# Patient Record
Sex: Male | Born: 1940 | Race: White | Hispanic: No | Marital: Single | Smoking: Never smoker
Health system: Southern US, Community
[De-identification: ages and names within clinical notes are randomized; demographics above are authoritative.]

## PROBLEM LIST (undated history)

## (undated) DIAGNOSIS — I10 Essential (primary) hypertension: Secondary | ICD-10-CM

## (undated) DIAGNOSIS — M069 Rheumatoid arthritis, unspecified: Secondary | ICD-10-CM

## (undated) DIAGNOSIS — E079 Disorder of thyroid, unspecified: Secondary | ICD-10-CM

## (undated) DIAGNOSIS — M1990 Unspecified osteoarthritis, unspecified site: Secondary | ICD-10-CM

## (undated) DIAGNOSIS — K219 Gastro-esophageal reflux disease without esophagitis: Secondary | ICD-10-CM

## (undated) DIAGNOSIS — E785 Hyperlipidemia, unspecified: Secondary | ICD-10-CM

## (undated) HISTORY — DX: Rheumatoid arthritis, unspecified: M06.9

## (undated) HISTORY — DX: Gastro-esophageal reflux disease without esophagitis: K21.9

## (undated) HISTORY — PX: CARDIAC SURGERY: SHX584

## (undated) HISTORY — DX: Unspecified osteoarthritis, unspecified site: M19.90

---

## 1985-10-26 HISTORY — PX: TYMPANOPLASTY: SHX33

## 1987-10-27 HISTORY — PX: OTHER SURGICAL HISTORY: SHX169

## 2008-03-28 ENCOUNTER — Ambulatory Visit: Payer: Self-pay | Admitting: Emergency Medicine

## 2008-03-30 ENCOUNTER — Ambulatory Visit: Payer: Self-pay | Admitting: Family Medicine

## 2012-02-01 ENCOUNTER — Ambulatory Visit: Payer: Self-pay

## 2014-10-26 HISTORY — PX: SHOULDER SURGERY: SHX246

## 2015-06-05 ENCOUNTER — Encounter: Payer: Self-pay | Admitting: Emergency Medicine

## 2015-06-05 ENCOUNTER — Ambulatory Visit
Admission: EM | Admit: 2015-06-05 | Discharge: 2015-06-05 | Disposition: A | Discharge status: Home / Self Care | Payer: Medicare PPO | Attending: Internal Medicine | Admitting: Internal Medicine

## 2015-06-05 DIAGNOSIS — S61210A Laceration without foreign body of right index finger without damage to nail, initial encounter: Secondary | ICD-10-CM | POA: Diagnosis not present

## 2015-06-05 HISTORY — DX: Disorder of thyroid, unspecified: E07.9

## 2015-06-05 HISTORY — DX: Hyperlipidemia, unspecified: E78.5

## 2015-06-05 HISTORY — DX: Essential (primary) hypertension: I10

## 2015-06-05 MED ORDER — TETANUS-DIPHTH-ACELL PERTUSSIS 5-2.5-18.5 LF-MCG/0.5 IM SUSP
0.5000 mL | Freq: Once | INTRAMUSCULAR | Status: AC
  Administered 2015-06-05: 0.5 mL via INTRAMUSCULAR

## 2015-06-05 MED ORDER — LIDOCAINE-EPINEPHRINE-TETRACAINE (LET) SOLUTION: 3.0000 mL | Freq: Once | NASAL | Status: DC

## 2015-06-05 NOTE — ED Notes (Signed)
Right index finger cut today.

## 2015-06-05 NOTE — ED Provider Notes (Signed)
CSN: [id number]     Arrival date & time 06/05/15  1821 History   First MD Initiated Contact with Patient 06/05/15 1911     Chief Complaint  Patient presents with  . Finger Injury   HPI  73yo patient presents with distal R 2nd finger avulsion injury, was cutting cucumbers with a mandolin to make pickles.  Also inadvertently removed some of distal R 3rd fingernail. Has been holding pressure; bleeding has slowed.  Past Medical History  Diagnosis Date  . Hypertension   . Hyperlipemia   . Thyroid disease    Past Surgical History  Procedure Laterality Date  . Cardiac surgery    . Tympanoplasty Right   . Vocal cords      Social History  Substance Use Topics  . Smoking status: Never Smoker   . Smokeless tobacco: Never Used  . Alcohol Use: 0.6 oz/week    1 Glasses of wine per week    Review of Systems  All other systems reviewed and are negative.   Allergies  Prednisone  Home Medications    BP 142/72 mmHg  Pulse 82  Temp(Src) 97.8 F (36.6 C) (Tympanic)  Resp 18  Ht 6\' 3"  (1.905 m)  Wt 205 lb (92.987 kg)  BMI 25.62 kg/m2  SpO2 96% Physical Exam  Constitutional: He is oriented to person, place, and time. No distress.  Alert, nicely groomed  HENT:  Head: Atraumatic.  Eyes:  Conjugate gaze, no eye redness/drainage  Neck: Neck supple.  Cardiovascular: Normal rate.   Pulmonary/Chest: No respiratory distress.  Abdominal: Soft. He exhibits no distension.  Musculoskeletal: Normal range of motion.  Neurological: He is alert and oriented to person, place, and time.  Skin: Skin is warm and dry.  No cyanosis Distal right finger tip with moderate avulsion, no exposed bone, surface is bleeding but not briskly, size of avulsion is about 4 x 8 mm, avulsion is triangular in shape and abuts the nail, but does not involve it  Nursing note and vitals reviewed.   ED Course  Procedures  Medications  lidocaine-EPINEPHrine-tetracaine (LET) solution   Tdap (BOOSTRIX) injection  0.5 mL (0.5 mLs Intramuscular Given 06/05/15 1858)  Surgicel   Finger injury was cleaned and inspected.  LET applied to facilitate hemostasis; small piece of surgicel applied to wound after hemostasis was achieved.  Dry dressing applied by clinical staff.  MDM   1. Laceration of right index finger w/o foreign body w/o damage to nail, initial encounter    Leave dressing on until tomorrow. Wash gently with soap/water daily, cover with bandaid.   Recheck for increasing redness/swelling/pain/drainage, new fever >100.5.    Sherlene Shams, MD 06/05/15 [phone]

## 2015-06-05 NOTE — Discharge Instructions (Signed)
A tetanus booster was given at the urgent care today.  Finger Avulsion  When the tip of the finger is lost, a new nail may grow back if part of the fingernail is left. The new nail may be deformed. If just the tip of the finger is lost, no repair may be needed unless there is bone showing. If bone is showing, your caregiver may need to remove the protruding bone and put on a bandage. Your caregiver will do what is best for you. Most of the time when a fingertip is lost, the end will gradually grow back on and look fairly normal, but it may remain sensitive to pressure and temperature extremes for a long time. HOME CARE INSTRUCTIONS   Keep your hand elevated above your heart to relieve pain and swelling.  Keep your dressing dry and clean.  Change your bandage in 24 hours or as directed.  Only take over-the-counter or prescription medicines for pain, discomfort, or fever as directed by your caregiver.  See your caregiver as needed for problems. SEEK MEDICAL CARE IF:   You have increased pain, swelling, drainage, or bleeding.  You have a fever.  You have swelling that spreads from your finger and into your hand. Make sure to check to see if you need a tetanus booster. Document Released: 12/21/2001 Document Revised: 04/12/2012 Document Reviewed: 11/15/2008 [facility] Patient Information 2015 [place], [place]. This information is not intended to replace advice given to you by your health care provider. Make sure you discuss any questions you have with your health care provider.

## 2015-08-19 ENCOUNTER — Ambulatory Visit
Admission: EM | Admit: 2015-08-19 | Discharge: 2015-08-19 | Disposition: A | Discharge status: Home / Self Care | Payer: Worker's Compensation | Attending: Family Medicine | Admitting: Family Medicine

## 2015-08-19 ENCOUNTER — Ambulatory Visit (INDEPENDENT_AMBULATORY_CARE_PROVIDER_SITE_OTHER): Payer: Worker's Compensation

## 2015-08-19 ENCOUNTER — Encounter: Payer: Self-pay | Admitting: Emergency Medicine

## 2015-08-19 DIAGNOSIS — S43004A Unspecified dislocation of right shoulder joint, initial encounter: Secondary | ICD-10-CM

## 2015-08-19 DIAGNOSIS — S61212A Laceration without foreign body of right middle finger without damage to nail, initial encounter: Secondary | ICD-10-CM

## 2015-08-19 DIAGNOSIS — S60519A Abrasion of unspecified hand, initial encounter: Secondary | ICD-10-CM

## 2015-08-19 IMAGING — CR DG SHOULDER 2+V*R*
3 series · 3 of 3 positions shown · non-contrast
Comparison: None

CLINICAL DATA: Pt fell walking down wooded trail this am and
slipped on acorns injuring rt shoulder/ humerus. Most pain whole arm
but more mid post humerus

EXAM:
RIGHT SHOULDER - 2+ VIEW

[shoulder axial (1 of 3)]
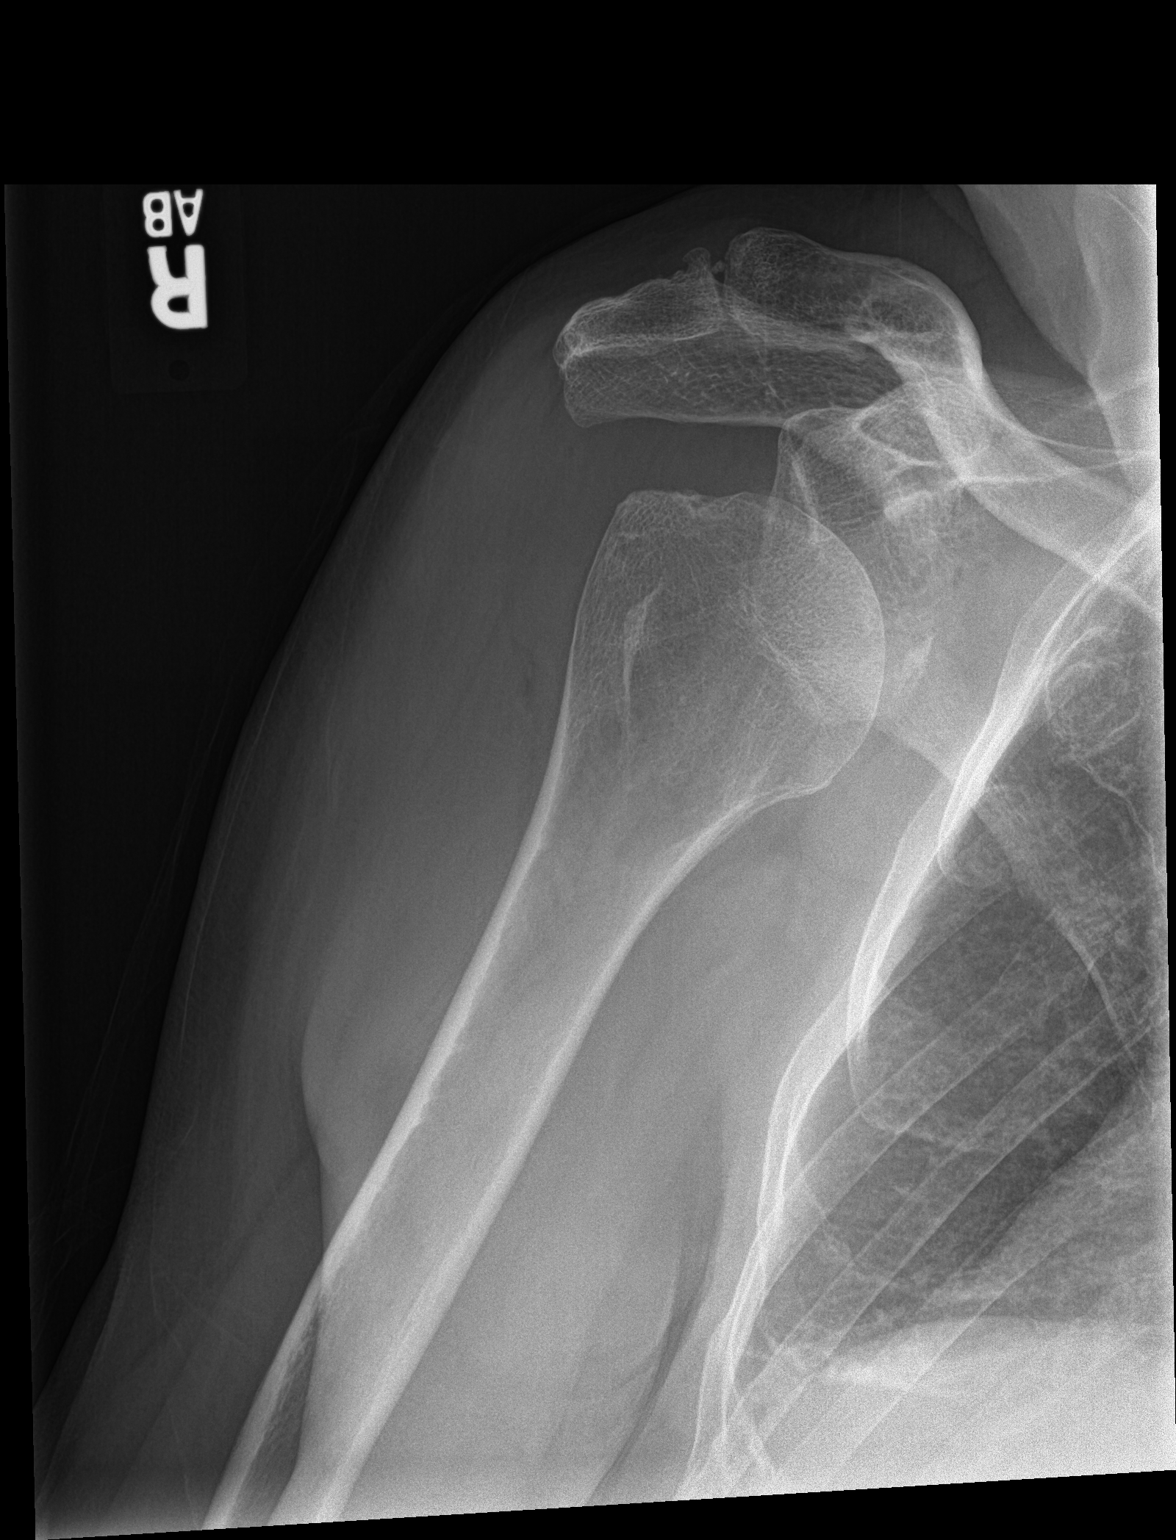

[shoulder axial (2 of 3)]
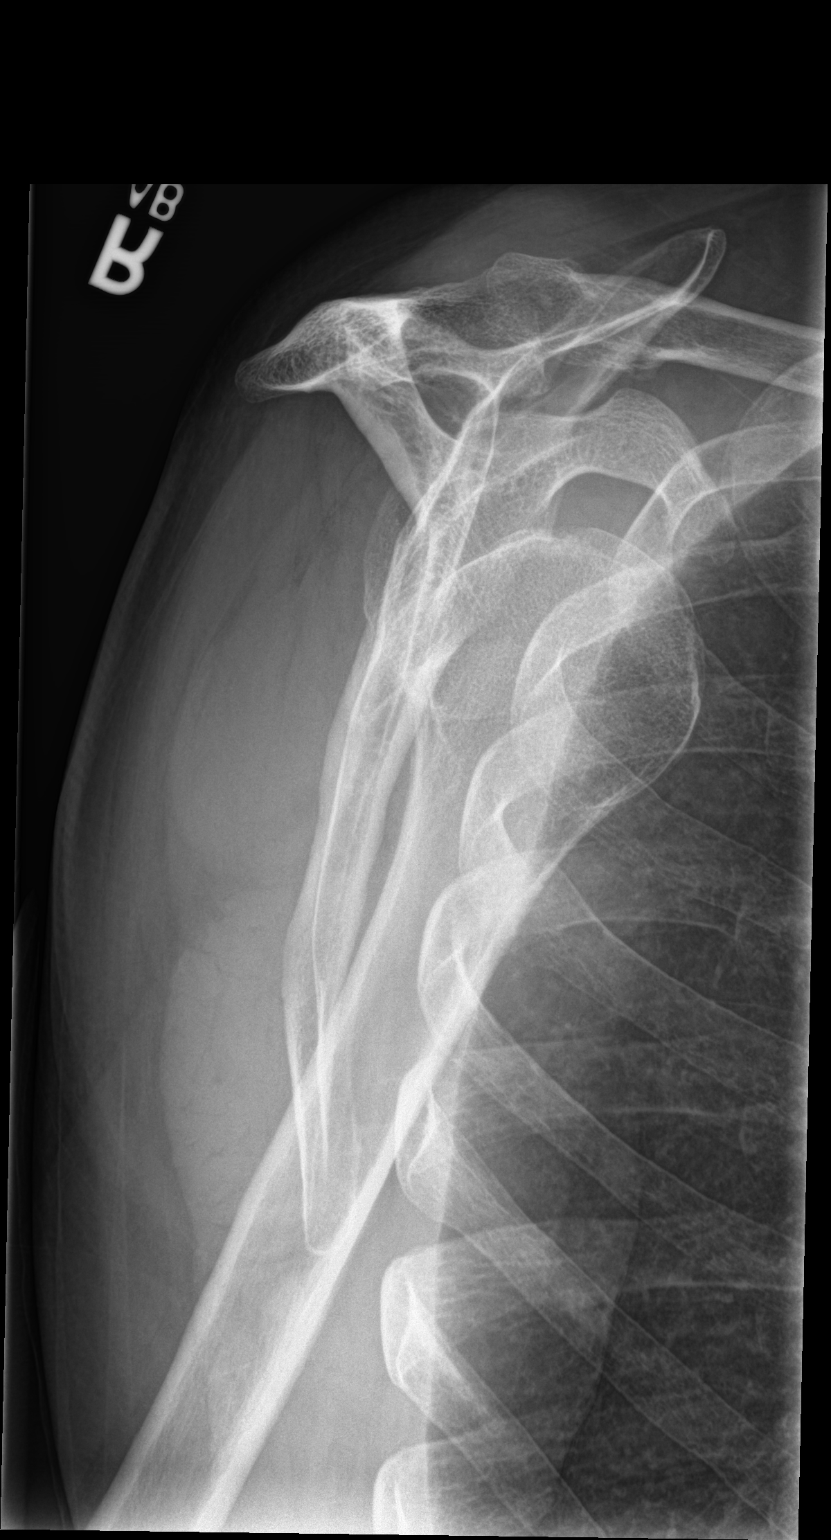

[shoulder axial (3 of 3)]
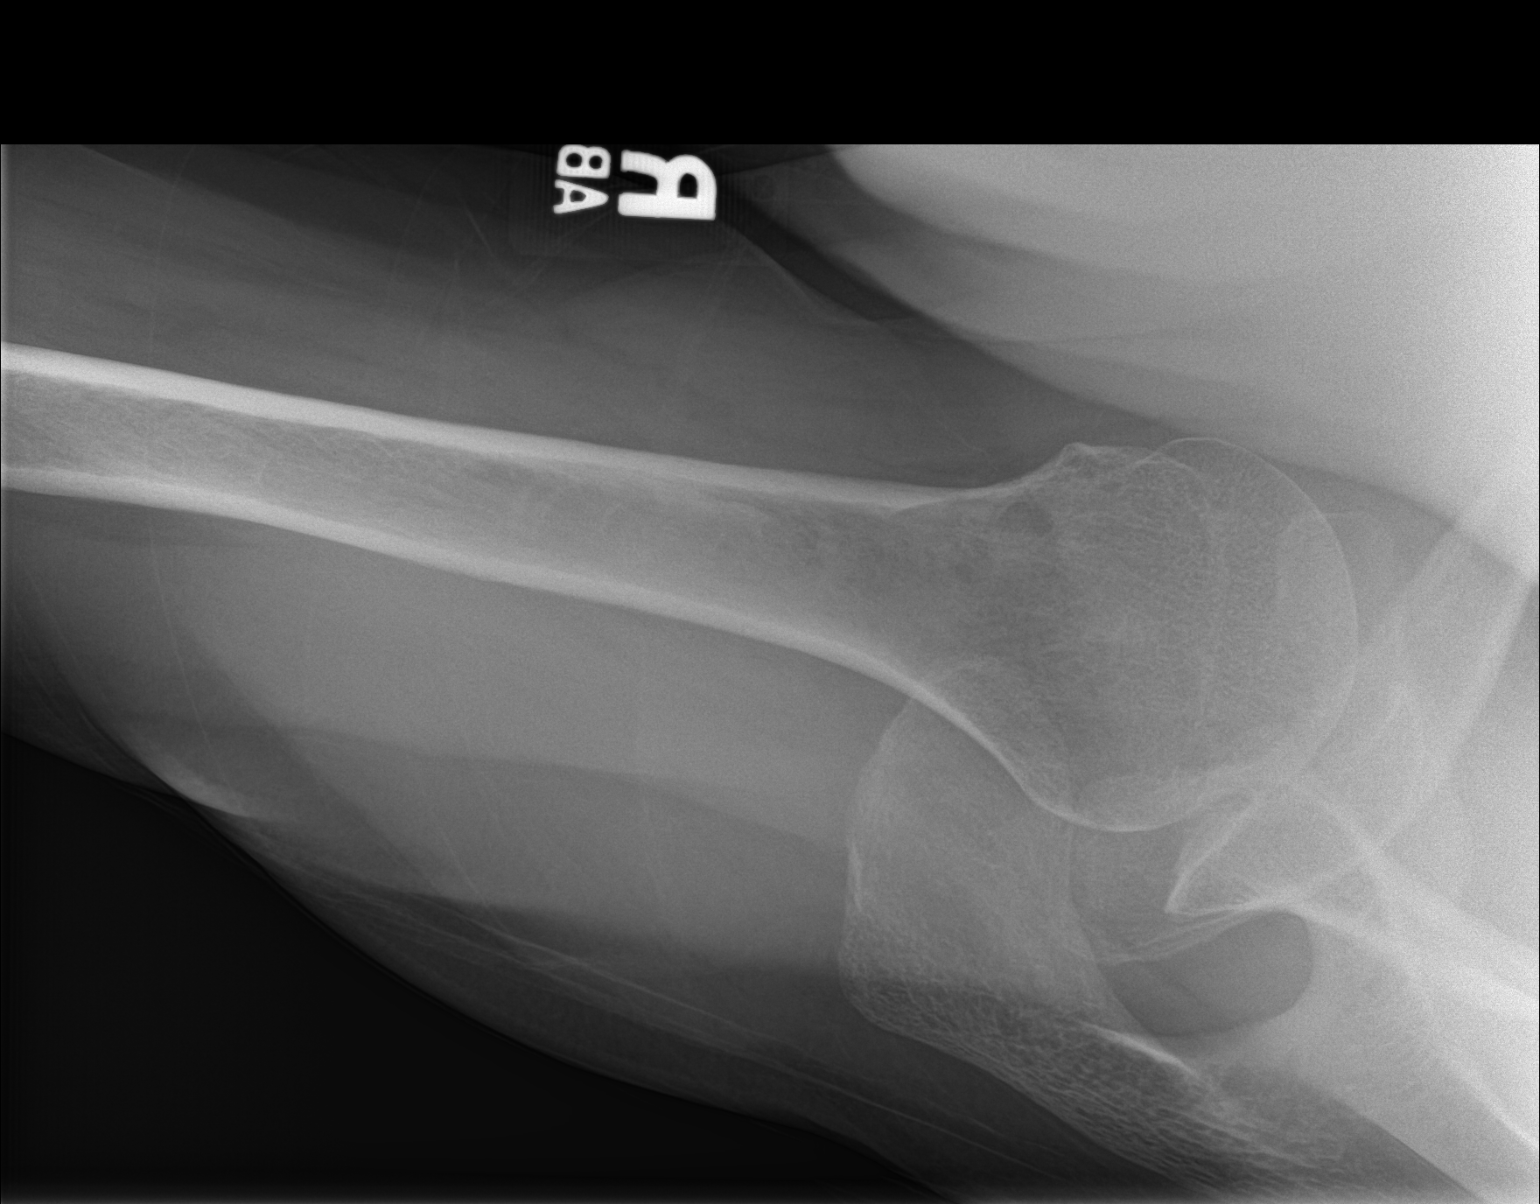

[3 of 3 positions shown; findings below may reference images not displayed]

FINDINGS: Right shoulder is dislocated. Humeral head is dislocated anterior
and slightly inferior in relation to the glenoid. No fracture is
seen.

AC joint is normally aligned. There are mild AC joint osteoarthritic
changes.

Soft tissues are unremarkable.
IMPRESSION: 1. Anterior inferior dislocation right humeral head.
2. No fracture.

## 2015-08-19 IMAGING — CR DG HUMERUS 2V *R*
4 series · 4 of 4 positions shown · non-contrast
Comparison: None.

CLINICAL DATA: Right mid upper arm pain following a fall on a trail
in the woods this morning.

EXAM:
RIGHT HUMERUS - 2+ VIEW

[humerus ap (1 of 2)]
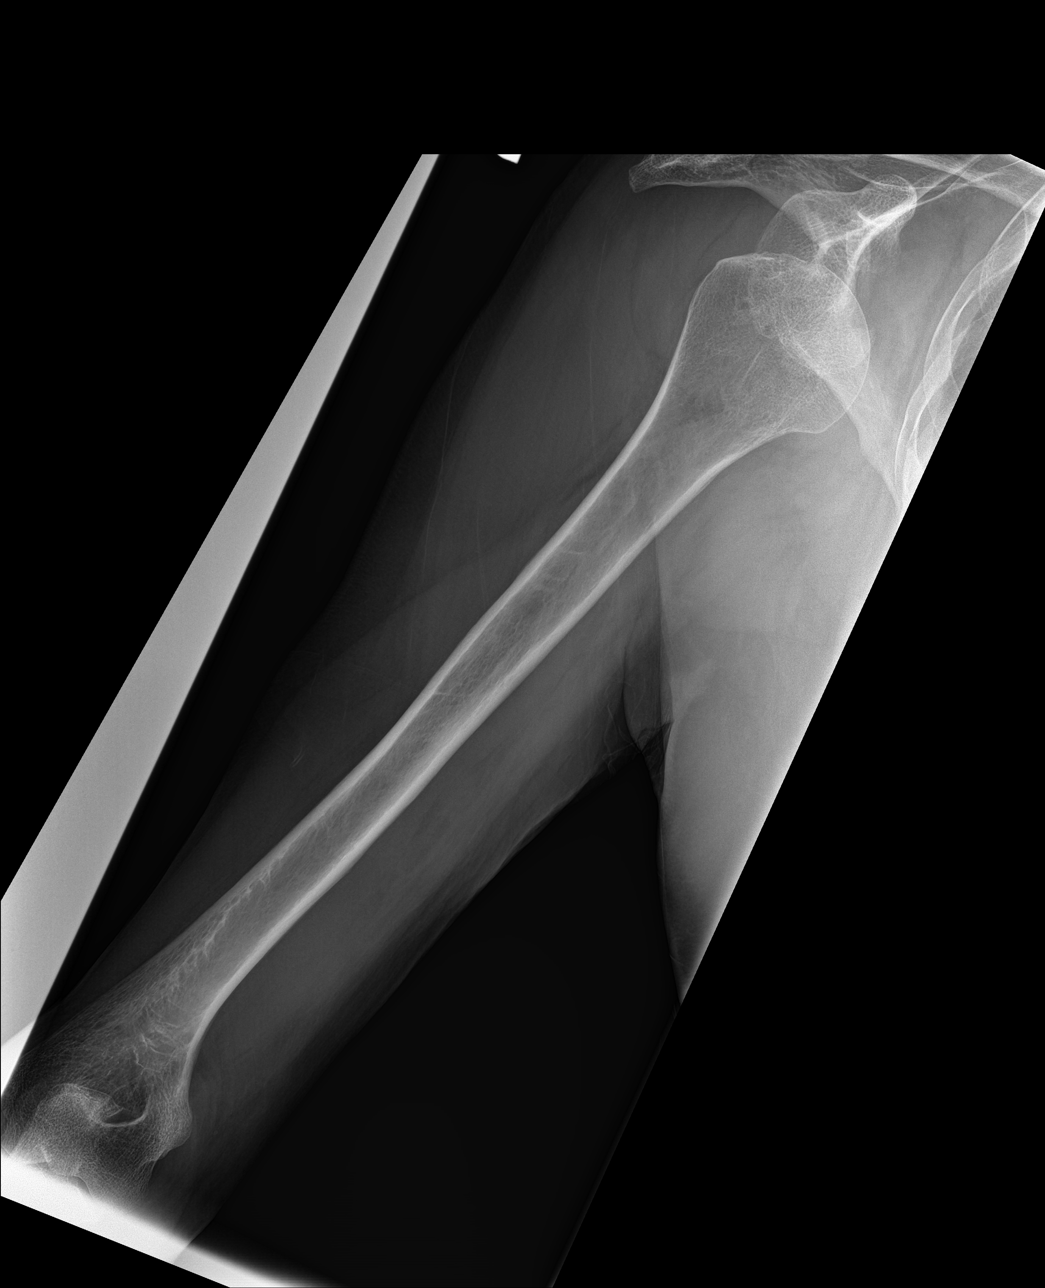

[humerus lat (1 of 2)]
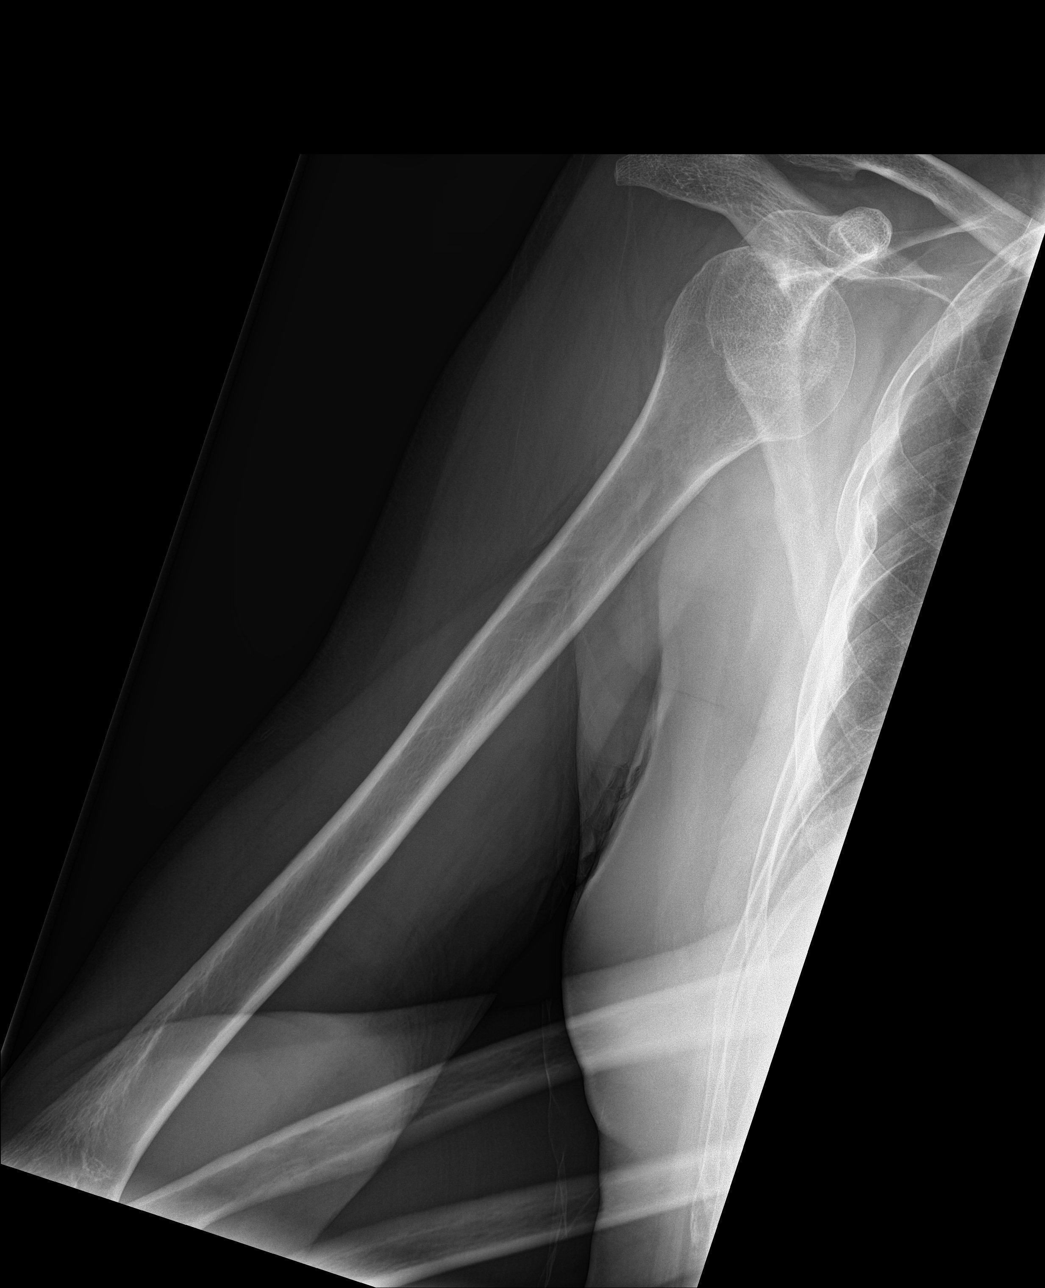

[humerus ap (2 of 2)]
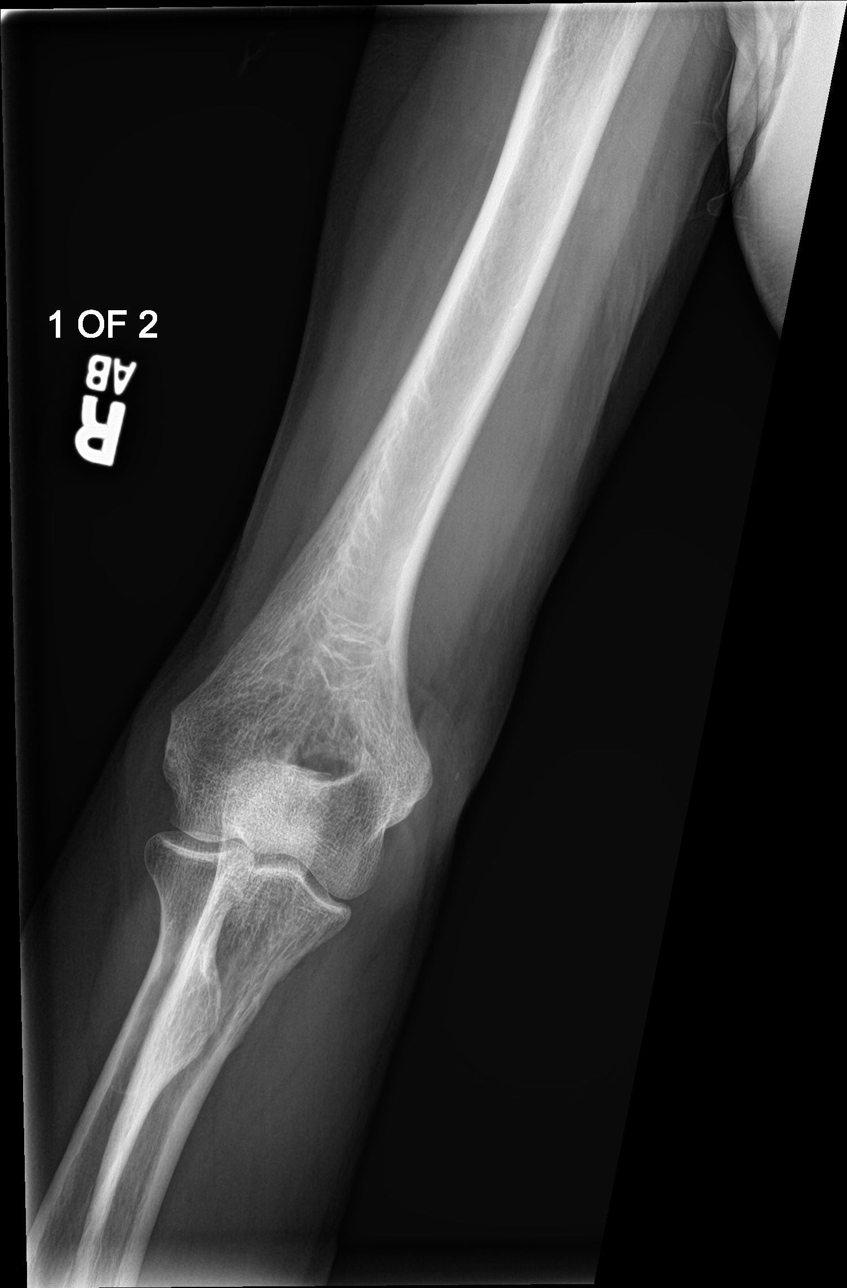

[humerus lat (2 of 2)]
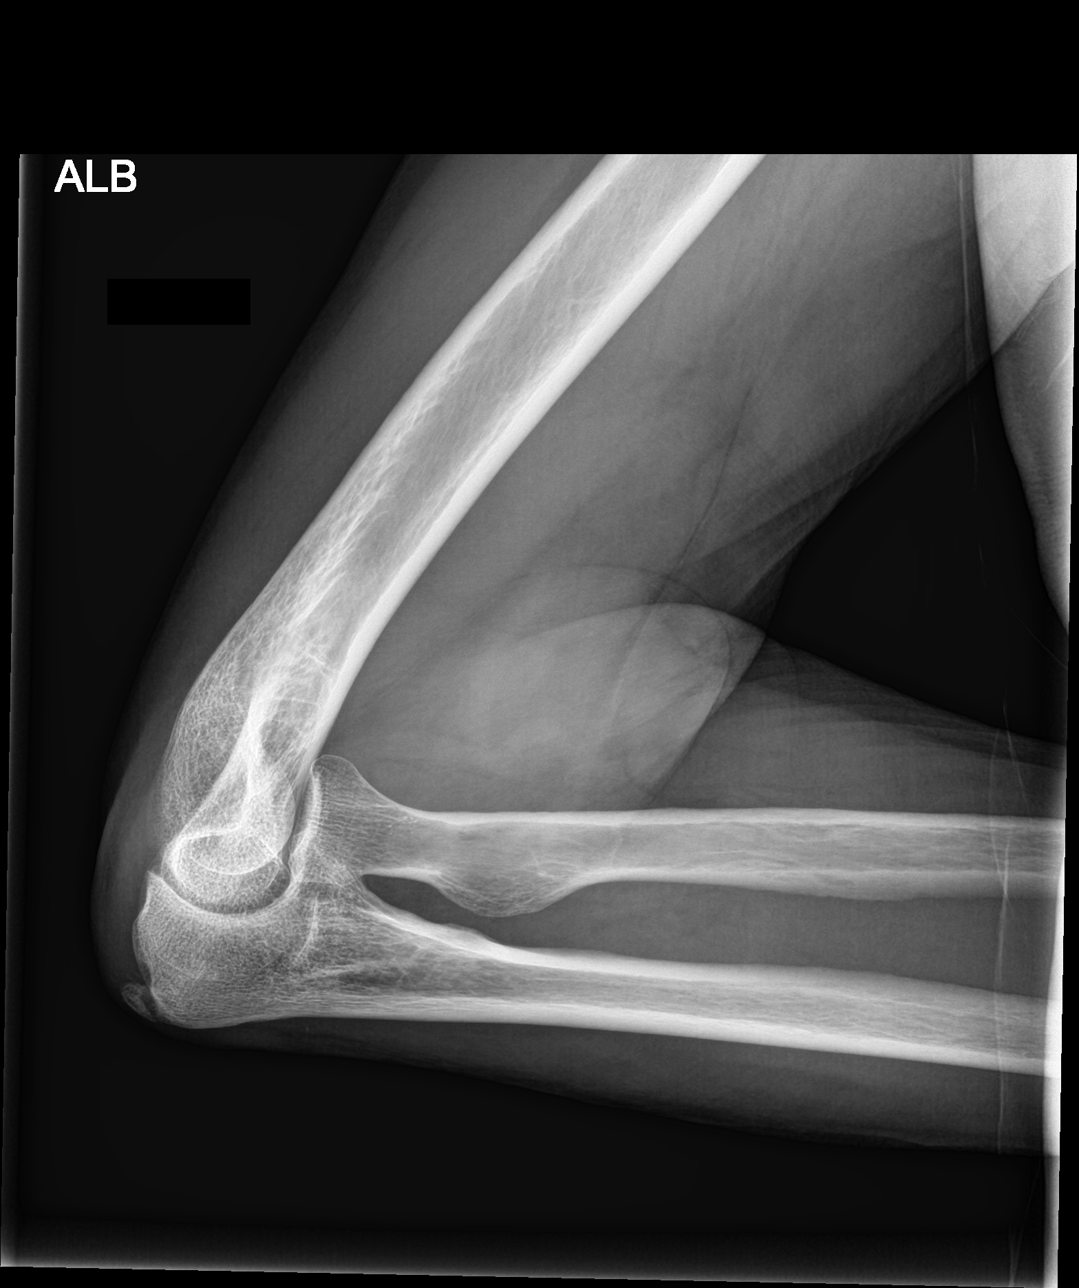

[4 of 4 positions shown; findings below may reference images not displayed]

FINDINGS: The humeral head is inferiorly and medially positioned relative to
the glenoid. No fracture seen.
IMPRESSION: Findings compatible with anterior shoulder dislocation. Dedicated
right shoulder radiographs are recommended.

## 2015-08-19 MED ORDER — KETOROLAC TROMETHAMINE 60 MG/2ML IM SOLN
60.0000 mg | Freq: Once | INTRAMUSCULAR | Status: AC
  Administered 2015-08-19: 60 mg via INTRAMUSCULAR

## 2015-08-19 NOTE — ED Notes (Signed)
Pt with a fall abrasions to hands and pain right u8pper arm

## 2015-08-19 NOTE — ED Provider Notes (Signed)
CSN: [id number]     Arrival date & time 08/19/15  4235 History   First MD Initiated Contact with Patient 08/19/15 1038   Nurses notes were reviewed. Chief Complaint  Patient presents with  . Abrasion   patient reports that he fell this morning going into work he placed in after he fell. States he found a: On the walkway landed on both hands and has pain in his right shoulder though initially he just complained about the abrasions on his hands.  (Consider location/radiation/quality/duration/timing/severity/associated sxs/prior Treatment) Patient is a 74 y.o. male presenting with shoulder pain and hand injury. The history is provided by the patient. No language interpreter was used.  Shoulder Pain Location:  Shoulder and hand Time since incident:  2 hours Injury: yes   Mechanism of injury: fall   Fall:    Fall occurred:  Walking and tripped   [employer] of impact:  Outstretched arms   Entrapped after fall: no   Shoulder location:  R shoulder Hand location:  R hand and L hand Pain details:    Quality:  Shooting, throbbing and sharp   Radiates to:  Does not radiate   Severity:  Moderate   Timing:  Constant   Progression:  Worsening Chronicity:  New Handedness:  Right-handed Dislocation: no   Foreign body present:  No foreign bodies Tetanus status:  Up to date Prior injury to area:  Yes Relieved by:  Nothing Worsened by:  Movement Ineffective treatments:  None tried Associated symptoms: decreased range of motion, stiffness and swelling   Risk factors: no concern for non-accidental trauma   Hand Injury Location:  Hand Hand location:  R palm and L palm Pain details:    Quality:  Aching and throbbing   Radiates to:  Does not radiate   Onset quality:  Sudden   Progression:  Unchanged Chronicity:  New Handedness:  Right-handed Tetanus status:  Up to date Relieved by:  Nothing Ineffective treatments:  None tried Associated symptoms: decreased range of motion, stiffness and swelling      Past Medical History  Diagnosis Date  . Hypertension   . Hyperlipemia   . Thyroid disease    Past Surgical History  Procedure Laterality Date  . Cardiac surgery    . Tympanoplasty Right   . Vocal cords     History reviewed. No pertinent family history. Social History  Substance Use Topics  . Smoking status: Never Smoker   . Smokeless tobacco: Never Used  . Alcohol Use: 0.6 oz/week    1 Glasses of wine per week    Review of Systems  Musculoskeletal: Positive for stiffness. Negative for myalgias.  Skin: Positive for wound.  Neurological: Negative for dizziness and facial asymmetry.  All other systems reviewed and are negative.   Allergies  Prednisone  Home Medications   Prior to Admission medications   Medication Sig Start Date End Date Taking? Authorizing Provider  allopurinol (ZYLOPRIM) 300 MG tablet Take 300 mg by mouth daily.    Historical Provider, MD  amLODipine-benazepril (LOTREL) 10-20 MG per capsule Take 1 capsule by mouth daily.    Historical Provider, MD  citalopram (CELEXA) 10 MG tablet Take 10 mg by mouth daily.    Historical Provider, MD  levothyroxine (SYNTHROID, LEVOTHROID) 50 MCG tablet Take 50 mcg by mouth daily before breakfast.    Historical Provider, MD  pravastatin (PRAVACHOL) 20 MG tablet Take 20 mg by mouth daily.    Historical Provider, MD   Meds Ordered and Administered this  Visit   Medications  ketorolac (TORADOL) injection 60 mg (not administered)    BP 136/67 mmHg  Pulse 69  Temp(Src) 98.6 F (37 C) (Tympanic)  Resp 20  Ht 6\' 3"  (1.905 m)  Wt 198 lb (89.812 kg)  BMI 24.75 kg/m2  SpO2 99% No data found.   Physical Exam  Constitutional: He is oriented to person, place, and time. He appears well-developed.  HENT:  Head: Normocephalic and atraumatic.  Eyes: Pupils are equal, round, and reactive to light.  Musculoskeletal: He exhibits tenderness.       Right shoulder: He exhibits decreased range of motion, tenderness,  deformity, pain and spasm.       Arms:      Right hand: He exhibits laceration.       Left hand: He exhibits tenderness.       Hands: Over the palmar surface of left hand and some abrasions present but good range of motion.  Neurological: He is alert and oriented to person, place, and time.  Skin: Skin is warm and dry.  Psychiatric: He has a normal mood and affect. His behavior is normal.  Vitals reviewed.   ED Course  Procedures (including critical care time)  Labs Review Labs Reviewed - No data to display  Imaging Review Dg Shoulder Right  08/19/2015  CLINICAL DATA:  Pt fell walking down wooded trail this am and slipped on acorns injuring rt shoulder/ humerus. Most pain whole arm but more mid post humerus EXAM: RIGHT SHOULDER - 2+ VIEW COMPARISON:  None FINDINGS: Right shoulder is dislocated. Humeral head is dislocated anterior and slightly inferior in relation to the glenoid. No fracture is seen. AC joint is normally aligned. 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 Soft tissues are unremarkable. IMPRESSION: 1. Anterior inferior dislocation right humeral head. 2. No fracture. Electronically Signed   By: Lajean Manes M.D.   On: 08/19/2015 11:10   Dg Humerus Right  08/19/2015  CLINICAL DATA:  Right mid upper arm pain following a fall on a trail in the woods this morning. EXAM: RIGHT HUMERUS - 2+ VIEW COMPARISON:  None. FINDINGS: The humeral head is inferiorly and medially positioned relative to the glenoid. No fracture seen. IMPRESSION: Findings compatible with anterior shoulder dislocation. Dedicated right shoulder radiographs are recommended. Electronically Signed   By: Claudie Revering M.D.   On: 08/19/2015 10:58     Visual Acuity Review  Right Eye Distance:   Left Eye Distance:   Bilateral Distance:    Right Eye Near:   Left Eye Near:    Bilateral Near:         MDM   1. Shoulder dislocation, right, initial encounter   2. Abrasion of hand, unspecified laterality,  initial encounter   3. Laceration of middle finger of right hand without complication, initial encounter    Reviewing patient's x-rays shows on the humerus possible dislocation. Orders were placed for complete shoulder. Radiologist also agreed that the right shoulder is dislocated. Patient will be sent to you ED his choices [facility] where he's had multiple treatments before. One of his coworkers/supervisors is agreed to dry him once he checks the workplace and gets clearance. Will Mr. 44 Toradol for pain. Place him in a sling. Copies of his disc were also given to him as well. He states his last tetanus was about a year ago which she thinks was here. He also has abrasions on his hand and a laceration on his middle finger which will be  deferred to the ED as well.    Frederich Cha, MD 08/19/15 [phone]

## 2015-08-19 NOTE — Discharge Instructions (Signed)
Shoulder Dislocation Your shoulder joint is made up of 3 bones:  The upper arm bone (humerus).  The shoulder blade (scapula).  The collarbone (clavicle). A shoulder dislocation happens when your upper arm bone moves out of its normal place in your shoulder joint. HOME CARE If You Have a Splint or Sling:  Wear it as told by your doctor.  Take it off only as told by your doctor.  Loosen it if:  Your fingers become numb and tingly.  Your fingers turn cold and blue.  Keep it clean and dry. Bathing  Do not take baths, swim, or use a hot tub until your doctor says you can. Ask your doctor if you can take showers. You may only be allowed to take sponge baths.  If your doctor says taking baths or showers is okay, cover your splint or sling with a plastic bag. Do not let the splint or sling get wet. Managing Pain, Stiffness, and Swelling  If told, put ice on the injured area.  Put ice in a plastic bag.  Place a towel between your skin and the bag.  Leave the ice on for 20 minutes, 2-3 times per day.  Move your fingers often to avoid stiffness and to lessen swelling.  Raise (elevate) the injured area above the level of your heart while you are sitting or lying down. Driving  Do not drive while you are wearing a splint or sling on a hand that you use for driving.  Do not drive or operate heavy machinery while taking pain medicine. Activity  Return to your normal activities as told by your doctor. Ask your doctor what activities are safe for you.  Do range-of-motion exercises only as told by your doctor.  Exercise your hand by squeezing a soft ball. This keeps your hand and wrist from getting stiff and swollen. General Instructions  Take over-the-counter and prescription medicines only as told by your doctor.  Do not use any tobacco products, including cigarettes, chewing tobacco, or e-cigarettes. Tobacco can slow down healing. If you need help quitting, ask your  doctor.  Keep all follow-up visits as told by your doctor. This is important. GET HELP IF:  Your splint or sling gets damaged. GET HELP RIGHT AWAY IF:  Your pain gets worse instead of better.  You lose feeling in your arm or hand.  Your arm or hand turns white and cold.   This information is not intended to replace advice given to you by your health care provider. Make sure you discuss any questions you have with your health care provider.   Document Released: 01/04/2012 Document Revised: 07/03/2015 Document Reviewed: 02/04/2015 Elsevier Interactive Patient Education [employer].

## 2016-02-07 ENCOUNTER — Emergency Department: Payer: Medicare PPO

## 2016-02-07 ENCOUNTER — Encounter: Payer: Self-pay | Admitting: Emergency Medicine

## 2016-02-07 ENCOUNTER — Emergency Department
Admission: EM | Admit: 2016-02-07 | Discharge: 2016-02-07 | Disposition: A | Discharge status: Home / Self Care | Payer: Medicare PPO | Attending: Emergency Medicine | Admitting: Emergency Medicine

## 2016-02-07 ENCOUNTER — Ambulatory Visit (INDEPENDENT_AMBULATORY_CARE_PROVIDER_SITE_OTHER)
Admission: EM | Admit: 2016-02-07 | Discharge: 2016-02-07 | Disposition: A | Discharge status: Other Acute Inpatient Hospital | Payer: Medicare PPO | Source: Home / Self Care | Attending: Family Medicine | Admitting: Family Medicine

## 2016-02-07 DIAGNOSIS — I10 Essential (primary) hypertension: Secondary | ICD-10-CM | POA: Insufficient documentation

## 2016-02-07 DIAGNOSIS — E785 Hyperlipidemia, unspecified: Secondary | ICD-10-CM | POA: Diagnosis not present

## 2016-02-07 DIAGNOSIS — E7800 Pure hypercholesterolemia, unspecified: Secondary | ICD-10-CM

## 2016-02-07 DIAGNOSIS — R079 Chest pain, unspecified: Secondary | ICD-10-CM

## 2016-02-07 DIAGNOSIS — Z8249 Family history of ischemic heart disease and other diseases of the circulatory system: Secondary | ICD-10-CM

## 2016-02-07 DIAGNOSIS — I2510 Atherosclerotic heart disease of native coronary artery without angina pectoris: Secondary | ICD-10-CM

## 2016-02-07 DIAGNOSIS — R0789 Other chest pain: Secondary | ICD-10-CM

## 2016-02-07 DIAGNOSIS — Z79899 Other long term (current) drug therapy: Secondary | ICD-10-CM | POA: Insufficient documentation

## 2016-02-07 DIAGNOSIS — E079 Disorder of thyroid, unspecified: Secondary | ICD-10-CM | POA: Insufficient documentation

## 2016-02-07 LAB — COMPREHENSIVE METABOLIC PANEL
ALT: 24 U/L (ref 17–63)
AST: 27 U/L (ref 15–41)
Albumin: 3.9 g/dL (ref 3.5–5.0)
Alkaline Phosphatase: 73 U/L (ref 38–126)
Anion gap: 7 (ref 5–15)
BUN: 13 mg/dL (ref 6–20)
CHLORIDE: 102 mmol/L (ref 101–111)
CO2: 25 mmol/L (ref 22–32)
CREATININE: 0.88 mg/dL (ref 0.61–1.24)
Calcium: 8.9 mg/dL (ref 8.9–10.3)
GFR calc non Af Amer: >60 mL/min (ref >60)
Glucose, Bld: 97 mg/dL (ref 65–99)
POTASSIUM: 4.3 mmol/L (ref 3.5–5.1)
Sodium: 134 mmol/L — ABNORMAL LOW (ref 135–145)
TOTAL PROTEIN: 7.4 g/dL (ref 6.5–8.1)
Total Bilirubin: 0.8 mg/dL (ref 0.3–1.2)

## 2016-02-07 LAB — FIBRIN DERIVATIVES D-DIMER (ARMC ONLY): FIBRIN DERIVATIVES D-DIMER (ARMC): 545 — AB (ref 0–499)

## 2016-02-07 LAB — CBC WITH DIFFERENTIAL/PLATELET
BASOS ABS: 0 10*3/uL (ref 0–0.1)
BASOS PCT: 0 %
EOS PCT: 1 %
Eosinophils Absolute: 0.1 10*3/uL (ref 0–0.7)
HCT: 39.7 % — ABNORMAL LOW (ref 40.0–52.0)
Hemoglobin: 13.1 g/dL (ref 13.0–18.0)
Lymphocytes Relative: 8 %
Lymphs Abs: 1.2 10*3/uL (ref 1.0–3.6)
MCH: 29.9 pg (ref 26.0–34.0)
MCHC: 33 g/dL (ref 32.0–36.0)
MCV: 90.6 fL (ref 80.0–100.0)
MONO ABS: 1.9 10*3/uL — AB (ref 0.2–1.0)
Monocytes Relative: 13 %
Neutro Abs: 11.6 10*3/uL — ABNORMAL HIGH (ref 1.4–6.5)
Neutrophils Relative %: 78 %
PLATELETS: 221 10*3/uL (ref 150–440)
RBC: 4.38 MIL/uL — ABNORMAL LOW (ref 4.40–5.90)
RDW: 15.7 % — AB (ref 11.5–14.5)
WBC: 14.8 10*3/uL — ABNORMAL HIGH (ref 3.8–10.6)

## 2016-02-07 LAB — TROPONIN I

## 2016-02-07 IMAGING — CT CT ANGIO CHEST
1 of 2 series · 18 of 30 positions shown · IV contrast (APPLIED)
Comparison: Chest radiograph earlier same day

CLINICAL DATA: Patient with chest pain and shortness of breath.

EXAM:
CT ANGIOGRAPHY CHEST WITH CONTRAST
TECHNIQUE: Multidetector CT imaging of the chest was performed using the
standard protocol during bolus administration of intravenous
contrast. Multiplanar CT image reconstructions and MIPs were
obtained to evaluate the vascular anatomy.
CONTRAST:  75 cc Isovue 370

[Series 5: pe 1.0 thins · axial · 0.82mm/px · z∈[-136,+130]mm · 18 of 301 slices shown]
[im 17/301  lung]
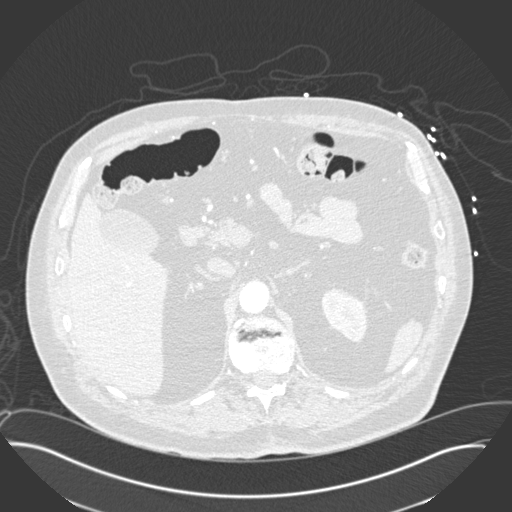
[im 34/301  mediastinal]
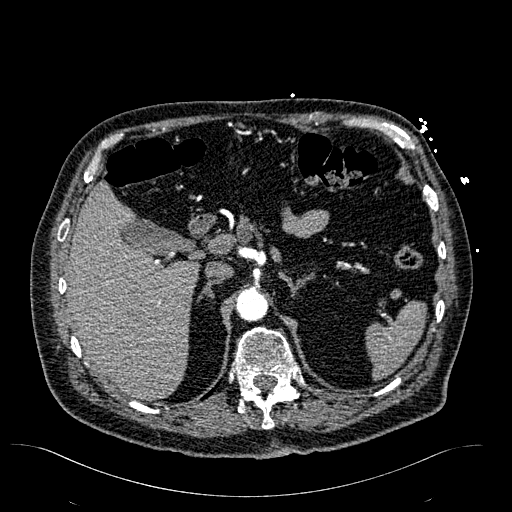
[im 51/301  lung]
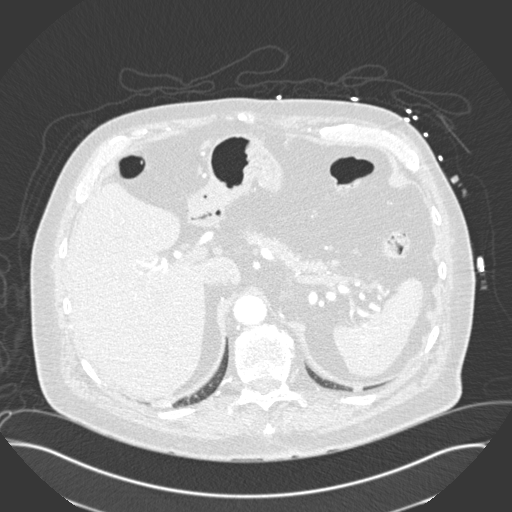
[im 67/301  mediastinal]
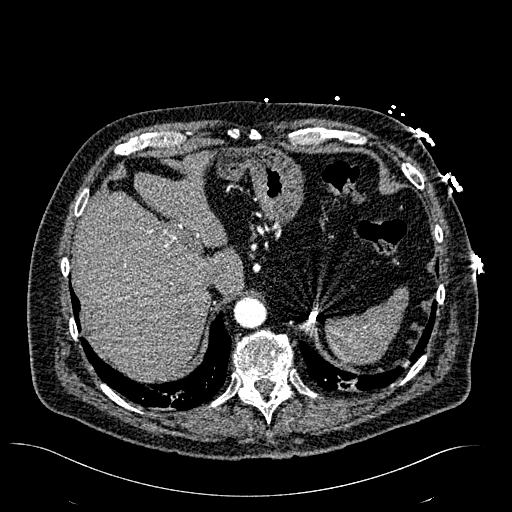
[im 84/301  lung]
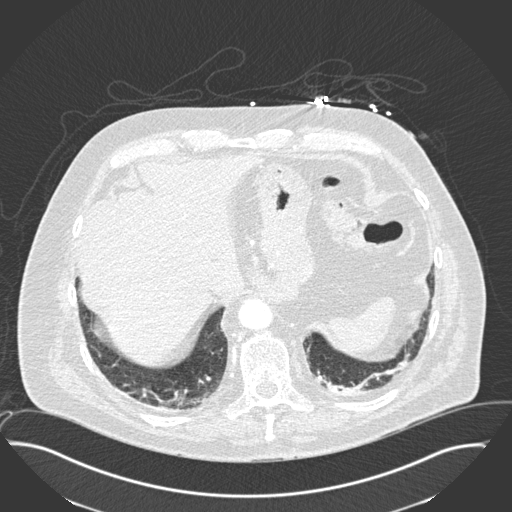
[im 101/301  mediastinal]
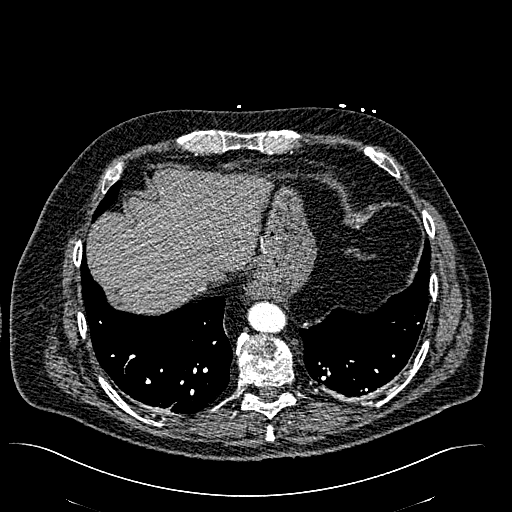
[im 117/301  lung]
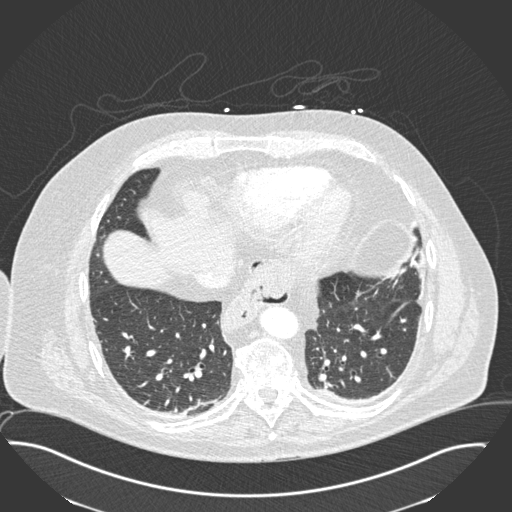
[im 134/301  mediastinal]
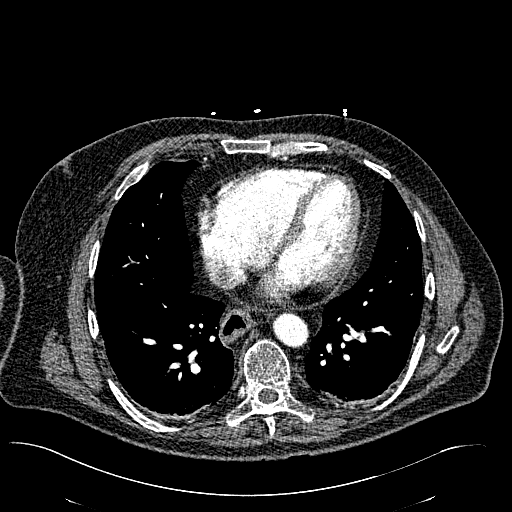
[im 141/301  lung]
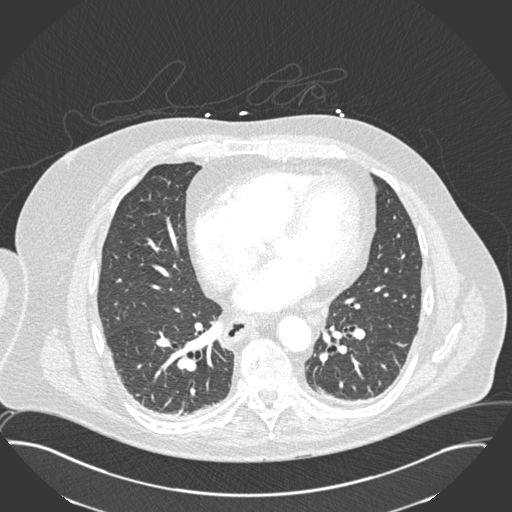
[im 151/301  mediastinal]
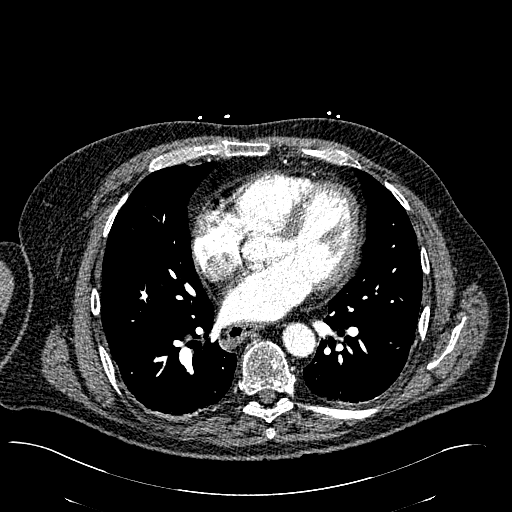
[im 167/301  lung]
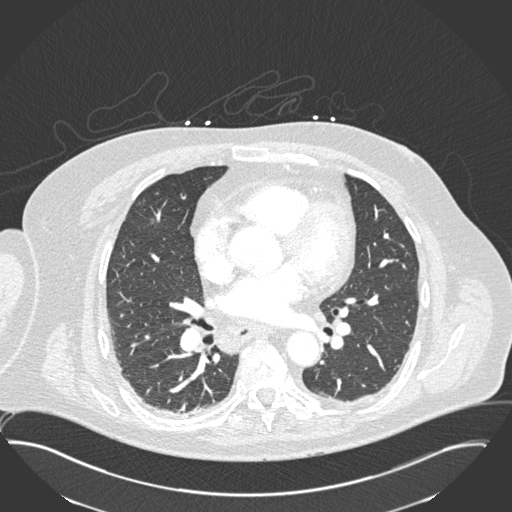
[im 184/301  mediastinal]
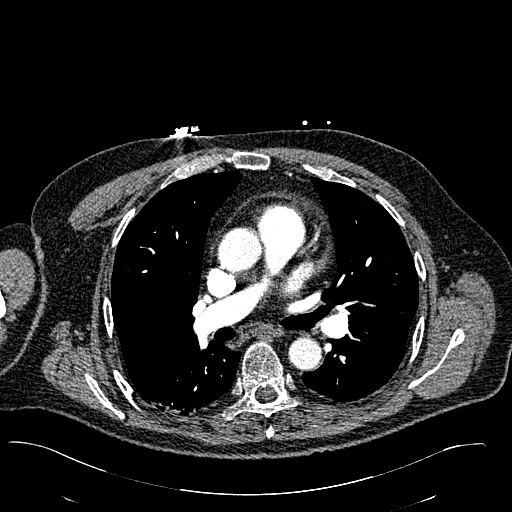
[im 201/301  lung]
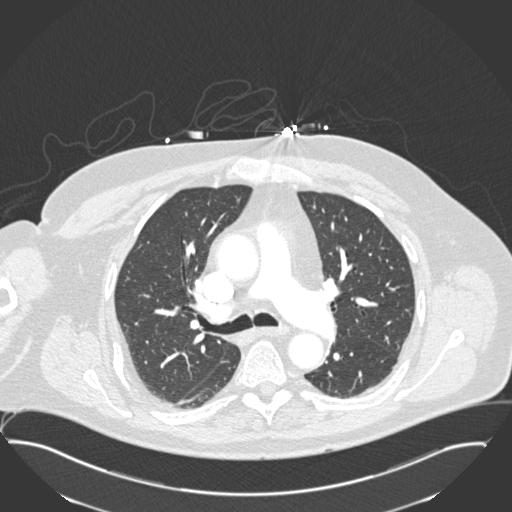
[im 217/301  mediastinal]
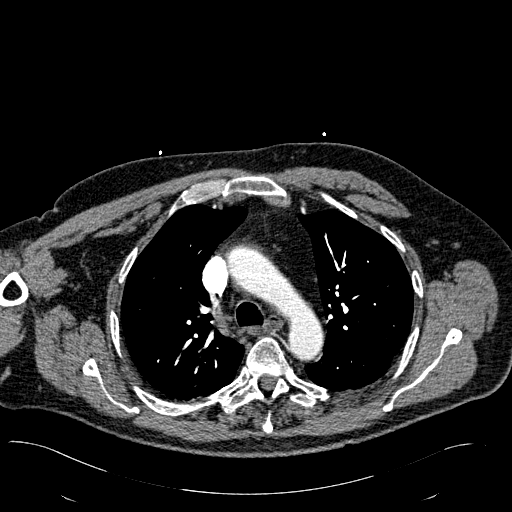
[im 234/301  lung]
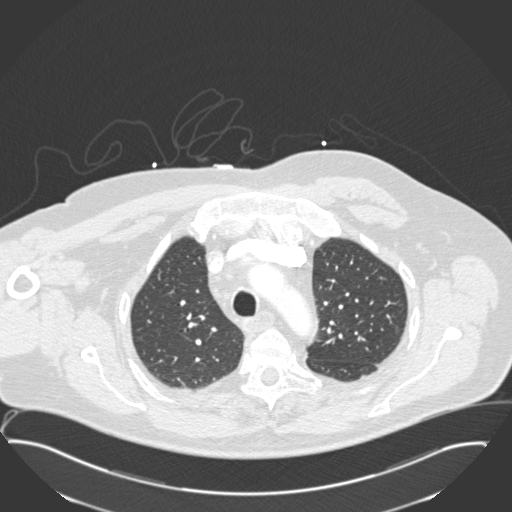
[im 251/301  mediastinal]
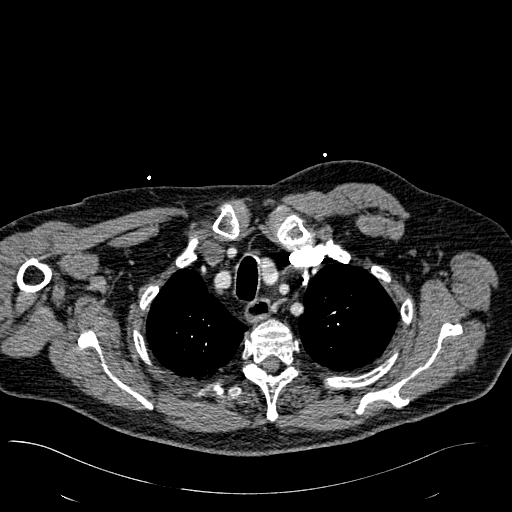
[im 267/301  lung]
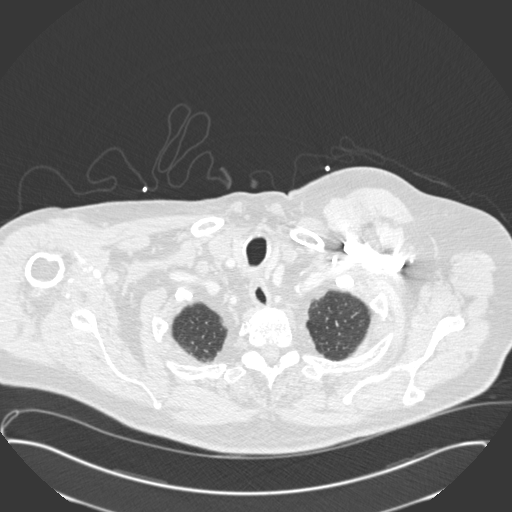
[im 284/301  mediastinal]
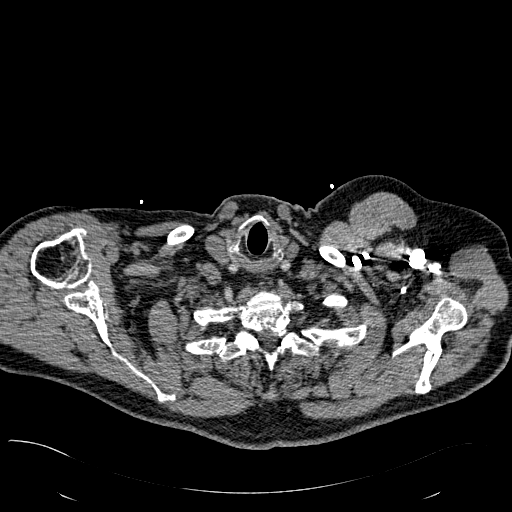

[18 of 30 positions shown; findings below may reference images not displayed]

FINDINGS: Mediastinum/Nodes: Visualized thyroid is unremarkable. No enlarged
axillary, mediastinal or hilar lymphadenopathy. Normal heart size.
No pericardial effusion. Aorta and main pulmonary artery normal in
caliber. Small hiatal hernia.

Adequate opacification of the pulmonary arterial system. No evidence
for pulmonary embolism.

Lungs/Pleura: The central airways are patent. Bandlike opacities
within the bilateral lower lobes. No large area of pulmonary
consolidation. No pleural effusion or pneumothorax.

Upper abdomen: The adrenal glands are normal. Along the undersurface
of the pancreatic head/uncinate process there is a 1.3 cm cystic
lesion (image 269; series 5).

Musculoskeletal: No aggressive or acute appearing osseous lesions
are identified.

Review of the MIP images confirms the above findings.
IMPRESSION: No evidence for pulmonary embolism.

Atelectasis and/or scarring within the bilateral lobes.

Indeterminate cystic lesion along the undersurface of the pancreatic
head/ uncinate process. Recommend follow-up pancreatic protocol MRI
in 12 months to demonstrate stability and for further
characterization.

## 2016-02-07 IMAGING — DX DG CHEST 1V PORT
1 series · 1 of 1 positions shown · non-contrast
Comparison: None.

CLINICAL DATA: Right-sided chest pain

EXAM:
PORTABLE CHEST 1 VIEW

[chest ap]
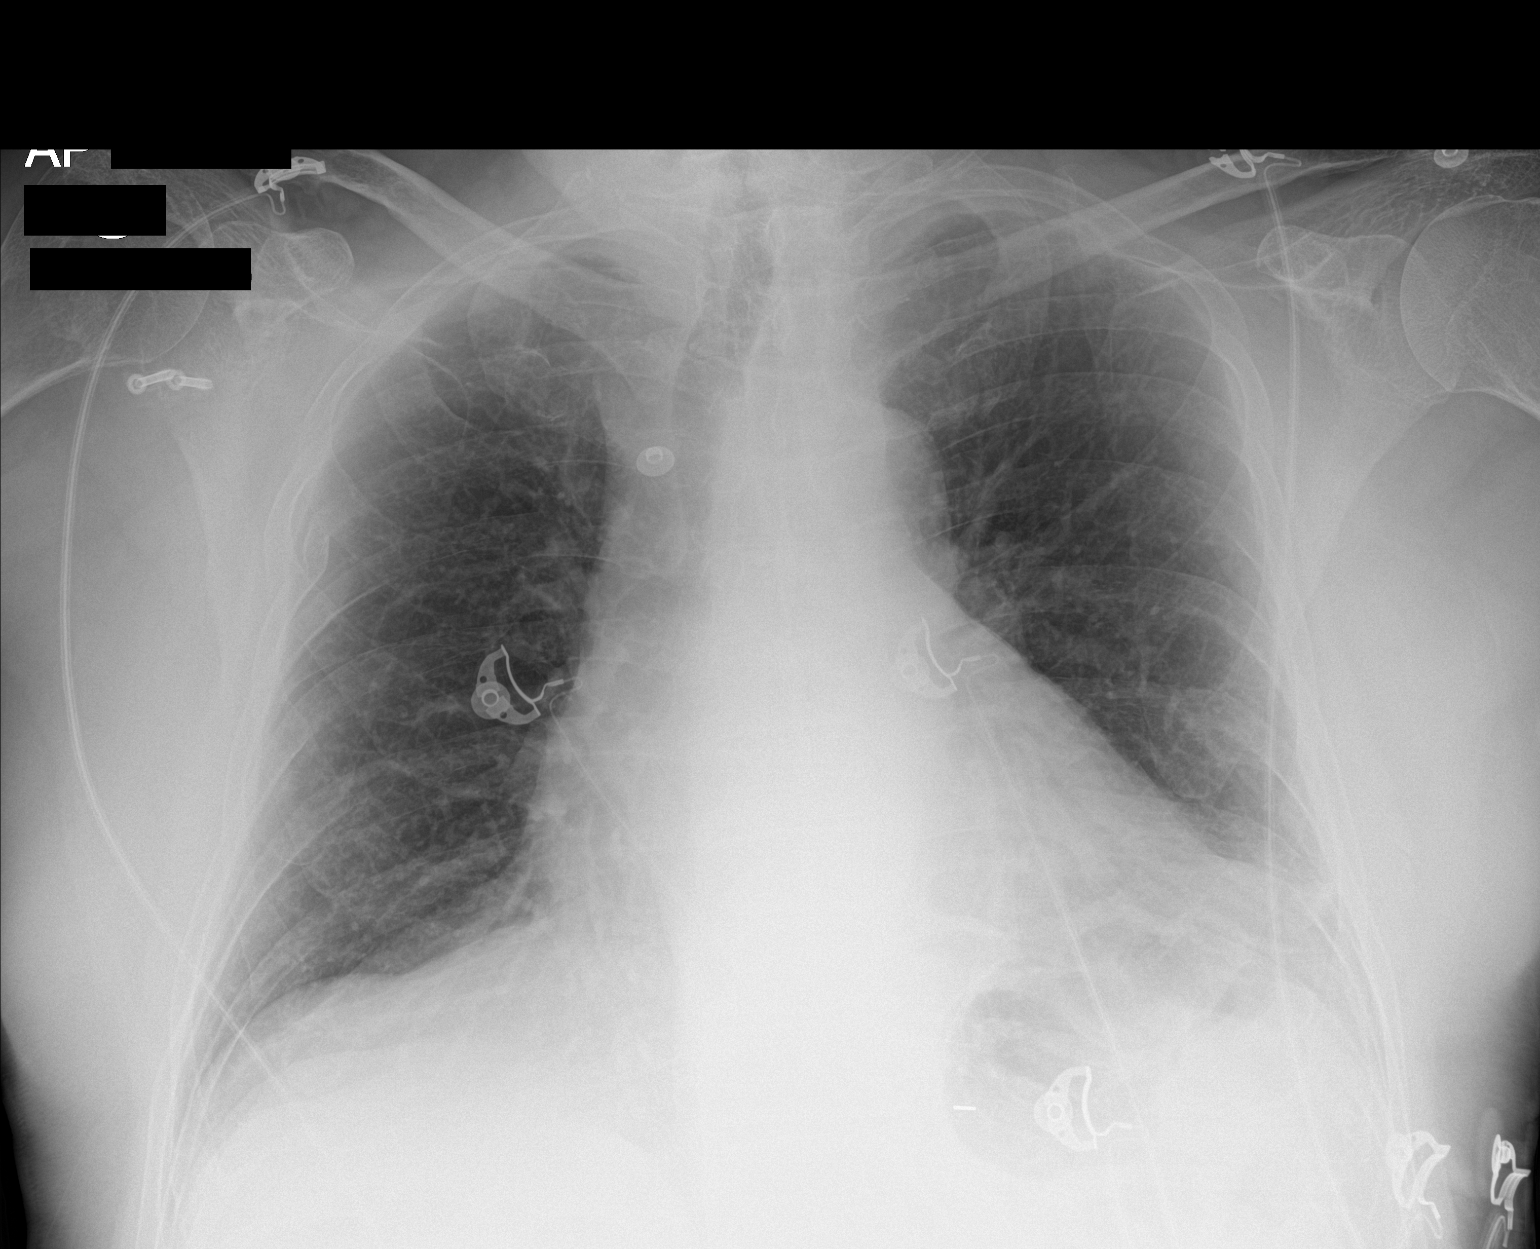

[1 of 1 positions shown; findings below may reference images not displayed]

FINDINGS: There is mild scarring in the left base. Lungs elsewhere clear.
Heart is mildly enlarged with pulmonary vascularity within normal
limits. No adenopathy. No bone lesions. There old healed rib
fractures on the right.
IMPRESSION: Scarring left base. No edema or consolidation. Old rib trauma on the
right. Heart slightly enlarged.

## 2016-02-07 MED ORDER — ASPIRIN 81 MG PO CHEW
324.0000 mg | CHEWABLE_TABLET | Freq: Once | ORAL | Status: AC
  Administered 2016-02-07: 324 mg via ORAL

## 2016-02-07 MED ORDER — IOPAMIDOL (ISOVUE-370) INJECTION 76%
75.0000 mL | Freq: Once | INTRAVENOUS | Status: AC | PRN
  Administered 2016-02-07: 75 mL via INTRAVENOUS

## 2016-02-07 MED ORDER — NITROGLYCERIN 2 % TD OINT
0.5000 [in_us] | TOPICAL_OINTMENT | Freq: Once | TRANSDERMAL | Status: AC
  Administered 2016-02-07: 0.5 [in_us] via TOPICAL

## 2016-02-07 NOTE — Discharge Instructions (Signed)
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

## 2016-02-07 NOTE — ED Notes (Signed)
Patient transported to CT 

## 2016-02-07 NOTE — ED Notes (Addendum)
Patient c/o chest pain and SOB that started last night and has not resolved.  Patient denies N/V.  Patient states he took 1 tablet of Oxycodone this morning and has not helped his pain.

## 2016-02-07 NOTE — ED Provider Notes (Signed)
[facility] Emergency Department Provider Note  ____________________________________________  Time seen: Approximately 12:45 PM  I have reviewed the triage vital signs and the nursing notes.   HISTORY  Chief Complaint Chest Pain   HPI Arthur Romero is a 75 y.o. male patient reports pain in the right side of the chest neck and shoulder starting last night seemed to come out of nowhere. Seems to get worse with deep breathing he says he had an oxycodone yesterday and it really didn't help came to the urgent care gets some aspirin and nitroglycerin that seemed to help a little bit the pain is tight in nature but again breathing seems to make it worse. Patient says he really hasn't had this pain before. He is a little bit short of breath with it but not nauseated or sweaty.  Past Medical History  Diagnosis Date  . Hypertension   . Hyperlipemia   . Thyroid disease     There are no active problems to display for this patient.   Past Surgical History  Procedure Laterality Date  . Cardiac surgery    . Tympanoplasty Right   . Vocal cords    . Shoulder surgery Right     Current Outpatient Rx  Name  Route  Sig  Dispense  Refill  . allopurinol (ZYLOPRIM) 300 MG tablet   Oral   Take 300 mg by mouth daily.         Marland Kitchen amLODipine-benazepril (LOTREL) 10-20 MG per capsule   Oral   Take 1 capsule by mouth daily.         . citalopram (CELEXA) 10 MG tablet   Oral   Take 10 mg by mouth daily.         Marland Kitchen levothyroxine (SYNTHROID, LEVOTHROID) 50 MCG tablet   Oral   Take 50 mcg by mouth daily before breakfast.         . pravastatin (PRAVACHOL) 20 MG tablet   Oral   Take 20 mg by mouth daily.           Allergies Prednisone  History reviewed. No pertinent family history.  Social History Social History  Substance Use Topics  . Smoking status: Never Smoker   . Smokeless tobacco: Never Used  . Alcohol Use: 0.6 oz/week    1 Glasses of wine per  week    Review of Systems Constitutional: No fever/chills Eyes: No visual changes. ENT: No sore throat. Cardiovascular: See history of present illness Respiratory: See history of present illness Gastrointestinal: No abdominal pain.  No nausea, no vomiting.  No diarrhea.  No constipation. Genitourinary: Negative for dysuria. Musculoskeletal: Negative for back pain. Skin: Negative for rash. Neurological: Negative for headaches, focal weakness or numbness.  10-point ROS otherwise negative.  ____________________________________________   PHYSICAL EXAM:  VITAL SIGNS: ED Triage Vitals  Enc Vitals Group     BP 02/07/16 1228 101/60 mmHg     Pulse Rate 02/07/16 1236 94     Resp 02/07/16 1236 20     Temp 02/07/16 1236 98.3 F (36.8 C)     Temp Source 02/07/16 1236 Oral     SpO2 02/07/16 1236 92 %     Weight 02/07/16 1228 200 lb (90.719 kg)     Height 02/07/16 1236 6\' 3"  (1.905 m)     Head Cir --      Peak Flow --      Pain Score 02/07/16 1228 6     Pain Loc --  Pain Edu? --      Excl. in [place]? --     Constitutional: Alert and oriented. Well appearing and in no acute distress. Eyes: Conjunctivae are normal. PERRL. EOMI. Head: Atraumatic. Nose: No congestion/rhinnorhea. Mouth/Throat: Mucous membranes are moist.  Oropharynx non-erythematous. Neck: No stridor.  { Cardiovascular: Normal rate, regular rhythm. Grossly normal heart sounds.  Good peripheral circulation. Respiratory: Normal respiratory effort.  No retractions. Lungs CTAB.Palpation of the right upper chest reproduces the pain. Gastrointestinal: Soft and nontender. No distention. No abdominal bruits. No CVA tenderness. Musculoskeletal: No lower extremity tenderness nor edema.  No joint effusions. Neurologic:  Normal speech and language. No gross focal neurologic deficits are appreciated. No gait instability. Skin:  Skin is warm, dry and intact. No rash noted. Psychiatric: Mood and affect are normal. Speech and  behavior are normal.  ____________________________________________   LABS (all labs ordered are listed, but only abnormal results are displayed)  Labs Reviewed  COMPREHENSIVE METABOLIC PANEL - Abnormal; Notable for the following:    Sodium 134 (*)    All other components within normal limits  CBC WITH DIFFERENTIAL/PLATELET - Abnormal; Notable for the following:    WBC 14.8 (*)    RBC 4.38 (*)    HCT 39.7 (*)    RDW 15.7 (*)    Neutro Abs 11.6 (*)    Monocytes Absolute 1.9 (*)    All other components within normal limits  FIBRIN DERIVATIVES D-DIMER ([facility] ONLY) - Abnormal; Notable for the following:    Fibrin derivatives D-dimer ([facility]) 545 (*)    All other components within normal limits  TROPONIN I  TROPONIN I   ____________________________________________  EKG  EKG here read and interpreted by me shows normal sinus rhythm at a rate of 93 normal axis there appears to be PR segment depression inferiorly and possibly in lead 2 is well this was present on the EKG done at the urgent care. There may also now be S PR segment  depression in V5 and 6 as well. ____________________________________________  RADIOLOGY   ____________________________________________   PROCEDURES    ____________________________________________   INITIAL IMPRESSION / ASSESSMENT AND PLAN / ED COURSE  Pertinent labs & imaging results that were available during my care of the patient were reviewed by me and considered in my medical decision making (see chart for details). Waiting for CT of the chest and the second troponin will sign the patient out to Dr. Dineen Kid  ____________________________________________   FINAL CLINICAL IMPRESSION(S) / ED DIAGNOSES  Final diagnoses:  Chest pain, unspecified chest pain type      Nena Polio, MD 02/07/16 [phone]

## 2016-02-07 NOTE — ED Notes (Signed)
Patient arrived via EMS from [facility]. Pt states last night he was having pain on the right side of his chest and neck.  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  No relief this morning.  Was seen at [facility].  Was given 324 of ASA, 1/2 inch nitro paste.  Pt also reports some dyspnea and nausea but denies vomiting.

## 2016-02-07 NOTE — ED Provider Notes (Signed)
CSN: [id number]     Arrival date & time 02/07/16  1046 History   First MD Initiated Contact with Patient 02/07/16 1051     Chief Complaint  Patient presents with  . Chest Pain  . Shortness of Breath   (Consider location/radiation/quality/duration/timing/severity/associated sxs/prior Treatment) HPI Comments: 75 yo male with a c/o mid chest pressure since last night, radiating to his neck and left arm, constant and 7-8/10 pressure. Patient has a h/o hypertension, hypercholesterolemia and family history of premature CAD (father died in his late 33s from [place]).  Patient is a 75 y.o. male presenting with chest pain and shortness of breath. The history is provided by the patient.  Chest Pain Associated symptoms: shortness of breath   Shortness of Breath Associated symptoms: chest pain     Past Medical History  Diagnosis Date  . Hypertension   . Hyperlipemia   . Thyroid disease    Past Surgical History  Procedure Laterality Date  . Cardiac surgery    . Tympanoplasty Right   . Vocal cords    . Shoulder surgery Right    History reviewed. No pertinent family history. Social History  Substance Use Topics  . Smoking status: Never Smoker   . Smokeless tobacco: Never Used  . Alcohol Use: 0.6 oz/week    1 Glasses of wine per week    Review of Systems  Respiratory: Positive for shortness of breath.   Cardiovascular: Positive for chest pain.    Allergies  Prednisone  Home Medications   Prior to Admission medications   Medication Sig Start Date End Date Taking? Authorizing Provider  allopurinol (ZYLOPRIM) 300 MG tablet Take 300 mg by mouth daily.    Historical Provider, MD  amLODipine-benazepril (LOTREL) 10-20 MG per capsule Take 1 capsule by mouth daily.    Historical Provider, MD  citalopram (CELEXA) 10 MG tablet Take 10 mg by mouth daily.    Historical Provider, MD  levothyroxine (SYNTHROID, LEVOTHROID) 50 MCG tablet Take 50 mcg by mouth daily before breakfast.    Historical  Provider, MD  pravastatin (PRAVACHOL) 20 MG tablet Take 20 mg by mouth daily.    Historical Provider, MD   Meds Ordered and Administered this Visit   Medications  aspirin chewable tablet 324 mg (324 mg Oral Given 02/07/16 1115)  nitroGLYCERIN (NITROGLYN) 2 % ointment 0.5 inch (0.5 inches Topical Given 02/07/16 1116)    BP 129/68 mmHg  Pulse 82  Temp(Src) 98.3 F (36.8 C) (Tympanic)  Resp 16  Ht 6\' 3"  (1.905 m)  Wt 200 lb (90.719 kg)  BMI 25.00 kg/m2  SpO2 97% No data found.   Physical Exam  Constitutional: He appears well-developed and well-nourished. No distress.  Neck: Neck supple. No JVD present. No tracheal deviation present. No thyromegaly present.  Cardiovascular: Normal rate, regular rhythm, normal heart sounds and intact distal pulses.   No murmur heard. Pulmonary/Chest: Effort normal. No stridor. No respiratory distress. He has no wheezes. He has no rales.  Musculoskeletal: He exhibits no edema.  Neurological: He is alert.  Skin: He is not diaphoretic.  Nursing note and vitals reviewed.   ED Course  Procedures (including critical care time)  Labs Review Labs Reviewed - No data to display  Imaging Review No results found.   Visual Acuity Review  Right Eye Distance:   Left Eye Distance:   Bilateral Distance:    Right Eye Near:   Left Eye Near:    Bilateral Near:       EKG:  sinus rhythm, ST elevation V2; no previous EKGs for comparison; reviewed by me  MDM   1. Chest pressure   (mid sternal, radiating to neck and left arm in patient with cardiac risk factors)   Patient given 4 baby ASA, 1/2 inch nitropaste, 2L O2 per nasal cannula; recommend transport to ED by EMS for further evaluation and management; report called to charge RN at [facility] ED     Norval Gable, MD 02/07/16 [phone]

## 2016-02-07 NOTE — ED Notes (Signed)
EMS called to transport patient to [facility] ED 

## 2016-09-04 ENCOUNTER — Encounter: Payer: Self-pay | Admitting: Emergency Medicine

## 2016-09-04 ENCOUNTER — Ambulatory Visit
Admission: EM | Admit: 2016-09-04 | Discharge: 2016-09-04 | Disposition: A | Discharge status: Home / Self Care | Payer: Medicare PPO | Attending: Family Medicine | Admitting: Family Medicine

## 2016-09-04 DIAGNOSIS — H7291 Unspecified perforation of tympanic membrane, right ear: Secondary | ICD-10-CM

## 2016-09-04 DIAGNOSIS — T700XXA Otitic barotrauma, initial encounter: Secondary | ICD-10-CM

## 2016-09-04 NOTE — ED Provider Notes (Signed)
CSN: [id number]     Arrival date & time 09/04/16  1433 History   First MD Initiated Contact with Patient 09/04/16 1635     Chief Complaint  Patient presents with  . Otalgia    right   (Consider location/radiation/quality/duration/timing/severity/associated sxs/prior Treatment) HPI  This is a 75 year old male who presents with pain and drainage in his right ear for 2 weeks. Sates that the drainage has been a grayish color. He has been using over-the-counter preparations including swimmer's ear medicine, and sweet oil. He has used a Q-tip to clean out the debris doesn't think he put the Q-tip in very far. 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 He's had no problems since then until recently. He's had no airplane trips recently has not been in the [place]. He said today he has difficulty hearing out of that ear. He performs a Valsalva maneuver he feels a whistle in his ear. No fever or chills. He's had no bloody drainage from his ear.         Past Medical History:  Diagnosis Date  . Hyperlipemia   . Hypertension   . Thyroid disease    Past Surgical History:  Procedure Laterality Date  . CARDIAC SURGERY    . SHOULDER SURGERY Right   . TYMPANOPLASTY Right   . vocal cords     History reviewed. No pertinent family history. Social History  Substance Use Topics  . Smoking status: Never Smoker  . Smokeless tobacco: Never Used  . Alcohol use 0.6 oz/week    1 Glasses of wine per week    Review of Systems  Constitutional: Positive for activity change. Negative for chills, fatigue and fever.  HENT: Positive for ear discharge and ear pain.   All other systems reviewed and are negative.   Allergies  Prednisone  Home Medications   Prior to Admission medications   Medication Sig Start Date End Date Taking? Authorizing Provider  allopurinol (ZYLOPRIM) 300 MG tablet Take 300 mg by mouth daily.    Historical  Provider, MD  amLODipine-benazepril (LOTREL) 10-20 MG per capsule Take 1 capsule by mouth daily.    Historical Provider, MD  citalopram (CELEXA) 10 MG tablet Take 10 mg by mouth daily.    Historical Provider, MD  levothyroxine (SYNTHROID, LEVOTHROID) 50 MCG tablet Take 50 mcg by mouth daily before breakfast.    Historical Provider, MD  pravastatin (PRAVACHOL) 20 MG tablet Take 20 mg by mouth daily.    Historical Provider, MD   Meds Ordered and Administered this Visit  Medications - No data to display  BP 128/79 (BP Location: Left Arm)   Pulse 78   Temp 97.5 F (36.4 C) (Tympanic)   Resp 16   Ht 6\' 3"  (1.905 m)   Wt 202 lb (91.6 kg)   SpO2 97%   BMI 25.25 kg/m  No data found.   Physical Exam  Constitutional: He is oriented to person, place, and time. He appears well-developed and well-nourished. No distress.  HENT:  Head: Normocephalic and atraumatic.  Left Ear: External ear normal.  Nose: Nose normal.  Mouth/Throat: Oropharynx is clear and moist. No oropharyngeal exudate.  Examination of the left ear is normal. Examination of the right ear shows a perforation in the eardrum with several air-fluid bubbles seen. No active drainage is seen from the canal.  Eyes: EOM are normal. Pupils are equal, round, and reactive to light.  Neck: Normal  range of motion. Neck supple.  Musculoskeletal: Normal range of motion.  Neurological: He is alert and oriented to person, place, and time.  Skin: Skin is warm and dry. He is not diaphoretic.  Psychiatric: He has a normal mood and affect. His behavior is normal. Judgment and thought content normal.  Nursing note and vitals reviewed.   Urgent Care Course   Clinical Course     Procedures (including critical care time)  Labs Review Labs Reviewed - No data to display  Imaging Review No results found.   Visual Acuity Review  Right Eye Distance:   Left Eye Distance:   Bilateral Distance:    Right Eye Near:   Left Eye Near:     Bilateral Near:         MDM   1. Perforated eardrum, right   2. Barotrauma, otic, initial encounter    I told patient that he should be seen by a specialist and have given him the name of an ENT to see next week. Previous surgery to this ear. This time I have nothing to offer him other than referral and for him to use a cotton ball in the ear in order to collect any drainage. I advised against any probing of the ear with Q-tip her finger and do not recommend any further instillation of over-the-counter medications into the ear. He will contact the ENT first of next week    Lorin Picket, PA-C 09/04/16 1719

## 2016-09-04 NOTE — ED Triage Notes (Signed)
Patient c/o pain and drainage in his right ear for 2 weeks.

## 2018-03-22 ENCOUNTER — Encounter: Payer: Self-pay | Admitting: Emergency Medicine

## 2018-03-22 ENCOUNTER — Ambulatory Visit
Admission: EM | Admit: 2018-03-22 | Discharge: 2018-03-22 | Disposition: A | Discharge status: Home / Self Care | Payer: Medicare PPO

## 2018-03-22 ENCOUNTER — Other Ambulatory Visit: Payer: Self-pay

## 2018-03-22 DIAGNOSIS — M5416 Radiculopathy, lumbar region: Secondary | ICD-10-CM

## 2018-03-22 MED ORDER — TIZANIDINE HCL 2 MG PO CAPS: 4.0000 mg | ORAL_CAPSULE | Freq: Three times a day (TID) | ORAL | 0 refills | Status: DC

## 2018-03-22 MED ORDER — MELOXICAM 7.5 MG PO TABS: 7.5000 mg | ORAL_TABLET | Freq: Every day | ORAL | 0 refills | Status: DC

## 2018-03-22 MED ORDER — KETOROLAC TROMETHAMINE 60 MG/2ML IM SOLN
30.0000 mg | Freq: Once | INTRAMUSCULAR | Status: AC
Start: 2018-03-22 — End: 2018-03-22
  Administered 2018-03-22: 30 mg via INTRAMUSCULAR

## 2018-03-22 NOTE — ED Provider Notes (Signed)
[facility]    CSN: [id number] Arrival date & time: 03/22/18  1321     History   Chief Complaint Chief Complaint  Patient presents with  . Hip Pain    HPI Arthur Romero is a 77 y.o. male.   HPI  77 year old male presents with left hip and left sided low back pain started on Saturday 3 days prior to this visit.  He has tried heat and topical pain lotion but has not had any relief.  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  He states that about a week ago he was bent over sitting while painting the porch of his house.  He also on Wednesday of last week took an 9-hour car trip very few breaks during the trip.  States that the pain will radiate to the level of his knee.  It does cause him to limp.       Past Medical History:  Diagnosis Date  . Hyperlipemia   . Hypertension   . Thyroid disease     There are no active problems to display for this patient.   Past Surgical History:  Procedure Laterality Date  . CARDIAC SURGERY    . SHOULDER SURGERY Right   . TYMPANOPLASTY Right   . vocal cords         Home Medications    Prior to Admission medications   Medication Sig Start Date End Date Taking? Authorizing Provider  allopurinol (ZYLOPRIM) 300 MG tablet Take 300 mg by mouth daily.   Yes [provider]  amLODipine-benazepril (LOTREL) 10-20 MG per capsule Take 1 capsule by mouth daily.   Yes [provider]  citalopram (CELEXA) 10 MG tablet Take 10 mg by mouth daily.   Yes [provider]  gabapentin (NEURONTIN) 400 MG capsule Take 400 mg by mouth 3 (three) times daily.   Yes [provider]  levothyroxine (SYNTHROID, LEVOTHROID) 50 MCG tablet Take 50 mcg by mouth daily before breakfast.   Yes [provider]  pravastatin (PRAVACHOL) 20 MG tablet Take 20 mg by mouth daily.   Yes [provider]  meloxicam (MOBIC) 7.5 MG tablet Take 1 tablet (7.5 mg total) by mouth daily. 03/22/18   Lorin Picket, PA-C  tizanidine (ZANAFLEX) 2 MG capsule Take 2 capsules (4 mg total) by mouth 3 (three) times daily. 03/22/18   Lorin Picket, PA-C    Family History Family History  Problem Relation Age of Onset  . Hypertension Mother   . Osteoarthritis Mother   . Heart attack Father   . Tuberculosis Father     Social History Social History   Tobacco Use  . Smoking status: Never Smoker  . Smokeless tobacco: Never Used  Substance Use Topics  . Alcohol use: Yes    Alcohol/week: 0.6 oz    Types: 1 Glasses of wine per week  . Drug use: Not on file     Allergies   Prednisone   Review of Systems Review of Systems  Constitutional: Positive for activity change. Negative for appetite change, chills, fatigue and fever.  Musculoskeletal: Positive for back pain, gait problem and myalgias.  All other systems reviewed and are negative.    Physical Exam Triage Vital Signs ED Triage Vitals  Enc Vitals Group     BP 03/22/18 1401 125/77     Pulse Rate 03/22/18 1401 63     Resp 03/22/18 1401 18     Temp 03/22/18 1401 97.9  F (36.6 C)     Temp Source 03/22/18 1401 Oral     SpO2 03/22/18 1401 95 %     Weight 03/22/18 1357 198 lb (89.8 kg)     Height 03/22/18 1357 6\' 3"  (1.905 m)     Head Circumference --      Peak Flow --      Pain Score 03/22/18 1357 8     Pain Loc --      Pain Edu? --      Excl. in [place]? --    No data found.  Updated Vital Signs BP 125/77 (BP Location: Left Arm)   Pulse 63   Temp 97.9 F (36.6 C) (Oral)   Resp 18   Ht 6\' 3"  (1.905 m)   Wt 198 lb (89.8 kg)   SpO2 95%   BMI 24.75 kg/m   Visual Acuity Right Eye Distance:   Left Eye Distance:   Bilateral Distance:    Right Eye Near:   Left Eye Near:    Bilateral Near:     Physical Exam  Constitutional: He is oriented to person, place, and time. He appears well-developed and well-nourished. No distress.  HENT:  Head: Normocephalic.  Eyes: Pupils are equal, round, and reactive to light.    Neck: Normal range of motion.  Musculoskeletal: He exhibits tenderness.  Examination of the lumbar spine shows a level pelvis in stance.  Patient does have flattening of the lordotic curve.  He is able to forward flex to approximately 25 to 30 degrees.  He has difficulty assuming an upright posture.  Toe and heel walk normally.  DTRs are 1+/4 but symmetrical bilaterally. E HL peroneal and anterior tibialis muscles are strong to clinical testing.  Sensation is intact throughout.  Maximum tenderness is in the right paraspinous muscles at approximately L3-L4 levels.  This reproduces symptoms.  Examination of the hips shows no pain and good range of motion to internal and external rotation in the seated position.  Neurological: He is alert and oriented to person, place, and time.  Skin: Skin is warm and dry. He is not diaphoretic.  Psychiatric: He has a normal mood and affect. His behavior is normal. Judgment and thought content normal.  Nursing note and vitals reviewed.    UC Treatments / Results  Labs (all labs ordered are listed, but only abnormal results are displayed) Labs Reviewed - No data to display  EKG None  Radiology No results found.  Procedures Procedures (including critical care time)  Medications Ordered in UC Medications  ketorolac (TORADOL) injection 30 mg (30 mg Intramuscular Given 03/22/18 1447)    Initial Impression / Assessment and Plan / UC Course  I have reviewed the triage vital signs and the nursing notes.  Pertinent labs & imaging results that were available during my care of the patient were reviewed by me and considered in my medical decision making (see chart for details).     Plan: 1. Test/x-ray results and diagnosis reviewed with patient 2. rx as per orders; risks, benefits, potential side effects reviewed with patient 3. Recommend supportive treatment with symptom avoidance and rest.  Use ice alternating with heat for comfort.  Precautions were  given regarding the use of muscle relaxers.  Recommend that he follow-up with his primary care physician next week.  If he worsens he should go to the emergency room or return to our clinic. 4. F/u prn if symptoms worsen or don't improve  Final Clinical Impressions(s) / UC Diagnoses  Final diagnoses:  Lumbar radiculitis     Discharge Instructions     Avoid symptoms and rest as much as possible. Apply ice 20 minutes out of every 2 hours 4-5 times daily for comfort. Use  Caution while taking muscle relaxers.  Do not perform activities requiring concentration or judgment and do not drive.     ED Prescriptions    Medication Sig Dispense Auth. Provider   meloxicam (MOBIC) 7.5 MG tablet Take 1 tablet (7.5 mg total) by mouth daily. 7 tablet Crecencio Mc P, PA-C   tizanidine (ZANAFLEX) 2 MG capsule Take 2 capsules (4 mg total) by mouth 3 (three) times daily. 7 capsule Lorin Picket, PA-C     Controlled Substance Prescriptions [place] Controlled Substance Registry consulted? Not Applicable   Lorin Picket, PA-C 03/22/18 1458

## 2018-03-22 NOTE — ED Triage Notes (Signed)
Patient states he noticed left hip pain on Saturday. Has tried heat and pain lotion with no relief

## 2018-03-22 NOTE — Discharge Instructions (Addendum)
Avoid symptoms and rest as much as possible. Apply ice 20 minutes out of every 2 hours 4-5 times daily for comfort. Use  Caution while taking muscle relaxers.  Do not perform activities requiring concentration or judgment and do not drive.

## 2018-07-03 ENCOUNTER — Ambulatory Visit
Admission: EM | Admit: 2018-07-03 | Discharge: 2018-07-03 | Disposition: A | Discharge status: Home / Self Care | Payer: Medicare PPO | Attending: Emergency Medicine | Admitting: Emergency Medicine

## 2018-07-03 ENCOUNTER — Encounter: Payer: Self-pay | Admitting: Gynecology

## 2018-07-03 ENCOUNTER — Ambulatory Visit (INDEPENDENT_AMBULATORY_CARE_PROVIDER_SITE_OTHER): Payer: Medicare PPO

## 2018-07-03 DIAGNOSIS — E785 Hyperlipidemia, unspecified: Secondary | ICD-10-CM | POA: Insufficient documentation

## 2018-07-03 DIAGNOSIS — R0789 Other chest pain: Secondary | ICD-10-CM | POA: Diagnosis not present

## 2018-07-03 DIAGNOSIS — R42 Dizziness and giddiness: Secondary | ICD-10-CM

## 2018-07-03 DIAGNOSIS — E079 Disorder of thyroid, unspecified: Secondary | ICD-10-CM | POA: Diagnosis not present

## 2018-07-03 DIAGNOSIS — Z79899 Other long term (current) drug therapy: Secondary | ICD-10-CM | POA: Insufficient documentation

## 2018-07-03 DIAGNOSIS — R531 Weakness: Secondary | ICD-10-CM | POA: Diagnosis present

## 2018-07-03 DIAGNOSIS — R300 Dysuria: Secondary | ICD-10-CM

## 2018-07-03 DIAGNOSIS — Z888 Allergy status to other drugs, medicaments and biological substances status: Secondary | ICD-10-CM | POA: Diagnosis not present

## 2018-07-03 DIAGNOSIS — E876 Hypokalemia: Secondary | ICD-10-CM | POA: Diagnosis not present

## 2018-07-03 DIAGNOSIS — N390 Urinary tract infection, site not specified: Secondary | ICD-10-CM | POA: Insufficient documentation

## 2018-07-03 DIAGNOSIS — I10 Essential (primary) hypertension: Secondary | ICD-10-CM | POA: Insufficient documentation

## 2018-07-03 DIAGNOSIS — R51 Headache: Secondary | ICD-10-CM | POA: Diagnosis not present

## 2018-07-03 LAB — URINALYSIS, COMPLETE (UACMP) WITH MICROSCOPIC
Bacteria, UA: NONE SEEN
Bilirubin Urine: NEGATIVE
Glucose, UA: NEGATIVE mg/dL
Hgb urine dipstick: NEGATIVE
Ketones, ur: NEGATIVE mg/dL
Nitrite: NEGATIVE
Protein, ur: NEGATIVE mg/dL
Specific Gravity, Urine: 1.015 (ref 1.005–1.030)
Squamous Epithelial / LPF: NONE SEEN (ref 0–5)
pH: 7 (ref 5.0–8.0)

## 2018-07-03 LAB — COMPREHENSIVE METABOLIC PANEL
ALT: 22 U/L (ref 0–44)
AST: 25 U/L (ref 15–41)
Albumin: 4.5 g/dL (ref 3.5–5.0)
Alkaline Phosphatase: 66 U/L (ref 38–126)
Anion gap: 12 (ref 5–15)
BUN: 14 mg/dL (ref 8–23)
CO2: 28 mmol/L (ref 22–32)
Calcium: 9.4 mg/dL (ref 8.9–10.3)
Chloride: 98 mmol/L (ref 98–111)
Creatinine, Ser: 1.03 mg/dL (ref 0.61–1.24)
GFR calc Af Amer: >60 mL/min (ref >60)
GFR calc non Af Amer: >60 mL/min (ref >60)
Glucose, Bld: 114 mg/dL — ABNORMAL HIGH (ref 70–99)
Potassium: 3.2 mmol/L — ABNORMAL LOW (ref 3.5–5.1)
Sodium: 138 mmol/L (ref 135–145)
Total Bilirubin: 0.6 mg/dL (ref 0.3–1.2)
Total Protein: 7.9 g/dL (ref 6.5–8.1)

## 2018-07-03 LAB — CBC WITH DIFFERENTIAL/PLATELET
Basophils Absolute: 0.1 10*3/uL (ref 0–0.1)
Basophils Relative: 1 %
Eosinophils Absolute: 0.1 10*3/uL (ref 0–0.7)
Eosinophils Relative: 1 %
HCT: 46.1 % (ref 40.0–52.0)
Hemoglobin: 15.3 g/dL (ref 13.0–18.0)
Lymphocytes Relative: 17 %
Lymphs Abs: 1.8 10*3/uL (ref 1.0–3.6)
MCH: 31.2 pg (ref 26.0–34.0)
MCHC: 33.2 g/dL (ref 32.0–36.0)
MCV: 94.1 fL (ref 80.0–100.0)
Monocytes Absolute: 0.8 10*3/uL (ref 0.2–1.0)
Monocytes Relative: 8 %
Neutro Abs: 8.1 10*3/uL — ABNORMAL HIGH (ref 1.4–6.5)
Neutrophils Relative %: 73 %
Platelets: 266 10*3/uL (ref 150–440)
RBC: 4.9 MIL/uL (ref 4.40–5.90)
RDW: 15.3 % — ABNORMAL HIGH (ref 11.5–14.5)
WBC: 10.9 10*3/uL — ABNORMAL HIGH (ref 3.8–10.6)

## 2018-07-03 IMAGING — CT CT HEAD W/O CM
1 series · 16 of 29 positions shown, 20 images · non-contrast
Comparison: None.

CLINICAL DATA: 76-year-old male with a history of dizziness

EXAM:
CT HEAD WITHOUT CONTRAST
TECHNIQUE: Contiguous axial images were obtained from the base of the skull
through the vertex without intravenous contrast.

[Series 2: head wo · axial · 0.46mm/px · z∈[+213,+343]mm · 16 of 29 slices shown, 20 images]
[im 2/29  brain]
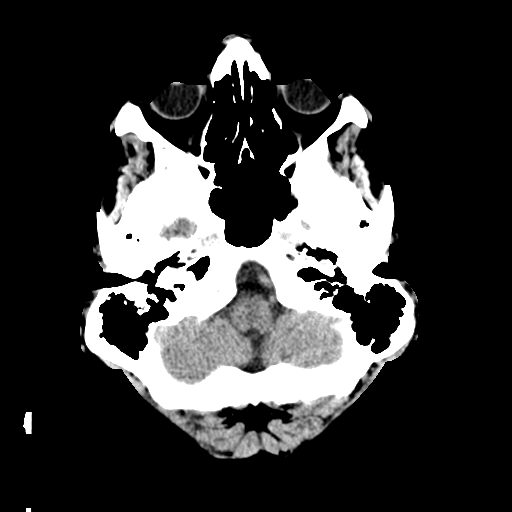
[im 2/29  bone]
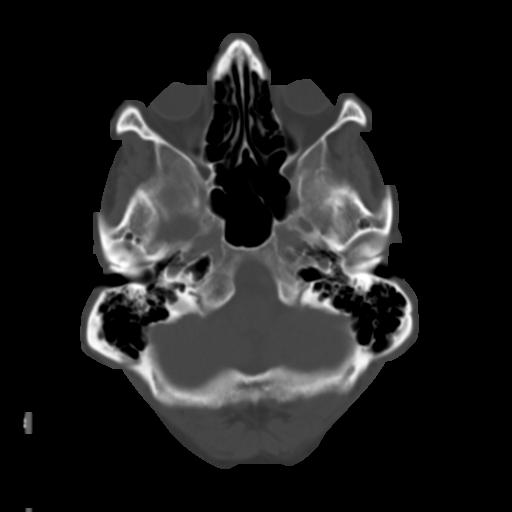
[im 4/29  brain]
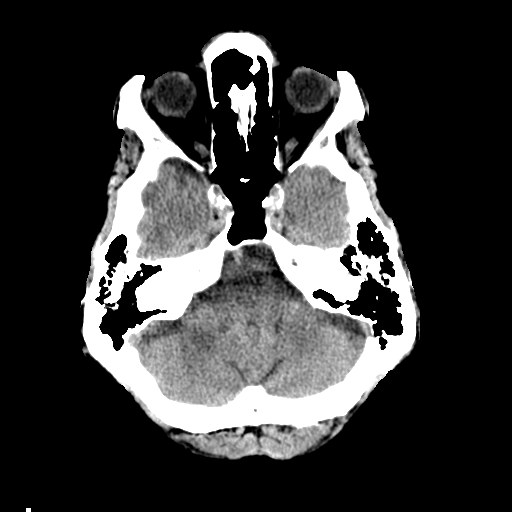
[im 6/29  brain]
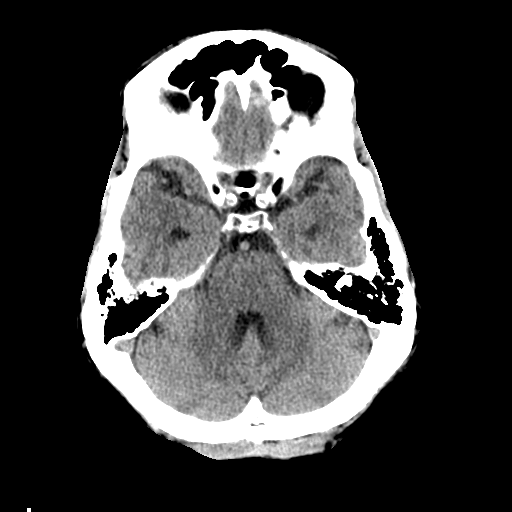
[im 7/29  brain]
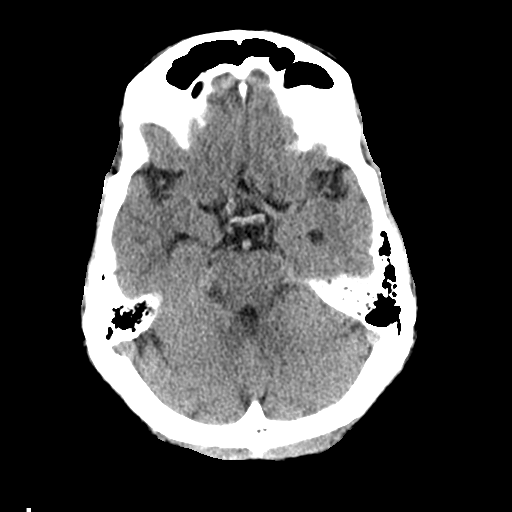
[im 9/29  brain]
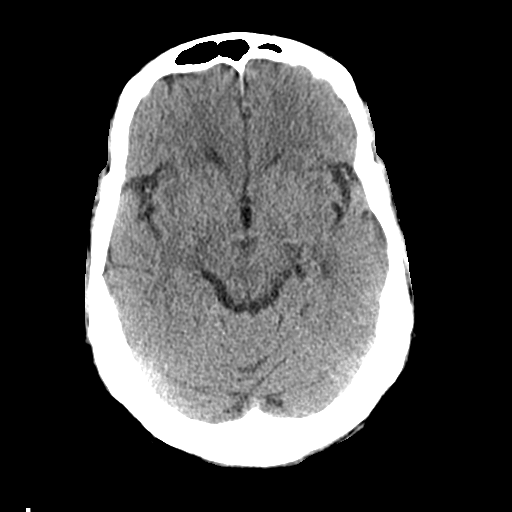
[im 9/29  bone]
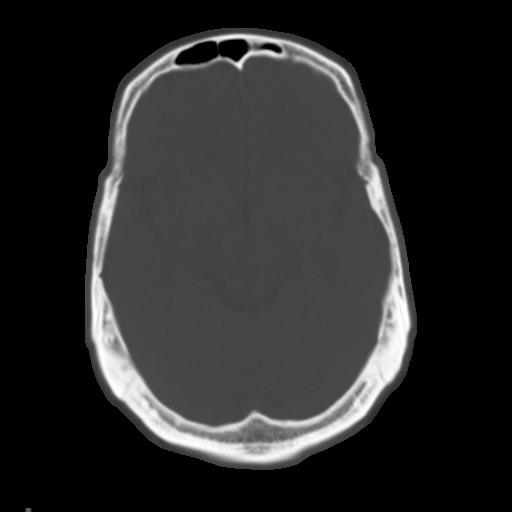
[im 11/29  brain]
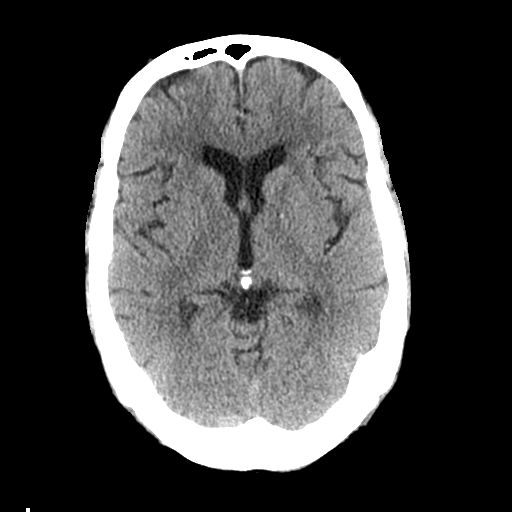
[im 12/29  brain]
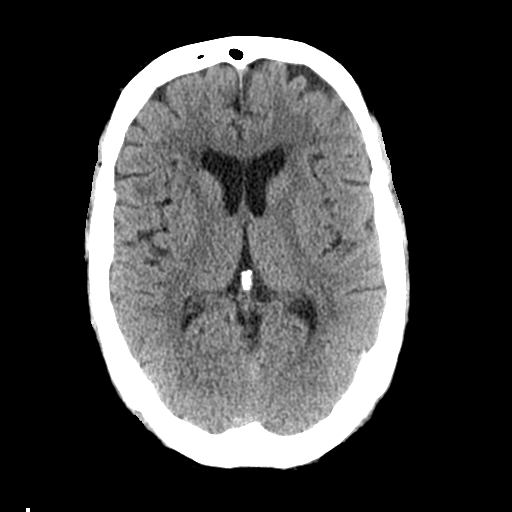
[im 14/29  brain]
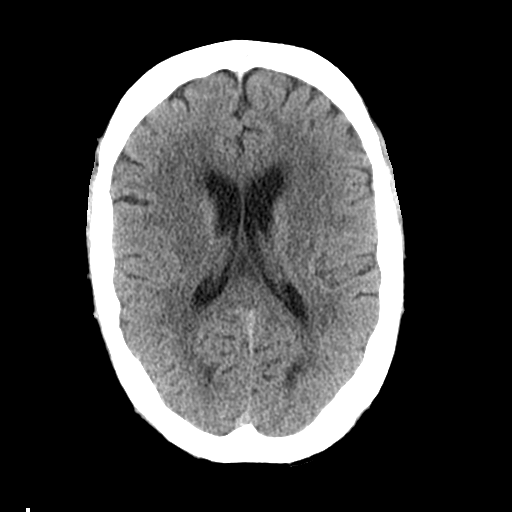
[im 16/29  brain]
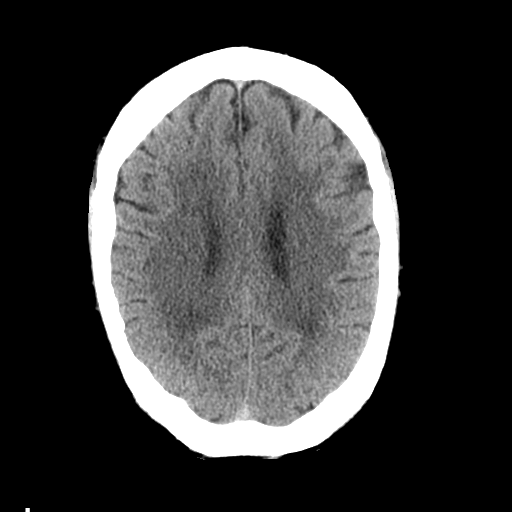
[im 16/29  bone]
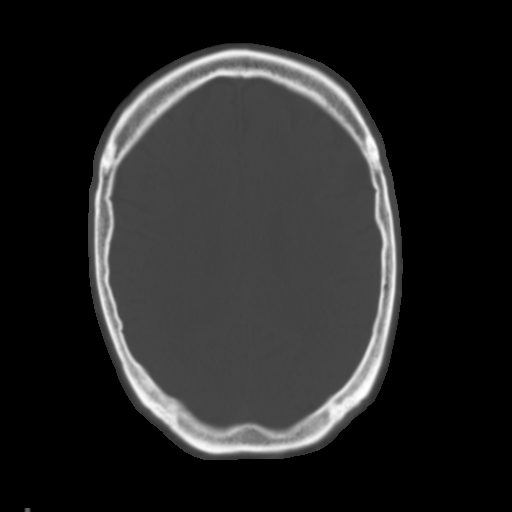
[im 18/29  brain]
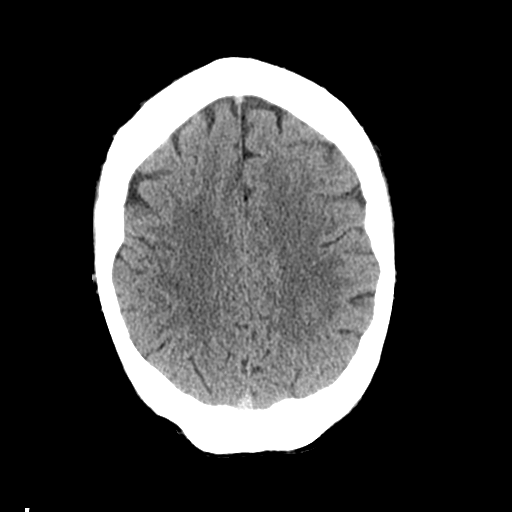
[im 19/29  brain]
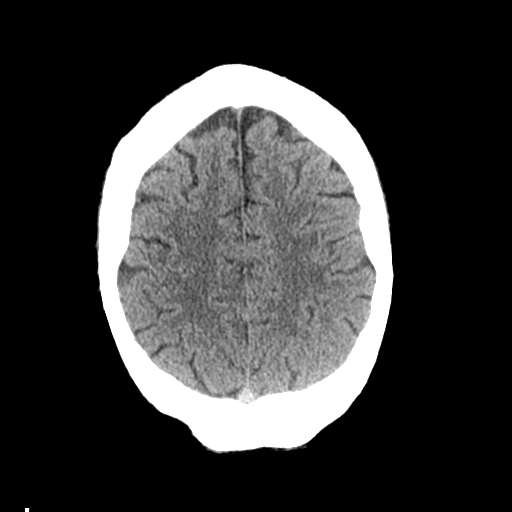
[im 21/29  brain]
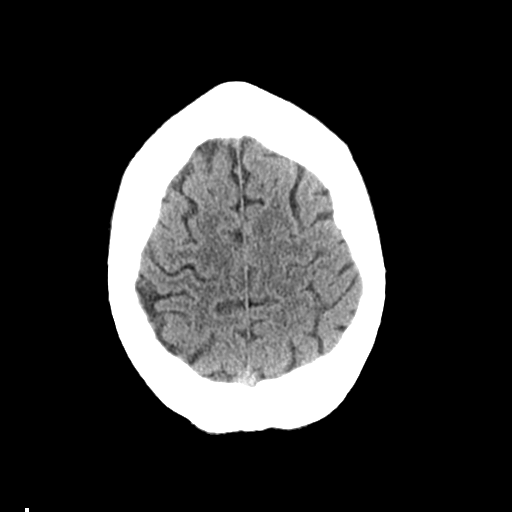
[im 23/29  brain]
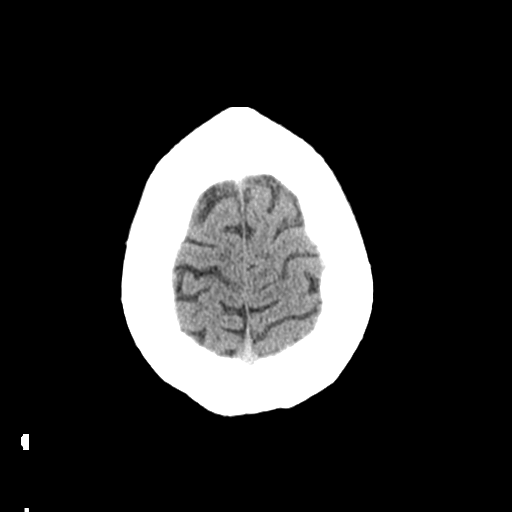
[im 23/29  bone]
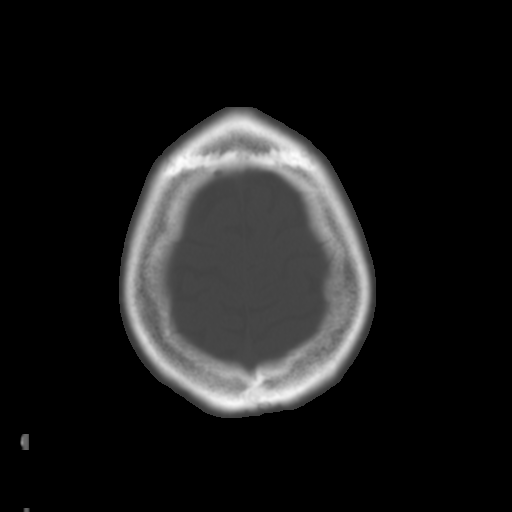
[im 24/29  brain]
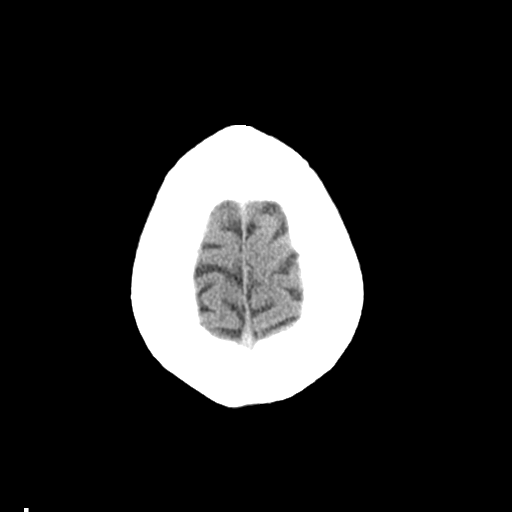
[im 26/29  brain]
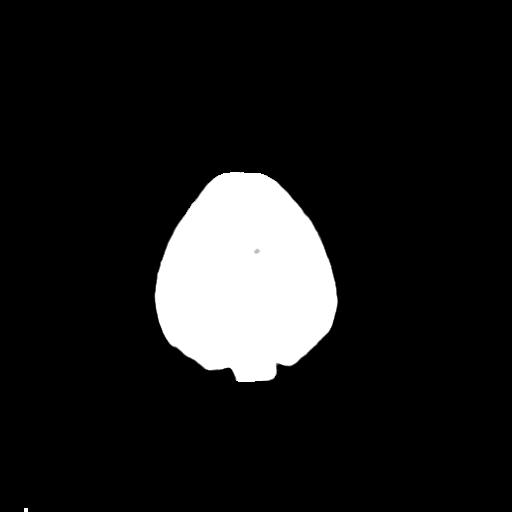
[im 28/29  brain]
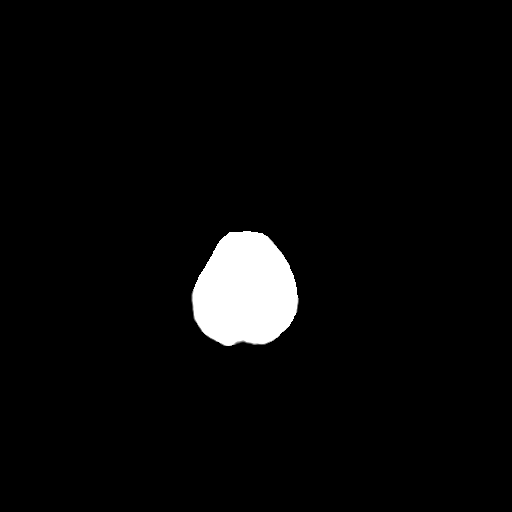

[16 of 29 positions shown; findings below may reference images not displayed]

FINDINGS: Brain: No acute intracranial hemorrhage. No midline shift or mass
effect. Gray-white differentiation maintained. Unremarkable
appearance of the ventricular system. Minimal patchy opacity in the
bilateral periventricular white matter.

Vascular: Calcifications of the anterior circulation.

Skull: No acute fracture.  No aggressive bone lesion identified.

Sinuses/Orbits: Unremarkable appearance of the orbits. Mastoid air
cells clear. No middle ear effusion. No significant sinus disease.

Other: None
IMPRESSION: Negative head CT for acute intracranial abnormality.

Evidence of chronic microvascular ischemic disease and associated
intracranial atherosclerosis.

## 2018-07-03 MED ORDER — POTASSIUM CHLORIDE CRYS ER 20 MEQ PO TBCR
30.0000 meq | EXTENDED_RELEASE_TABLET | Freq: Once | ORAL | Status: AC
  Administered 2018-07-03: 30 meq via ORAL

## 2018-07-03 MED ORDER — CEPHALEXIN 500 MG PO CAPS: 500.0000 mg | ORAL_CAPSULE | Freq: Two times a day (BID) | ORAL | 0 refills | Status: DC

## 2018-07-03 NOTE — Discharge Instructions (Signed)
Follow up with your primary care physician this week.  If you worsen go immediately to the emergency room.

## 2018-07-03 NOTE — ED Provider Notes (Signed)
[facility]    CSN: [id number] Arrival date & time: 07/03/18  1156     History   Chief Complaint Chief Complaint  Patient presents with  . Dizziness  . Extremity Weakness    HPI Arthur Romero is a 77 y.o. male.   HPI  77 year old male presents with vague complaints of dizziness that he has had for 2 weeks.  Is a rather poor historian.  He explains his symptoms as "weird".  In church he states that he he was singing in choir when all of a sudden he experienced this sensation was described as almost a near syncopal episode where he had leg weakness he also has had some anterior chest discomfort.  He has been having these episodes every day for the last 2 weeks.  They usually last between 5 and 6hours.  If he lies down and rests he awakens with improvement of his symptoms.  No focal neurological symptoms.  Except he has complained of blurry vision when he has these episodes.  He does state that he has had a mild headache over the right ear.  It still persists from this morning.  Has not lost any muscle strength.  He said no numbness or tingling.  He has had no nausea or vomiting.  Has no specific complaints at the room or he is spinning.  He also mentions slight burning when he urinates at times.  Had no change in his stream.  He denies any frequency urgency.          Past Medical History:  Diagnosis Date  . Hyperlipemia   . Hypertension   . Thyroid disease     There are no active problems to display for this patient.   Past Surgical History:  Procedure Laterality Date  . CARDIAC SURGERY    . SHOULDER SURGERY Right   . TYMPANOPLASTY Right   . vocal cords         Home Medications    Prior to Admission medications   Medication Sig Start Date End Date Taking? Authorizing Provider  allopurinol (ZYLOPRIM) 300 MG tablet Take 300 mg by mouth daily.   Yes [provider]  amLODipine-benazepril (LOTREL) 10-20 MG per capsule Take 1 capsule by mouth  daily.   Yes [provider]  citalopram (CELEXA) 10 MG tablet Take 10 mg by mouth daily.   Yes [provider]  gabapentin (NEURONTIN) 400 MG capsule Take 400 mg by mouth 3 (three) times daily.   Yes [provider]  levothyroxine (SYNTHROID, LEVOTHROID) 50 MCG tablet Take 50 mcg by mouth daily before breakfast.   Yes [provider]  meloxicam (MOBIC) 7.5 MG tablet Take 1 tablet (7.5 mg total) by mouth daily. 03/22/18  Yes Lorin Picket, PA-C  pravastatin (PRAVACHOL) 20 MG tablet Take 20 mg by mouth daily.   Yes [provider]  tizanidine (ZANAFLEX) 2 MG capsule Take 2 capsules (4 mg total) by mouth 3 (three) times daily. 03/22/18  Yes Lorin Picket, PA-C  cephALEXin (KEFLEX) 500 MG capsule Take 1 capsule (500 mg total) by mouth 2 (two) times daily. 07/03/18   Lorin Picket, PA-C    Family History Family History  Problem Relation Age of Onset  . Hypertension Mother   . Osteoarthritis Mother   . Heart attack Father   . Tuberculosis Father     Social History Social History   Tobacco Use  . Smoking status: Never Smoker  . Smokeless tobacco: Never  Used  Substance Use Topics  . Alcohol use: Yes    Alcohol/week: 1.0 standard drinks    Types: 1 Glasses of wine per week  . Drug use: Never     Allergies   Prednisone   Review of Systems Review of Systems  Constitutional: Positive for activity change. Negative for appetite change, chills, fatigue and fever.  HENT: Positive for ear pain.   Cardiovascular: Positive for chest pain.  Neurological: Positive for dizziness and headaches.  All other systems reviewed and are negative.    Physical Exam Triage Vital Signs ED Triage Vitals  Enc Vitals Group     BP 07/03/18 1222 (!) 134/93     Pulse Rate 07/03/18 1222 79     Resp --      Temp 07/03/18 1222 98.7 F (37.1 C)     Temp Source 07/03/18 1222 Oral     SpO2 07/03/18 1222 94 %     Weight 07/03/18 1224 200 lb (90.7 kg)       Height 07/03/18 1224 6\' 3"  (1.905 m)     Head Circumference --      Peak Flow --      Pain Score --      Pain Loc --      Pain Edu? --      Excl. in [place]? --    Orthostatic VS for the past 24 hrs:  BP- Lying Pulse- Lying BP- Sitting Pulse- Sitting BP- Standing at 0 minutes Pulse- Standing at 0 minutes  07/03/18 1242 (!) 145/92 74 (!) 151/93 75 (!) 150/103 85    Updated Vital Signs BP (!) 134/93 (BP Location: Left Arm)   Pulse 79   Temp 98.7 F (37.1 C) (Oral)   Ht 6\' 3"  (1.905 m)   Wt 200 lb (90.7 kg)   SpO2 94%   BMI 25.00 kg/m   Visual Acuity Right Eye Distance:   Left Eye Distance:   Bilateral Distance:    Right Eye Near:   Left Eye Near:    Bilateral Near:     Physical Exam  Constitutional: He is oriented to person, place, and time. He appears well-developed and well-nourished. No distress.  HENT:  Head: Normocephalic and atraumatic.  Right Ear: External ear normal.  Left Ear: External ear normal.  Nose: Nose normal.  Mouth/Throat: Oropharynx is clear and moist. No oropharyngeal exudate.  Eyes: Pupils are equal, round, and reactive to light. EOM are normal. Right eye exhibits no discharge. Left eye exhibits no discharge.  Neck: Normal range of motion.  Cardiovascular: Normal rate, regular rhythm and normal heart sounds. Exam reveals no gallop and no friction rub.  No murmur heard. Pulmonary/Chest: Effort normal and breath sounds normal.  Musculoskeletal: Normal range of motion.  Lymphadenopathy:    He has no cervical adenopathy.  Neurological: He is alert and oriented to person, place, and time. He displays normal reflexes. No cranial nerve deficit or sensory deficit. He exhibits normal muscle tone. Coordination normal.  Skin: Skin is warm and dry. He is not diaphoretic.  Psychiatric: He has a normal mood and affect. His behavior is normal. Judgment and thought content normal.  Nursing note and vitals reviewed.    UC Treatments / Results  Labs (all  labs ordered are listed, but only abnormal results are displayed) Labs Reviewed  CBC WITH DIFFERENTIAL/PLATELET - Abnormal; Notable for the following components:      Result Value   WBC 10.9 (*)    RDW 15.3 (*)  Neutro Abs 8.1 (*)    All other components within normal limits  COMPREHENSIVE METABOLIC PANEL - Abnormal; Notable for the following components:   Potassium 3.2 (*)    Glucose, Bld 114 (*)    All other components within normal limits  URINALYSIS, COMPLETE (UACMP) WITH MICROSCOPIC - Abnormal; Notable for the following components:   Leukocytes, UA SMALL (*)    All other components within normal limits  URINE CULTURE    EKG EKG: , normal sinus rhythm, unchanged from previous tracings.1st  Degree AV block.  No acute changes seen.  Rate 71  Radiology Ct Head Wo Contrast  Result Date: 07/03/2018 CLINICAL DATA:  77 year old male with a history of dizziness EXAM: CT HEAD WITHOUT CONTRAST TECHNIQUE: Contiguous axial images were obtained from the base of the skull through the vertex without intravenous contrast. COMPARISON:  None. FINDINGS: Brain: No acute intracranial hemorrhage. No midline shift or mass effect. Gray-white differentiation maintained. Unremarkable appearance of the ventricular system. Minimal patchy opacity in the bilateral periventricular white matter. Vascular: Calcifications of the anterior circulation. Skull: No acute fracture.  No aggressive bone lesion identified. Sinuses/Orbits: Unremarkable appearance of the orbits. Mastoid air cells clear. No middle ear effusion. No significant sinus disease. Other: None IMPRESSION: Negative head CT for acute intracranial abnormality. 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 Electronically Signed   By: Corrie Mckusick D.O.   On: 07/03/2018 13:46    Procedures Procedures (including critical care time)  Medications Ordered in UC Medications  potassium chloride (K-DUR,KLOR-CON)  CR tablet 30 mEq (30 mEq Oral Given 07/03/18 1403)    Initial Impression / Assessment and Plan / UC Course  I have reviewed the triage vital signs and the nursing notes.  Pertinent labs & imaging results that were available during my care of the patient were reviewed by me and considered in my medical decision making (see chart for details).     Given 30 mEq of potassium chloride here and have given him directions and information on potassium rich foods.  Because of the possible UTI we will start him on Keflex 500 mg twice daily for 5 days.  Culture his urine.  I recommended that he follow-up with his primary care physician this week.  He will make the appointment tomorrow.  If he worsens or has any different symptoms he will go to the emergency room immediately. Final Clinical Impressions(s) / UC Diagnoses   Final diagnoses:  Hypokalemia  Lightheadedness  Lower urinary tract infectious disease     Discharge Instructions     Follow up with your primary care physician this week.  If you worsen go immediately to the emergency room.    ED Prescriptions    Medication Sig Dispense Auth. Provider   cephALEXin (KEFLEX) 500 MG capsule Take 1 capsule (500 mg total) by mouth 2 (two) times daily. 10 capsule Lorin Picket, PA-C     Controlled Substance Prescriptions [place] Controlled Substance Registry consulted? Not Applicable   Lorin Picket, PA-C 07/03/18 1717

## 2018-07-03 NOTE — ED Triage Notes (Signed)
Patient c/o dizziness x 2 weeks. Per patient with leg weakness/ chest discomfort.

## 2018-07-04 NOTE — ED Notes (Signed)
Authorization for CT of Head (CPT [id number]) obtained from [facility]. Authorization #[id number] valid from 07/03/2018- 08/02/2018.

## 2018-07-05 LAB — URINE CULTURE

## 2020-02-14 ENCOUNTER — Other Ambulatory Visit (HOSPITAL_COMMUNITY): Payer: Self-pay | Admitting: Physician Assistant

## 2020-02-14 ENCOUNTER — Other Ambulatory Visit: Payer: Self-pay | Admitting: Physician Assistant

## 2020-02-14 DIAGNOSIS — H918X1 Other specified hearing loss, right ear: Secondary | ICD-10-CM

## 2020-02-14 DIAGNOSIS — IMO0001 Reserved for inherently not codable concepts without codable children: Secondary | ICD-10-CM

## 2020-02-16 ENCOUNTER — Ambulatory Visit
Admission: RE | Admit: 2020-02-16 | Discharge: 2020-02-16 | Disposition: A | Discharge status: Home / Self Care | Payer: Medicare PPO | Source: Ambulatory Visit | Attending: Physician Assistant | Admitting: Physician Assistant

## 2020-02-16 ENCOUNTER — Other Ambulatory Visit: Payer: Self-pay

## 2020-02-16 DIAGNOSIS — IMO0001 Reserved for inherently not codable concepts without codable children: Secondary | ICD-10-CM

## 2020-02-16 DIAGNOSIS — H918X1 Other specified hearing loss, right ear: Secondary | ICD-10-CM | POA: Diagnosis not present

## 2020-02-16 IMAGING — MR MR HEAD WO/W CM
10 of 14 series · 26 of 48 positions shown · IV contrast (gadavist)
Comparison: Head CT [DATE].

CLINICAL DATA: 78-year-old male with right side hearing loss for 1
year. Dizziness and congestion.

EXAM:
MRI HEAD WITHOUT AND WITH CONTRAST
TECHNIQUE: Multiplanar, multiecho pulse sequences of the brain and surrounding
structures were obtained without and with intravenous contrast.
CONTRAST:  9mL GADAVIST GADOBUTROL 1 MMOL/ML IV SOLN

[Series 5: T1 · sagittal · 5.0mm · 0.62mm/px · 3 of 25 slices shown (1 of 3)]
[im 1/25]
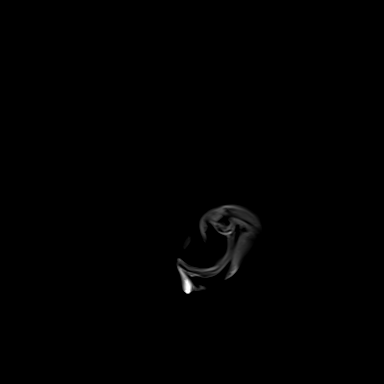
[im 13/25]
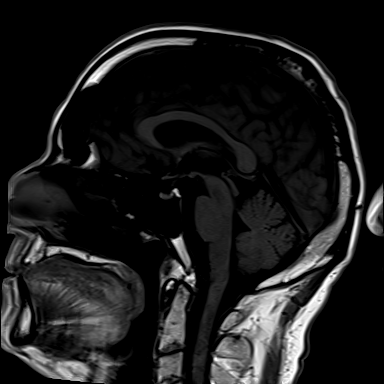
[im 25/25]
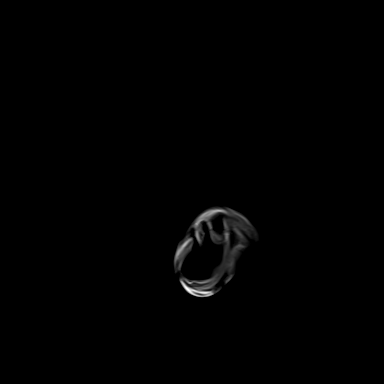

[Series 6: T2 · axial · 5.0mm · 0.53mm/px · z∈[-75,+77]mm · 2 of 27 slices shown]
[im 1/27]
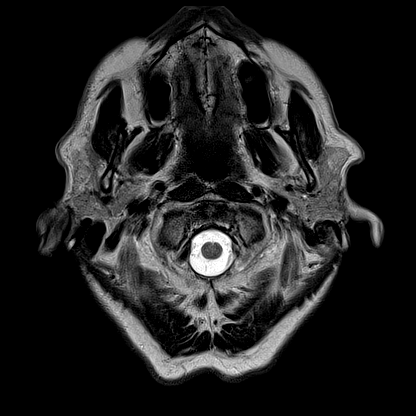
[im 27/27]
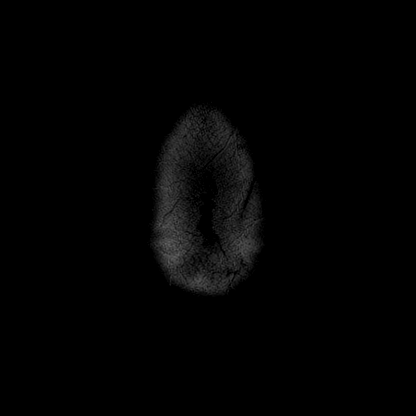

[Series 7: ax dwi_tracew · axial · 3.0mm · 0.60mm/px · z∈[-77,+75]mm · 3 of 48 slices shown]
[im 1/48]
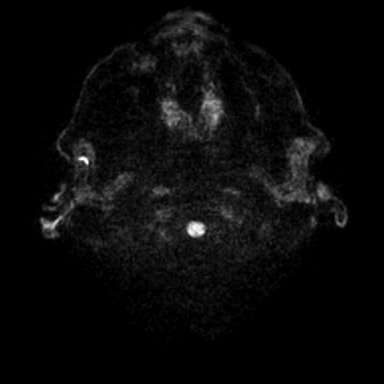
[im 24/48]
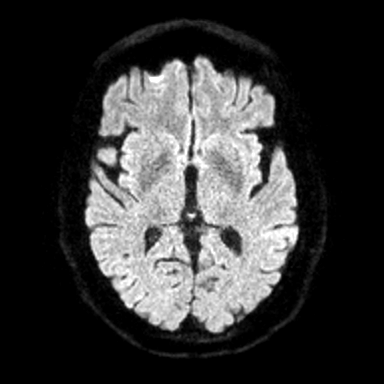
[im 48/48]
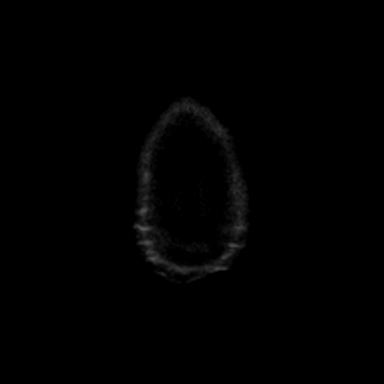

[Series 8: ax dwi_adc · axial · 3.0mm · 0.60mm/px · 1 of 48 slices shown]
[im 1/48]
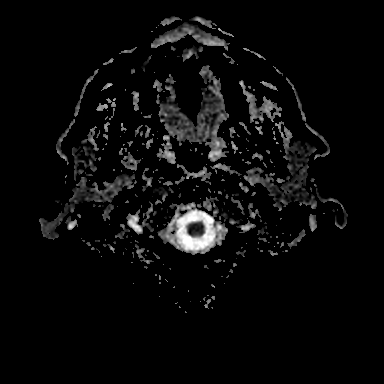

[Series 9: T1 · coronal · non-contrast · 3.0mm · 0.21mm/px · 1 of 13 slices shown (2 of 3)]
[im 1/13]
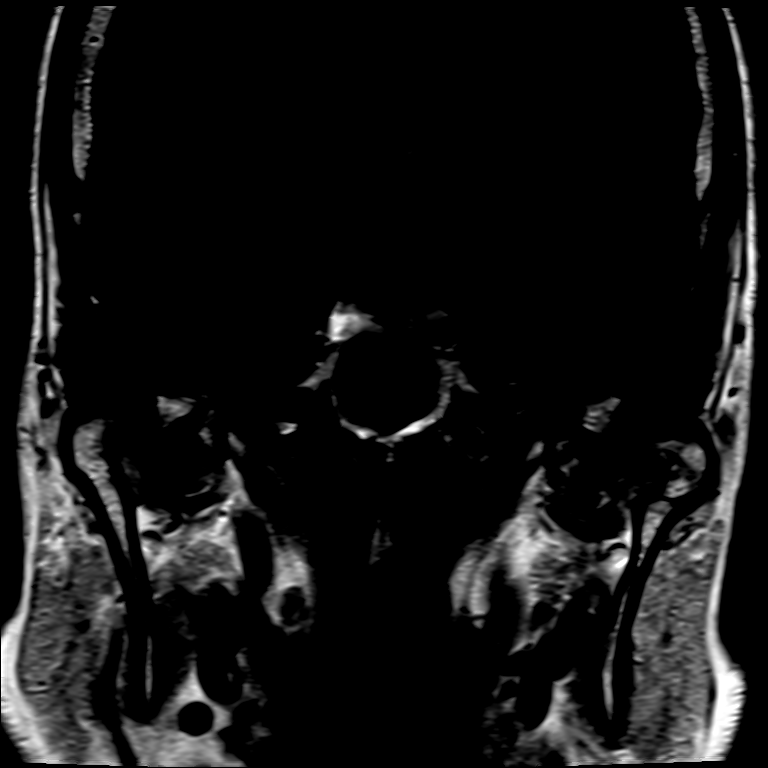

[Series 16: T1 · axial · non-contrast · 3.0mm · 0.21mm/px · 1 of 15 slices shown (3 of 3)]
[im 1/15]
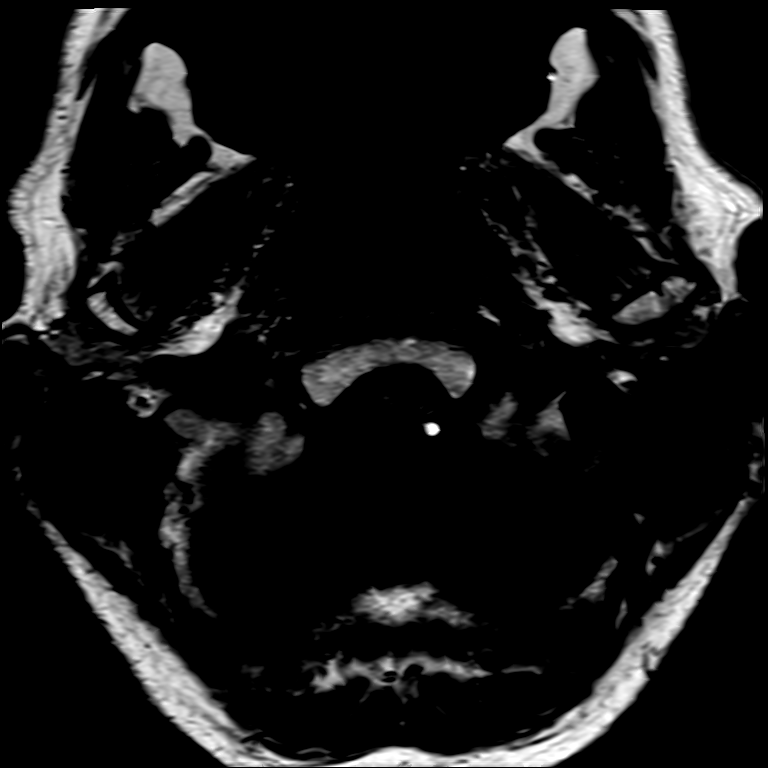

[Series 17: FLAIR · axial · 3.0mm · 0.53mm/px · z∈[-78,+80]mm · 4 of 55 slices shown]
[im 1/55]
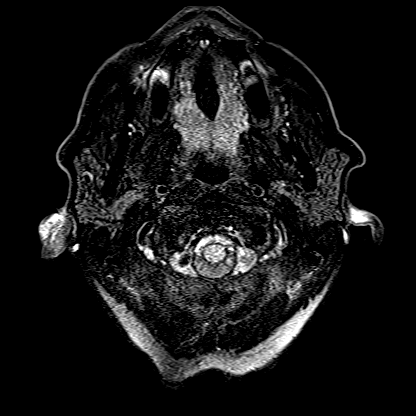
[im 19/55]
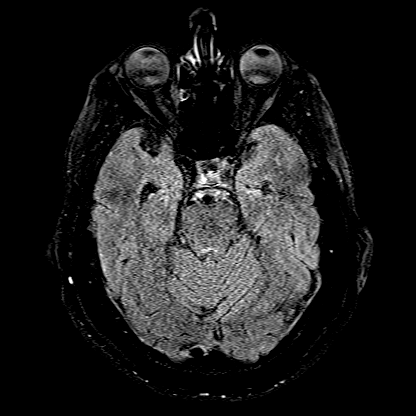
[im 37/55]
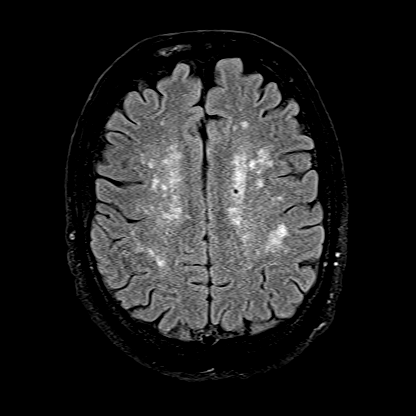
[im 55/55]
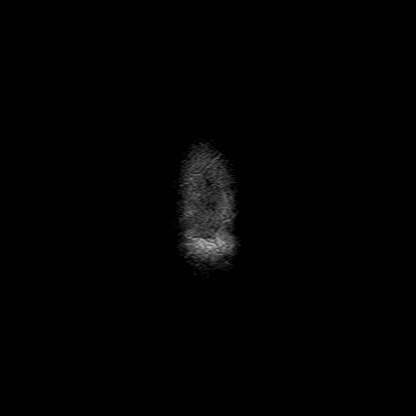

[Series 18: T1 post-contrast · axial · 3.0mm · 0.21mm/px · 1 of 15 slices shown (1 of 3)]
[im 1/15]
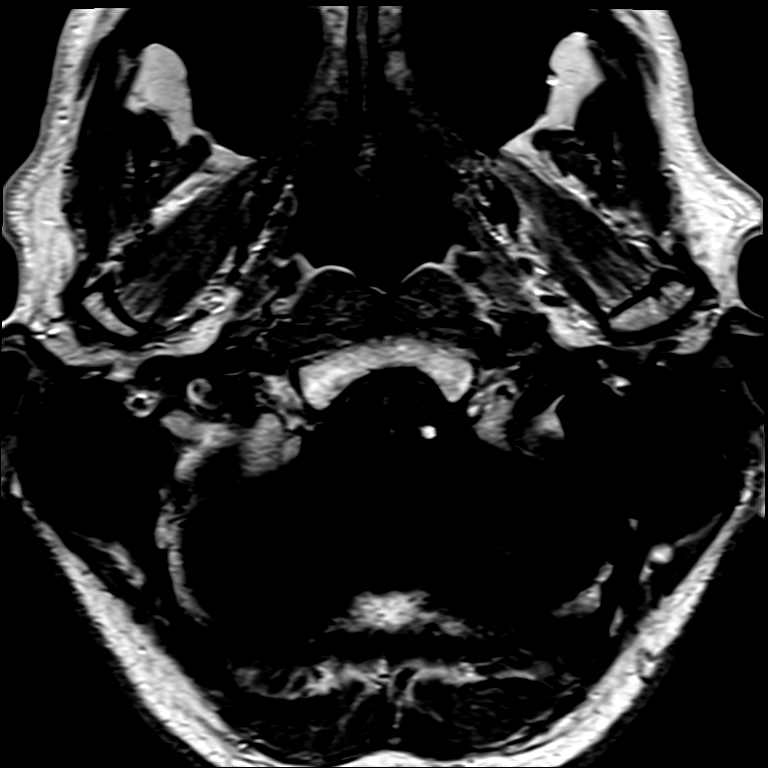

[Series 19: T1 post-contrast · coronal · 3.0mm · 0.21mm/px · 1 of 13 slices shown (2 of 3)]
[im 1/13]
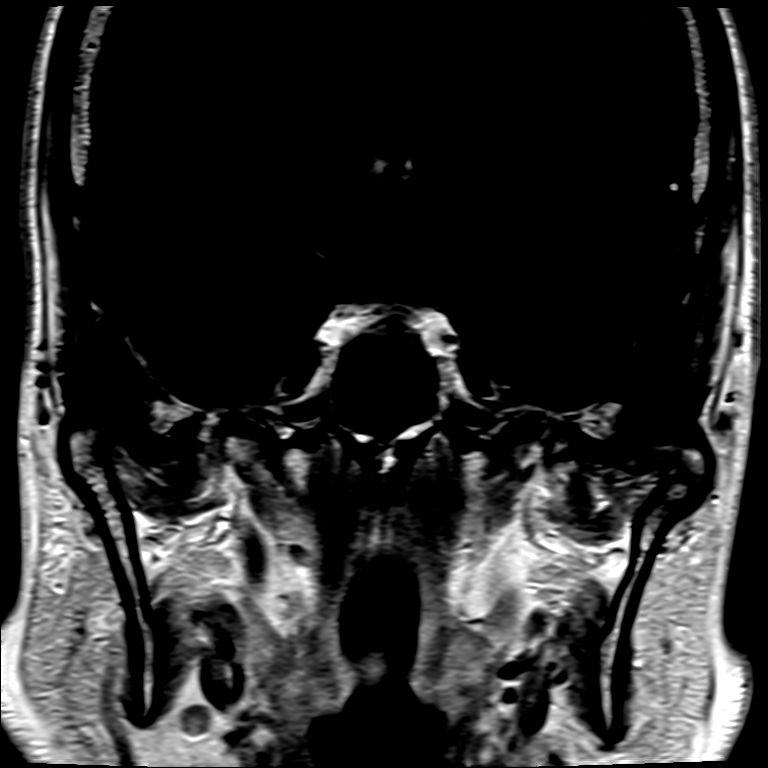

[Series 20: T1 post-contrast · axial · 1.0mm · 0.98mm/px · z∈[-88,+83]mm · 9 of 176 slices shown (3 of 3)]
[im 1/176]
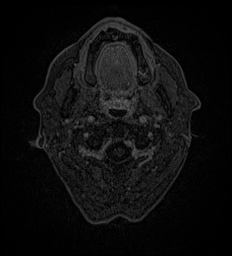
[im 30/176]
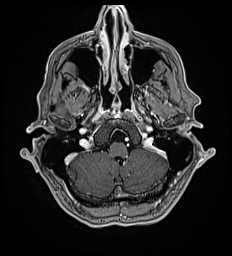
[im 59/176]
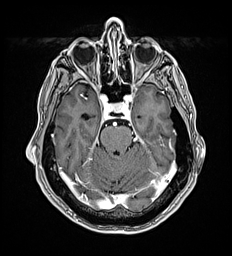
[im 73/176]
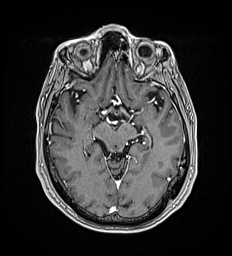
[im 88/176]
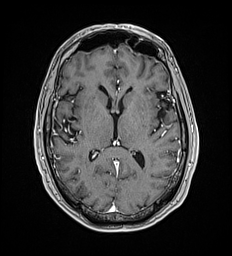
[im 103/176]
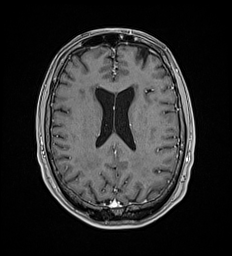
[im 117/176]
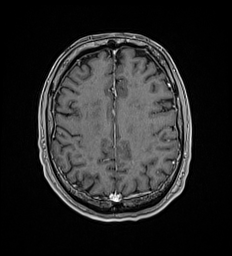
[im 146/176]
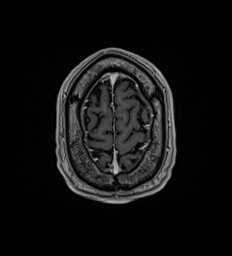
[im 176/176]
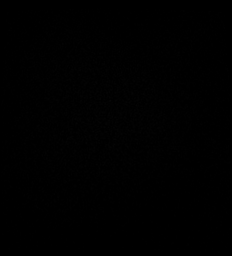

[26 of 48 positions shown; findings below may reference images not displayed]

FINDINGS: Brain: Cerebral volume is within normal limits for age. No
restricted diffusion to suggest acute infarction. No midline shift,
mass effect, evidence of mass lesion, ventriculomegaly, extra-axial
collection or acute intracranial hemorrhage. Cervicomedullary
junction and pituitary are within normal limits.

Widely scattered and patchy bilateral cerebral white matter T2 and
FLAIR hyperintensity. No superimposed chronic cerebral blood
products or cortical encephalomalacia. The deep gray nuclei,
brainstem and cerebellum are normal for age.

No abnormal enhancement identified. No dural thickening.

Vascular: Major intracranial vascular flow voids are preserved.
There is a degree of generalized intracranial artery tortuosity.

Skull and upper cervical spine: Normal for age visible cervical
spine, bone marrow signal.

Sinuses/Orbits: Negative orbits. Paranasal sinuses are clear.

Scalp and face soft tissues appear negative.

Other: Dedicated internal auditory imaging. Normal cerebellopontine
angles. Normal bilateral cisternal and intracanalicular 7th and 8th
cranial nerve segments. Symmetric T2 signal in the bilateral cochlea
and vestibular structures. Bilateral mastoids are clear. Normal
stylomastoid foramina. No abnormal enhancement identified. No skull
base abnormality identified. Parotid glands appear normal.
IMPRESSION: 1. Normal internal auditory imaging. No acute intracranial
abnormality.
2. Moderate for age cerebral white matter signal changes,
nonspecific but most commonly due to chronic small vessel disease.

## 2020-02-16 MED ORDER — GADOBUTROL 1 MMOL/ML IV SOLN
9.0000 mL | Freq: Once | INTRAVENOUS | Status: AC | PRN
  Administered 2020-02-16: 9 mL via INTRAVENOUS

## 2020-03-09 ENCOUNTER — Other Ambulatory Visit: Payer: Self-pay

## 2020-03-09 ENCOUNTER — Encounter: Payer: Self-pay | Admitting: Emergency Medicine

## 2020-03-09 ENCOUNTER — Ambulatory Visit
Admission: EM | Admit: 2020-03-09 | Discharge: 2020-03-09 | Disposition: A | Discharge status: Home / Self Care | Payer: Medicare PPO | Attending: Family Medicine | Admitting: Family Medicine

## 2020-03-09 DIAGNOSIS — J0110 Acute frontal sinusitis, unspecified: Secondary | ICD-10-CM

## 2020-03-09 MED ORDER — AMOXICILLIN 875 MG PO TABS: 875.0000 mg | ORAL_TABLET | Freq: Two times a day (BID) | ORAL | 0 refills | Status: DC

## 2020-03-09 NOTE — ED Provider Notes (Signed)
[facility]    CSN: [id number] Arrival date & time: 03/09/20  1433      History   Chief Complaint Chief Complaint  Patient presents with  . Sinus Problem  . Headache    HPI Arthur Romero is a 79 y.o. male.   79 yo male with a c/o sinus congestion, sinus headache and pressure for over 1 month. Denies any fevers, chills, chest pains, shortness of breath.    Sinus Problem Associated symptoms include headaches.  Headache   Past Medical History:  Diagnosis Date  . Hyperlipemia   . Hypertension   . Thyroid disease     There are no problems to display for this patient.   Past Surgical History:  Procedure Laterality Date  . CARDIAC SURGERY    . SHOULDER SURGERY Right   . TYMPANOPLASTY Right   . vocal cords         Home Medications    Prior to Admission medications   Medication Sig Start Date End Date Taking? Authorizing Provider  allopurinol (ZYLOPRIM) 300 MG tablet Take 300 mg by mouth daily.   Yes [provider]  atorvastatin (LIPITOR) 40 MG tablet Take 40 mg by mouth daily. 03/05/20  Yes [provider]  citalopram (CELEXA) 10 MG tablet Take 10 mg by mouth daily.   Yes [provider]  gabapentin (NEURONTIN) 400 MG capsule Take 400 mg by mouth 3 (three) times daily.   Yes [provider]  hydrochlorothiazide (HYDRODIURIL) 25 MG tablet TAKE 1 TABLET EVERY DAY 10/30/19  Yes [provider]  levothyroxine (SYNTHROID) 88 MCG tablet Take 88 mcg by mouth daily. 03/06/20  Yes [provider]  levothyroxine (SYNTHROID, LEVOTHROID) 50 MCG tablet Take 50 mcg by mouth daily before breakfast.   Yes [provider]  pravastatin (PRAVACHOL) 20 MG tablet Take 20 mg by mouth daily.   Yes [provider]  amLODipine-benazepril (LOTREL) 10-20 MG per capsule Take 1 capsule by mouth daily.    [provider]  amoxicillin (AMOXIL) 875 MG tablet Take 1 tablet (875 mg total) by mouth 2  (two) times daily. 03/09/20   Norval Gable, MD  cephALEXin (KEFLEX) 500 MG capsule Take 1 capsule (500 mg total) by mouth 2 (two) times daily. 07/03/18   Lorin Picket, PA-C  meloxicam (MOBIC) 7.5 MG tablet Take 1 tablet (7.5 mg total) by mouth daily. 03/22/18   Lorin Picket, PA-C  omeprazole (PRILOSEC) 20 MG capsule Take 20 mg by mouth daily. 03/05/20   [provider]  tizanidine (ZANAFLEX) 2 MG capsule Take 2 capsules (4 mg total) by mouth 3 (three) times daily. 03/22/18   Lorin Picket, PA-C    Family History Family History  Problem Relation Age of Onset  . Hypertension Mother   . Osteoarthritis Mother   . Heart attack Father   . Tuberculosis Father     Social History Social History   Tobacco Use  . Smoking status: Never Smoker  . Smokeless tobacco: Never Used  Substance Use Topics  . Alcohol use: Yes    Alcohol/week: 1.0 standard drinks    Types: 1 Glasses of wine per week  . Drug use: Never     Allergies   Prednisone   Review of Systems Review of Systems  Neurological: Positive for headaches.     Physical Exam Triage Vital Signs ED Triage Vitals  Enc Vitals Group     BP 03/09/20 1453 (!) 152/97  Pulse Rate 03/09/20 1453 82     Resp 03/09/20 1453 16     Temp 03/09/20 1453 98.2 F (36.8 C)     Temp Source 03/09/20 1453 Oral     SpO2 03/09/20 1453 97 %     Weight 03/09/20 1447 200 lb (90.7 kg)     Height 03/09/20 1447 6\' 3"  (1.905 m)     Head Circumference --      Peak Flow --      Pain Score 03/09/20 1446 5     Pain Loc --      Pain Edu? --      Excl. in [place]? --    No data found.  Updated Vital Signs BP (!) 152/97 (BP Location: Left Arm)   Pulse 82   Temp 98.2 F (36.8 C) (Oral)   Resp 16   Ht 6\' 3"  (1.905 m)   Wt 90.7 kg   SpO2 97%   BMI 25.00 kg/m   Visual Acuity Right Eye Distance:   Left Eye Distance:   Bilateral Distance:    Right Eye Near:   Left Eye Near:    Bilateral Near:     Physical Exam Vitals  reviewed.  Constitutional:      General: He is not in acute distress.    Appearance: He is not toxic-appearing or diaphoretic.  HENT:     Right Ear: Tympanic membrane normal.     Left Ear: Tympanic membrane normal.     Nose:     Right Sinus: Maxillary sinus tenderness and frontal sinus tenderness present.     Left Sinus: Maxillary sinus tenderness and frontal sinus tenderness present.  Cardiovascular:     Rate and Rhythm: Normal rate.  Pulmonary:     Effort: Pulmonary effort is normal. No respiratory distress.     Breath sounds: Normal breath sounds. No stridor. No wheezing, rhonchi or rales.  Neurological:     Mental Status: He is alert.      UC Treatments / Results  Labs (all labs ordered are listed, but only abnormal results are displayed) Labs Reviewed - No data to display  EKG   Radiology No results found.  Procedures Procedures (including critical care time)  Medications Ordered in UC Medications - No data to display  Initial Impression / Assessment and Plan / UC Course  I have reviewed the triage vital signs and the nursing notes.  Pertinent labs & imaging results that were available during my care of the patient were reviewed by me and considered in my medical decision making (see chart for details).      Final Clinical Impressions(s) / UC Diagnoses   Final diagnoses:  Acute frontal sinusitis, recurrence not specified    ED Prescriptions    Medication Sig Dispense Auth. Provider   amoxicillin (AMOXIL) 875 MG tablet Take 1 tablet (875 mg total) by mouth 2 (two) times daily. 20 tablet Norval Gable, MD      1. diagnosis reviewed with patient 2. rx as per orders above; reviewed possible side effects, interactions, risks and benefits  3. Recommend supportive treatment with otc nasal spray 4. Follow-up prn if symptoms worsen or don't improve  PDMP not reviewed this encounter.   Norval Gable, MD 03/09/20 [phone]

## 2020-03-09 NOTE — ED Triage Notes (Signed)
Patient c/o sinus congestion and headache off and on for the past 3 months.  Patient denies fevers.

## 2020-03-15 ENCOUNTER — Encounter: Payer: Self-pay | Admitting: Family Medicine

## 2020-03-15 ENCOUNTER — Ambulatory Visit: Payer: Medicare PPO | Admitting: Family Medicine

## 2020-03-15 ENCOUNTER — Other Ambulatory Visit: Payer: Self-pay

## 2020-03-15 VITALS — BP 130/75 | HR 69 | Temp 97.1°F | Ht 75.0 in | Wt 188.0 lb

## 2020-03-15 DIAGNOSIS — I10 Essential (primary) hypertension: Secondary | ICD-10-CM | POA: Insufficient documentation

## 2020-03-15 DIAGNOSIS — R7309 Other abnormal glucose: Secondary | ICD-10-CM

## 2020-03-15 DIAGNOSIS — J0110 Acute frontal sinusitis, unspecified: Secondary | ICD-10-CM

## 2020-03-15 DIAGNOSIS — E039 Hypothyroidism, unspecified: Secondary | ICD-10-CM | POA: Insufficient documentation

## 2020-03-15 DIAGNOSIS — Z7689 Persons encountering health services in other specified circumstances: Secondary | ICD-10-CM | POA: Diagnosis not present

## 2020-03-15 DIAGNOSIS — E782 Mixed hyperlipidemia: Secondary | ICD-10-CM | POA: Diagnosis not present

## 2020-03-15 DIAGNOSIS — G589 Mononeuropathy, unspecified: Secondary | ICD-10-CM

## 2020-03-15 DIAGNOSIS — M216X2 Other acquired deformities of left foot: Secondary | ICD-10-CM | POA: Insufficient documentation

## 2020-03-15 DIAGNOSIS — M216X1 Other acquired deformities of right foot: Secondary | ICD-10-CM

## 2020-03-15 MED ORDER — AMLODIPINE BESYLATE 10 MG PO TABS: 10.0000 mg | ORAL_TABLET | Freq: Every day | ORAL | 2 refills | Status: DC

## 2020-03-15 MED ORDER — GABAPENTIN 100 MG PO CAPS: ORAL_CAPSULE | ORAL | 1 refills | Status: DC

## 2020-03-15 NOTE — Patient Instructions (Addendum)
Thank you for coming to the office today.  Finish Amoxicillin 5 days left. Call if not improving or new changes.  Stop Hydrochlorothiazide (HCTZ) Start on Amlodipine 10mg  daily once daily in AM, new med  Start Gabapentin 100mg  capsules, take at night for 2-3 nights only, and then increase to 2 times a day for a few days, and then may increase to 3 times a day, it may make you drowsy, if helps significantly at night only, then you can increase instead to 3 capsules at night, instead of 3 times a day - In the future if needed, we can significantly increase the dose if tolerated well, some common doses are 300mg  three times a day up to 600mg  three times a day, usually it takes several weeks or months to get to higher doses  Continue Levothyroxine 80mcg once daily  DUE for FASTING BLOOD WORK (no food or drink after midnight before the lab appointment, only water or coffee without cream/sugar on the morning of)  SCHEDULE "Lab Only" visit in the morning at the clinic for lab draw in 1 WEEK    Please schedule a Follow-up Appointment to: Return in about 6 weeks (around 04/26/2020) for 6 week follow-up HTN / Neurology.  If you have any other questions or concerns, please feel free to call the office or send a message through [place]. You may also schedule an earlier appointment if necessary.  Additionally, you may be receiving a survey about your experience at our office within a few days to 1 week by e-mail or mail. We value your feedback.  [name], DO [place]

## 2020-03-15 NOTE — Progress Notes (Signed)
Subjective:    Patient ID: Arthur Romero, male    DOB: October 17, 1941, 79 y.o.   MRN: [id number]  KAITLYN MCELHENNY is a 79 y.o. male presenting on 03/15/2020 for Establish Care (05/15 went to Urgent Care for sinusitis still not feeling good) and Numbness (Left foot)  Previous PCP Dr Rolland Porter at [place] in [facility]  HPI   Sinusitis Urgent Care Follow-up Last seen 03/09/20 by [facility] Had sinus congestion pain for 1 month. No fever. Given Amoxicillin 875mg  BID x 10 days. Also seen [facility] 3 weeks ago had MRI - negative sinuses. Showed calcium build up by his report  Hypothyroidism Last lab TSH 2.476 (12/21/19) On levothyroxine 88 in past has changed often  GERD On Omeprazole 20mg  daily. Had prior Esophageal procedure to help resolve it.  CHRONIC HTN: Reports prior med amlodipine-benazepril, had ACEi allergy angioedema, off this for few years. On HCTZ for past 1-2 year uncertain if working well. Current Meds - HCTZ 25mg  daily   Reports good compliance, took meds today. Tolerating well, w/o complaints. 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 Started on Carbidopa-Levodopa 25-100mg  half pill TID for 3 days then inc to 1 pill TID. - He had dizziness on med. They reduced tab to half for TID - They ordered Nerve conduction studies  Mild elevated WBC  Falls, 2017, dislocated shoulder    Health Maintenance:  UTD COVID19 vaccine  PSA 0.60 (12/21/19)  Depression screen PHQ 2/9 03/15/2020  Decreased  Interest 0  Down, Depressed, Hopeless 0  PHQ - 2 Score 0    Past Medical History:  Diagnosis Date  . Arthritis   . GERD (gastroesophageal reflux disease)   . Hypertension   . Rheumatoid arthritis ([place])   . Thyroid disease    Past Surgical History:  Procedure Laterality Date  . CARDIAC SURGERY    . SHOULDER SURGERY Right   . TYMPANOPLASTY Right   . vocal cords  1989   polyps   Social History   Socioeconomic History  . Marital status: Single    Spouse name: Not on file  . Number of children: Not on file  . Years of education: Not on file  . Highest education level: Not on file  Occupational History  . Not on file  Tobacco Use  . Smoking status: Never Smoker  . Smokeless tobacco: Never Used  Substance and Sexual Activity  . Alcohol use: Yes    Alcohol/week: 1.0 standard drinks    Types: 1 Glasses of wine per week  . Drug use: Never  . Sexual activity: Not on file  Other Topics Concern  . Not on file  Social History Narrative  . Not on file   Social Determinants of Health   Financial Resource Strain:   . Difficulty of Paying Living Expenses:   Food Insecurity:   . Worried About Charity fundraiser in the Last Year:   . Arboriculturist in the Last Year:   Transportation Needs:   . Film/video editor (Medical):   Marland Kitchen Lack of Transportation (Non-Medical):  Physical Activity:   . Days of Exercise per Week:   . Minutes of Exercise per Session:   Stress:   . Feeling of Stress :   Social Connections:   . Frequency of Communication with Friends and Family:   . Frequency of Social Gatherings with Friends and Family:   . Attends Religious Services:   . Active Member of Clubs or Organizations:   . Attends Archivist Meetings:   Marland Kitchen Marital Status:   Intimate Partner Violence:   . Fear of Current or Ex-Partner:   . Emotionally Abused:   Marland Kitchen Physically Abused:   . Sexually Abused:    Family History  Problem Relation Age of Onset  . Hypertension  Mother   . Osteoarthritis Mother   . Heart attack Father   . Tuberculosis Father    Current Outpatient Medications on File Prior to Visit  Medication Sig  . allopurinol (ZYLOPRIM) 300 MG tablet Take 300 mg by mouth daily.  Marland Kitchen amoxicillin (AMOXIL) 875 MG tablet Take 1 tablet (875 mg total) by mouth 2 (two) times daily.  Marland Kitchen atorvastatin (LIPITOR) 40 MG tablet Take 40 mg by mouth daily.  Marland Kitchen levothyroxine (SYNTHROID) 88 MCG tablet Take 88 mcg by mouth daily.  Marland Kitchen omeprazole (PRILOSEC) 20 MG capsule Take 20 mg by mouth daily.  Marland Kitchen acetaminophen (TYLENOL) 500 MG tablet Take by mouth.   No current facility-administered medications on file prior to visit.    Review of Systems Per HPI unless specifically indicated above       Objective:    BP 130/75   Pulse 69   Temp (!) 97.1 F (36.2 C) (Temporal)   Ht 6\' 3"  (1.905 m)   Wt 188 lb (85.3 kg)   SpO2 100%   BMI 23.50 kg/m   Wt Readings from Last 3 Encounters:  03/15/20 188 lb (85.3 kg)  03/09/20 200 lb (90.7 kg)  07/03/18 200 lb (90.7 kg)    Physical Exam Vitals and nursing note reviewed.  Constitutional:      General: He is not in acute distress.    Appearance: He is well-developed. He is not diaphoretic.     Comments: Chronically ill but currently well appearing 79 year old male, with cane, comfortable, cooperative  HENT:     Head: Normocephalic and atraumatic.     Right Ear: Ear canal and external ear normal.     Left Ear: Tympanic membrane, ear canal and external ear normal.     Ears:     Comments: R TM has scar tissue, chronic Eyes:     General:        Right eye: No discharge.        Left eye: No discharge.     Conjunctiva/sclera: Conjunctivae normal.  Neck:     Thyroid: No thyromegaly.  Cardiovascular:     Rate and Rhythm: Normal rate and regular rhythm.     Heart sounds: Normal heart sounds. No murmur.  Pulmonary:     Effort: Pulmonary effort is normal. No respiratory distress.     Breath sounds: Normal breath  sounds. No wheezing or rales.  Musculoskeletal:        General: Normal range of motion.     Cervical back: Normal range of motion and neck supple.     Comments: Reduced muscle mass with some atrophy lower extremity and upper extremities.  Kyphotic posture of upper back  Lymphadenopathy:     Cervical: No cervical adenopathy.  Skin:  General: Skin is warm and dry.     Findings: No erythema or rash.  Neurological:     General: No focal deficit present.     Mental Status: He is alert and oriented to person, place, and time.     Sensory: Sensory deficit (monofilament sensation reducd left foot forefoot pad) present.     Gait: Gait abnormal (shuffling gait).     Comments: No obvious resting tremor today  Psychiatric:        Behavior: Behavior normal.     Comments: Well groomed, good eye contact, normal speech and thoughts     CLINICAL DATA:  79 year old male with right side hearing loss for 1 year. Dizziness and congestion.  EXAM: MRI HEAD WITHOUT AND WITH CONTRAST  TECHNIQUE: Multiplanar, multiecho pulse sequences of the brain and surrounding structures were obtained without and with intravenous contrast.  CONTRAST:  69mL GADAVIST GADOBUTROL 1 MMOL/ML IV SOLN  COMPARISON:  Head CT 07/03/2018.  FINDINGS: Brain: Cerebral volume is within normal limits for age. No restricted diffusion to suggest acute infarction. No midline shift, mass effect, evidence of mass lesion, ventriculomegaly, extra-axial collection or acute intracranial hemorrhage. Cervicomedullary junction and pituitary are within normal limits.  Widely scattered and patchy bilateral cerebral white matter T2 and FLAIR hyperintensity. No superimposed chronic cerebral blood products or cortical encephalomalacia. The deep gray nuclei, brainstem and cerebellum are normal for age.  No abnormal enhancement identified. No dural thickening.  Vascular: Major intracranial vascular flow voids are preserved. There  is a degree of generalized intracranial artery tortuosity.  Skull and upper cervical spine: Normal for age visible cervical spine, bone marrow signal.  Sinuses/Orbits: Negative orbits. Paranasal sinuses are clear.  Scalp and face soft tissues appear negative.  Other: Dedicated internal auditory imaging. Normal cerebellopontine angles. Normal bilateral cisternal and intracanalicular 7th and 8th cranial nerve segments. Symmetric T2 signal in the bilateral cochlea and vestibular structures. Bilateral mastoids are clear. Normal stylomastoid foramina. No abnormal enhancement identified. No skull base abnormality identified. Parotid glands appear normal.  IMPRESSION: 1. Normal internal auditory imaging. No acute intracranial abnormality. 2. Moderate for age cerebral white matter signal changes, nonspecific but most commonly due to chronic small vessel disease.   Electronically Signed   By: Genevie Ann M.D.   On: 02/18/2020 07:11  Results for orders placed or performed during the hospital encounter of 07/03/18  Urine culture   Specimen: Urine, Clean Catch  Result Value Ref Range   Specimen Description      URINE, CLEAN CATCH Performed at [facility], [address] [place], [place] [address]    Special Requests      NONE Performed at [facility], [address], [place], [place] [address]    Culture MULTIPLE SPECIES PRESENT, SUGGEST RECOLLECTION (A)    Report Status 07/05/2018 FINAL   CBC with Differential  Result Value Ref Range   WBC 10.9 (H) 3.8 - 10.6 K/uL   RBC 4.90 4.40 - 5.90 MIL/uL   Hemoglobin 15.3 13.0 - 18.0 g/dL   HCT 46.1 40.0 - 52.0 %   MCV 94.1 80.0 - 100.0 fL   MCH 31.2 26.0 - 34.0 pg   MCHC 33.2 32.0 - 36.0 g/dL   RDW 15.3 (H) 11.5 - 14.5 %   Platelets 266 150 - 440 K/uL   Neutrophils Relative % 73 %   Neutro Abs 8.1 (H) 1.4 - 6.5 K/uL   Lymphocytes Relative 17 %   Lymphs Abs 1.8 1.0 -  3.6 K/uL   Monocytes Relative 8 %     Monocytes Absolute 0.8 0.2 - 1.0 K/uL   Eosinophils Relative 1 %   Eosinophils Absolute 0.1 0 - 0.7 K/uL   Basophils Relative 1 %   Basophils Absolute 0.1 0 - 0.1 K/uL  Comprehensive metabolic panel  Result Value Ref Range   Sodium 138 135 - 145 mmol/L   Potassium 3.2 (L) 3.5 - 5.1 mmol/L   Chloride 98 98 - 111 mmol/L   CO2 28 22 - 32 mmol/L   Glucose, Bld 114 (H) 70 - 99 mg/dL   BUN 14 8 - 23 mg/dL   Creatinine, Ser 1.03 0.61 - 1.24 mg/dL   Calcium 9.4 8.9 - 10.3 mg/dL   Total Protein 7.9 6.5 - 8.1 g/dL   Albumin 4.5 3.5 - 5.0 g/dL   AST 25 15 - 41 U/L   ALT 22 0 - 44 U/L   Alkaline Phosphatase 66 38 - 126 U/L   Total Bilirubin 0.6 0.3 - 1.2 mg/dL   GFR calc non Af Amer >60 >60 mL/min   GFR calc Af Amer >60 >60 mL/min   Anion gap 12 5 - 15  Urinalysis, Complete w Microscopic  Result Value Ref Range   Color, Urine YELLOW YELLOW   APPearance CLEAR CLEAR   Specific Gravity, Urine 1.015 1.005 - 1.030   pH 7.0 5.0 - 8.0   Glucose, UA NEGATIVE NEGATIVE mg/dL   Hgb urine dipstick NEGATIVE NEGATIVE   Bilirubin Urine NEGATIVE NEGATIVE   Ketones, ur NEGATIVE NEGATIVE mg/dL   Protein, ur NEGATIVE NEGATIVE mg/dL   Nitrite NEGATIVE NEGATIVE   Leukocytes, UA SMALL (A) NEGATIVE   Squamous Epithelial / LPF NONE SEEN 0 - 5   WBC, UA 21-50 0 - 5 WBC/hpf   RBC / HPF 0-5 0 - 5 RBC/hpf   Bacteria, UA NONE SEEN NONE SEEN   Mucus PRESENT    Hyaline Casts, UA PRESENT       Assessment & Plan:   Problem List Items Addressed This Visit    Mononeuropathy   Relevant Medications   gabapentin (NEURONTIN) 100 MG capsule   Mixed hyperlipidemia   Relevant Medications   amLODipine (NORVASC) 10 MG tablet   Other Relevant Orders   TSH   T4, free   Essential hypertension - Primary   Relevant Medications   amLODipine (NORVASC) 10 MG tablet   Other Relevant Orders   CBC with Differential/Platelet   BASIC METABOLIC PANEL WITH GFR   Elevated hemoglobin A1c   Acquired hypothyroidism    Relevant Orders   TSH   T4, free   Acquired bilateral pes cavus   Relevant Medications   gabapentin (NEURONTIN) 100 MG capsule    Other Visit Diagnoses    Encounter to establish care with new doctor          Review outside records.  #HTN Currently BP controlled No edema. On Thiazide, has some urinary frequency by history on this med overall. Did well on prior med amlodipine-acei but had allergy angioedema to acei May have electrolyte imbalance on thiazide Will return next week check labs BMET DC HCTZ - switch to Amlodipine 10mg  monotherapy  #Hypothyroidism Last normal 11/2019 - 2.47 but has had variable readings in past Currently some symptoms may be attributed to hypothyroid Will re-check, thyroid labs next week and in meantime continue Levothyroxine 88  #Sinusitis, subacute frontal Uncertain if actual sinus infection, has seen [facility] 3 weeks ago had MRI 01/2020  negative. He is on Amoxicillin high dose BID x 10 days, possible improvement, not worsening, has 5 more days, may finish course. No new antibiotics  #Neurology / tremors / falls / weakness / peripheral neuropathy - Established with [facility] Uncertain clear diagnosis, their clinical concern is for parkinsonism, trial on therapy has been mixed so far with side effects. Awaiting Nerve conduction testing and further management at upcoming follow-up - MRI shows weight matter changes, moderate - Advised he can follow with neuro for now still  #Pes Cavus Seems chronic architecture of feet abnormal, has some peripheral neuropathy worse in forefoot pad May consider podiatry and orthotics    Meds ordered this encounter  Medications  . amLODipine (NORVASC) 10 MG tablet    Sig: Take 1 tablet (10 mg total) by mouth daily.    Dispense:  30 tablet    Refill:  2  . gabapentin (NEURONTIN) 100 MG capsule    Sig: Start 1 capsule daily at bed time, increase by 1 cap every 2-3 days as tolerated up to take 3 at once in  evening.    Dispense:  90 capsule    Refill:  1   Orders Placed This Encounter  Procedures  . TSH    Standing Status:   Future    Standing Expiration Date:   05/26/2020  . T4, free    Standing Status:   Future    Standing Expiration Date:   05/26/2020  . CBC with Differential/Platelet    Standing Status:   Future    Standing Expiration Date:   05/26/2020  . BASIC METABOLIC PANEL WITH GFR    Standing Status:   Future    Standing Expiration Date:   05/26/2020     Follow up plan: Return in about 6 weeks (around 04/26/2020) for 6 week follow-up HTN / Neurology.  [name], DO [facility] 03/15/2020, 3:25 PM

## 2020-03-18 ENCOUNTER — Other Ambulatory Visit: Payer: Self-pay

## 2020-03-18 ENCOUNTER — Other Ambulatory Visit: Payer: Medicare PPO

## 2020-03-18 DIAGNOSIS — I10 Essential (primary) hypertension: Secondary | ICD-10-CM

## 2020-03-18 DIAGNOSIS — E782 Mixed hyperlipidemia: Secondary | ICD-10-CM

## 2020-03-18 DIAGNOSIS — E039 Hypothyroidism, unspecified: Secondary | ICD-10-CM

## 2020-03-18 LAB — CBC WITH DIFFERENTIAL/PLATELET
Absolute Monocytes: 790 cells/uL (ref 200–950)
Basophils Absolute: 50 cells/uL (ref 0–200)
Basophils Relative: 0.5 %
Eosinophils Absolute: 230 cells/uL (ref 15–500)
Eosinophils Relative: 2.3 %
HCT: 47.7 % (ref 38.5–50.0)
Hemoglobin: 15.7 g/dL (ref 13.2–17.1)
Lymphs Abs: 2020 cells/uL (ref 850–3900)
MCH: 31.5 pg (ref 27.0–33.0)
MCHC: 32.9 g/dL (ref 32.0–36.0)
MCV: 95.8 fL (ref 80.0–100.0)
MPV: 9.2 fL (ref 7.5–12.5)
Monocytes Relative: 7.9 %
Neutro Abs: 6910 cells/uL (ref 1500–7800)
Neutrophils Relative %: 69.1 %
Platelets: 287 10*3/uL (ref 140–400)
RBC: 4.98 10*6/uL (ref 4.20–5.80)
RDW: 13.6 % (ref 11.0–15.0)
Total Lymphocyte: 20.2 %
WBC: 10 10*3/uL (ref 3.8–10.8)

## 2020-03-18 LAB — BASIC METABOLIC PANEL WITH GFR
BUN: 15 mg/dL (ref 7–25)
CO2: 28 mmol/L (ref 20–32)
Calcium: 9.8 mg/dL (ref 8.6–10.3)
Chloride: 102 mmol/L (ref 98–110)
Creat: 1 mg/dL (ref 0.70–1.18)
GFR, Est African American: 84 mL/min/{1.73_m2} (ref >=60)
GFR, Est Non African American: 73 mL/min/{1.73_m2} (ref >=60)
Glucose, Bld: 106 mg/dL — ABNORMAL HIGH (ref 65–99)
Potassium: 4.7 mmol/L (ref 3.5–5.3)
Sodium: 142 mmol/L (ref 135–146)

## 2020-03-18 LAB — TSH: TSH: 4.26 mIU/L (ref 0.40–4.50)

## 2020-03-18 LAB — T4, FREE: Free T4: 1.4 ng/dL (ref 0.8–1.8)

## 2020-03-22 ENCOUNTER — Telehealth: Payer: Self-pay | Admitting: Family Medicine

## 2020-03-22 DIAGNOSIS — J0110 Acute frontal sinusitis, unspecified: Secondary | ICD-10-CM

## 2020-03-22 MED ORDER — LEVOFLOXACIN 500 MG PO TABS: 500.0000 mg | ORAL_TABLET | Freq: Every day | ORAL | 0 refills | Status: DC

## 2020-03-22 NOTE — Telephone Encounter (Signed)
Informed patient as per Dr.K but he thinks ENT has send him to diff specialist instead of helping him out but will follow the instruction as per Dr.K

## 2020-03-22 NOTE — Telephone Encounter (Signed)
Please notify patient  Sent new rx Levaquin 500mg  tab once daily for 7 days.  If unresolved after this antibiotic. He should return to his ENT.  [name], [facility] 03/22/2020, 3:13 PM

## 2020-03-22 NOTE — Telephone Encounter (Signed)
PEC transferred call back to relay the test result, patient notified for the lab result but concerned about his sinus pain around eyebrow is coming back after finishing round of Abx, please suggest ?

## 2020-03-27 ENCOUNTER — Other Ambulatory Visit: Payer: Self-pay

## 2020-03-27 MED ORDER — LEVOTHYROXINE SODIUM 88 MCG PO TABS: 88.0000 ug | ORAL_TABLET | Freq: Every day | ORAL | 3 refills | Status: DC

## 2020-04-17 ENCOUNTER — Other Ambulatory Visit: Payer: Self-pay | Admitting: Family Medicine

## 2020-04-17 DIAGNOSIS — K219 Gastro-esophageal reflux disease without esophagitis: Secondary | ICD-10-CM

## 2020-04-17 DIAGNOSIS — I10 Essential (primary) hypertension: Secondary | ICD-10-CM

## 2020-04-17 DIAGNOSIS — M216X1 Other acquired deformities of right foot: Secondary | ICD-10-CM

## 2020-04-17 DIAGNOSIS — G589 Mononeuropathy, unspecified: Secondary | ICD-10-CM

## 2020-04-17 DIAGNOSIS — E782 Mixed hyperlipidemia: Secondary | ICD-10-CM

## 2020-04-18 MED ORDER — OMEPRAZOLE 20 MG PO CPDR: 20.0000 mg | DELAYED_RELEASE_CAPSULE | Freq: Every day | ORAL | 1 refills | Status: DC

## 2020-04-18 MED ORDER — ATORVASTATIN CALCIUM 40 MG PO TABS: 40.0000 mg | ORAL_TABLET | Freq: Every day | ORAL | 1 refills | Status: DC

## 2020-04-18 MED ORDER — AMLODIPINE BESYLATE 10 MG PO TABS: 10.0000 mg | ORAL_TABLET | Freq: Every day | ORAL | 1 refills | Status: DC

## 2020-04-18 MED ORDER — GABAPENTIN 100 MG PO CAPS: ORAL_CAPSULE | ORAL | 1 refills | Status: DC

## 2020-04-19 ENCOUNTER — Other Ambulatory Visit: Payer: Self-pay

## 2020-04-19 ENCOUNTER — Emergency Department
Admission: EM | Admit: 2020-04-19 | Discharge: 2020-04-19 | Disposition: A | Discharge status: Home / Self Care | Payer: Medicare PPO | Attending: Emergency Medicine | Admitting: Emergency Medicine

## 2020-04-19 ENCOUNTER — Emergency Department: Payer: Medicare PPO

## 2020-04-19 ENCOUNTER — Encounter: Payer: Self-pay | Admitting: Emergency Medicine

## 2020-04-19 DIAGNOSIS — R5383 Other fatigue: Secondary | ICD-10-CM | POA: Diagnosis present

## 2020-04-19 DIAGNOSIS — R109 Unspecified abdominal pain: Secondary | ICD-10-CM | POA: Insufficient documentation

## 2020-04-19 DIAGNOSIS — I10 Essential (primary) hypertension: Secondary | ICD-10-CM | POA: Diagnosis not present

## 2020-04-19 DIAGNOSIS — R531 Weakness: Secondary | ICD-10-CM | POA: Diagnosis not present

## 2020-04-19 LAB — COMPREHENSIVE METABOLIC PANEL
ALT: 26 U/L (ref 0–44)
AST: 23 U/L (ref 15–41)
Albumin: 4.5 g/dL (ref 3.5–5.0)
Alkaline Phosphatase: 88 U/L (ref 38–126)
Anion gap: 13 (ref 5–15)
BUN: 11 mg/dL (ref 8–23)
CO2: 25 mmol/L (ref 22–32)
Calcium: 9.4 mg/dL (ref 8.9–10.3)
Chloride: 100 mmol/L (ref 98–111)
Creatinine, Ser: 0.9 mg/dL (ref 0.61–1.24)
GFR calc Af Amer: >60 mL/min (ref >60)
GFR calc non Af Amer: >60 mL/min (ref >60)
Glucose, Bld: 153 mg/dL — ABNORMAL HIGH (ref 70–99)
Potassium: 3.7 mmol/L (ref 3.5–5.1)
Sodium: 138 mmol/L (ref 135–145)
Total Bilirubin: 0.7 mg/dL (ref 0.3–1.2)
Total Protein: 7.8 g/dL (ref 6.5–8.1)

## 2020-04-19 LAB — CBC
HCT: 46.6 % (ref 39.0–52.0)
Hemoglobin: 16.1 g/dL (ref 13.0–17.0)
MCH: 31.6 pg (ref 26.0–34.0)
MCHC: 34.5 g/dL (ref 30.0–36.0)
MCV: 91.4 fL (ref 80.0–100.0)
Platelets: 273 10*3/uL (ref 150–400)
RBC: 5.1 MIL/uL (ref 4.22–5.81)
RDW: 13.8 % (ref 11.5–15.5)
WBC: 10.1 10*3/uL (ref 4.0–10.5)
nRBC: 0 % (ref 0.0–0.2)

## 2020-04-19 LAB — URINALYSIS, COMPLETE (UACMP) WITH MICROSCOPIC
Bacteria, UA: NONE SEEN
Bilirubin Urine: NEGATIVE
Glucose, UA: NEGATIVE mg/dL
Ketones, ur: NEGATIVE mg/dL
Leukocytes,Ua: NEGATIVE
Nitrite: NEGATIVE
Protein, ur: NEGATIVE mg/dL
Specific Gravity, Urine: 1.003 — ABNORMAL LOW (ref 1.005–1.030)
Squamous Epithelial / HPF: NONE SEEN (ref 0–5)
WBC, UA: NONE SEEN WBC/hpf (ref 0–5)
pH: 8 (ref 5.0–8.0)

## 2020-04-19 LAB — TROPONIN I (HIGH SENSITIVITY): Troponin I (High Sensitivity): 4 ng/L (ref <18)

## 2020-04-19 IMAGING — CT CT RENAL STONE PROTOCOL
2 of 4 series · 15 of 46 positions shown, 17 images · non-contrast
Comparison: None.

CLINICAL DATA: Evaluate for kidney stone.

EXAM:
CT ABDOMEN AND PELVIS WITHOUT CONTRAST
TECHNIQUE: Multidetector CT imaging of the abdomen and pelvis was performed
following the standard protocol without IV contrast.

[Series 2: stone full standard · axial · 0.83mm/px · z∈[-475,+10]mm · 12 of 107 slices shown, 14 images]
[im 5/107  soft-tissue]
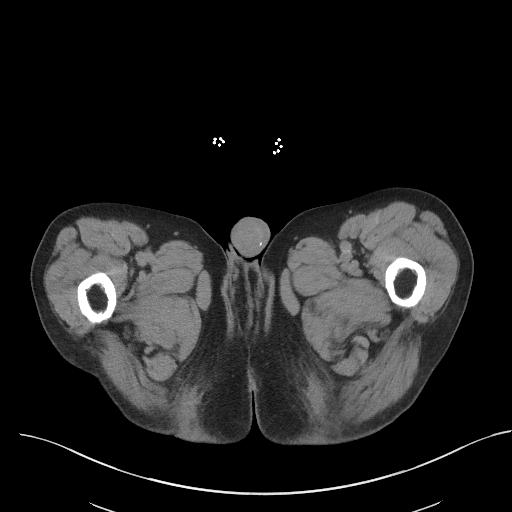
[im 5/107  bone]
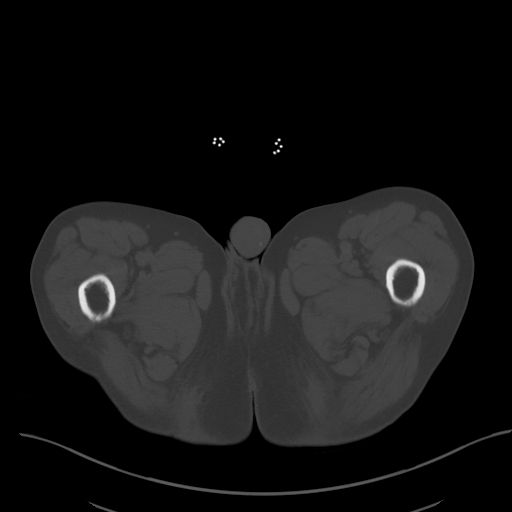
[im 14/107  soft-tissue]
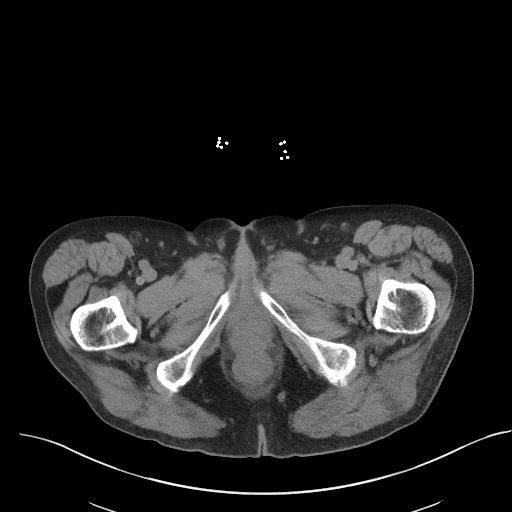
[im 24/107  soft-tissue]
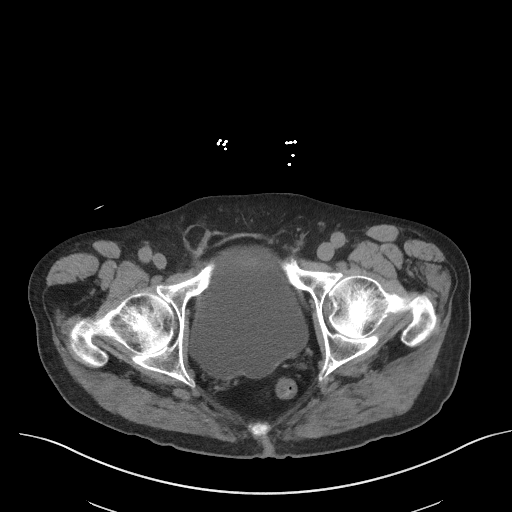
[im 33/107  soft-tissue]
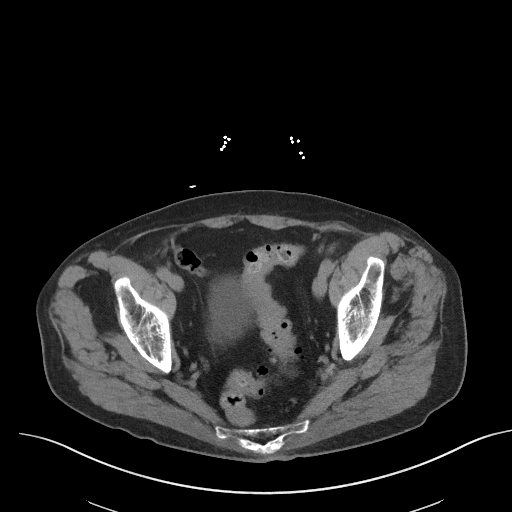
[im 42/107  soft-tissue]
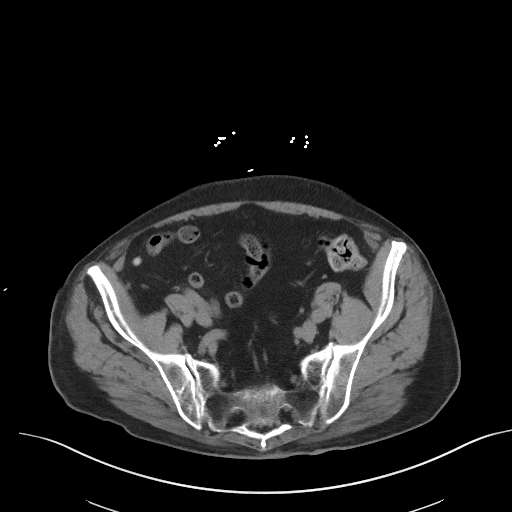
[im 51/107  soft-tissue]
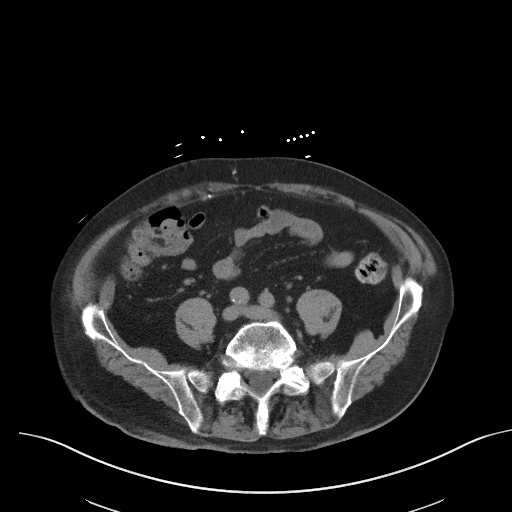
[im 56/107  soft-tissue]
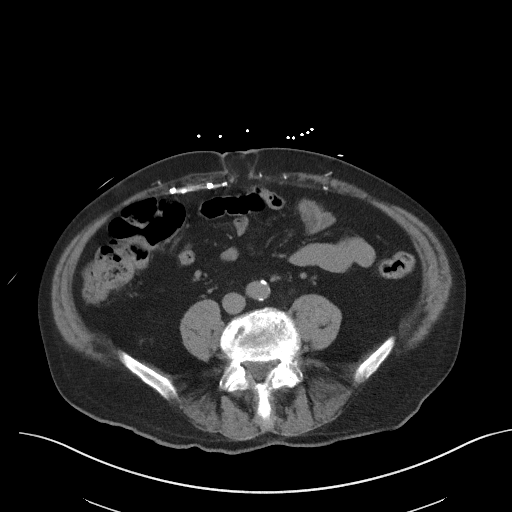
[im 65/107  soft-tissue]
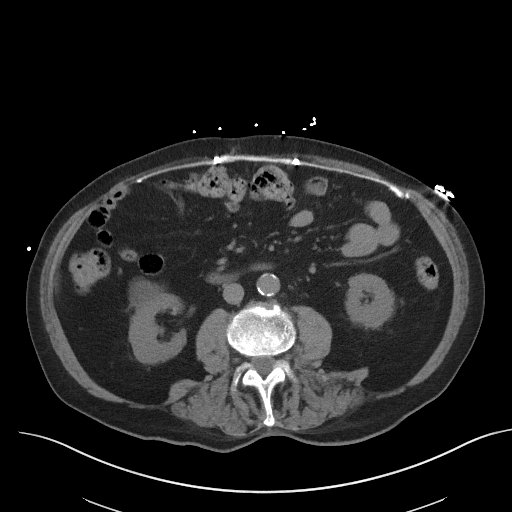
[im 74/107  soft-tissue]
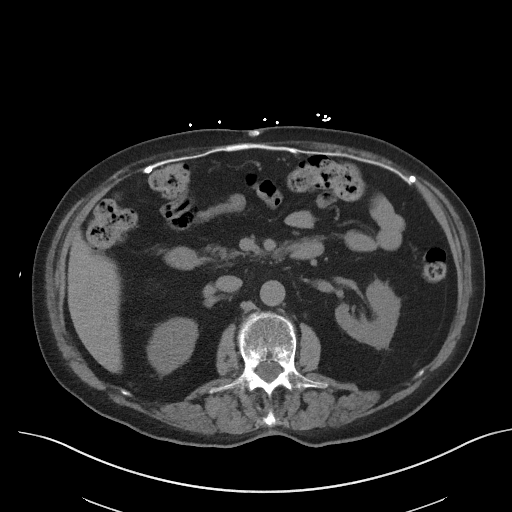
[im 74/107  bone]
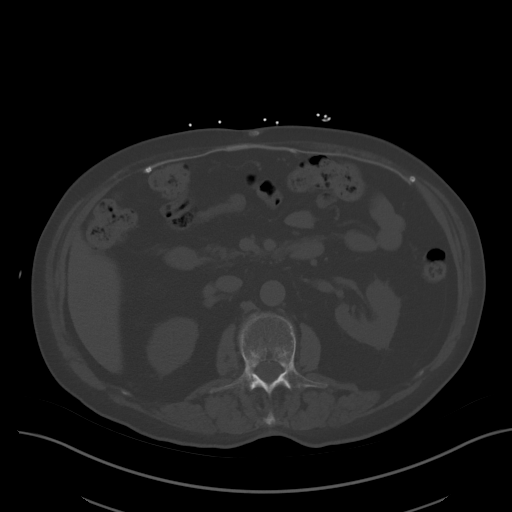
[im 83/107  soft-tissue]
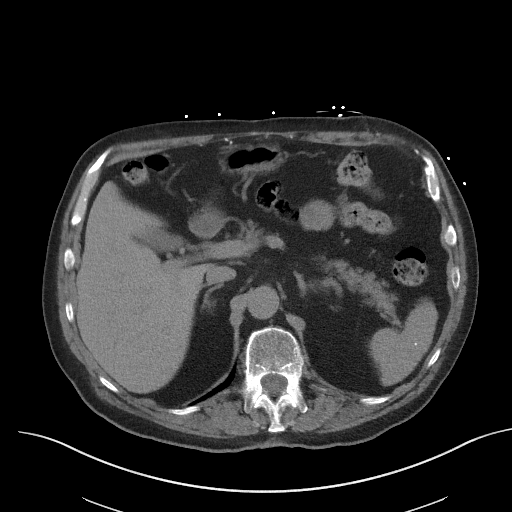
[im 93/107  soft-tissue]
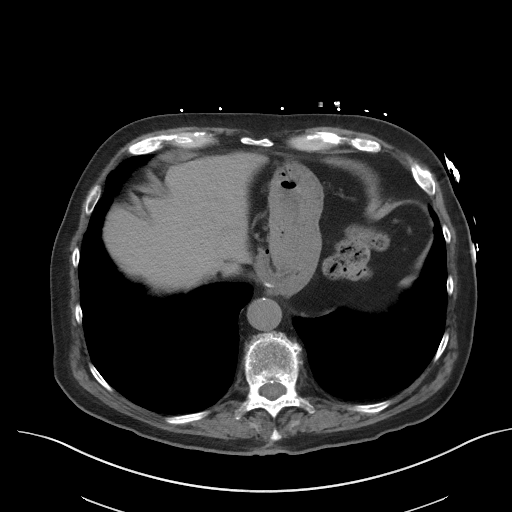
[im 102/107  soft-tissue]
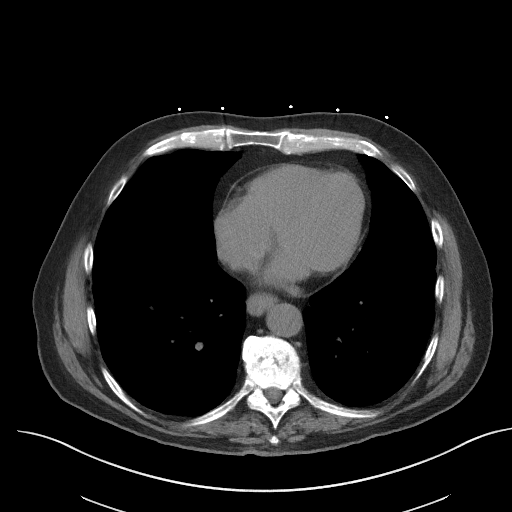

[Series 5: coronal · coronal · 0.84mm/px · 3 of 152 slices shown]
[im 51/152  soft-tissue]
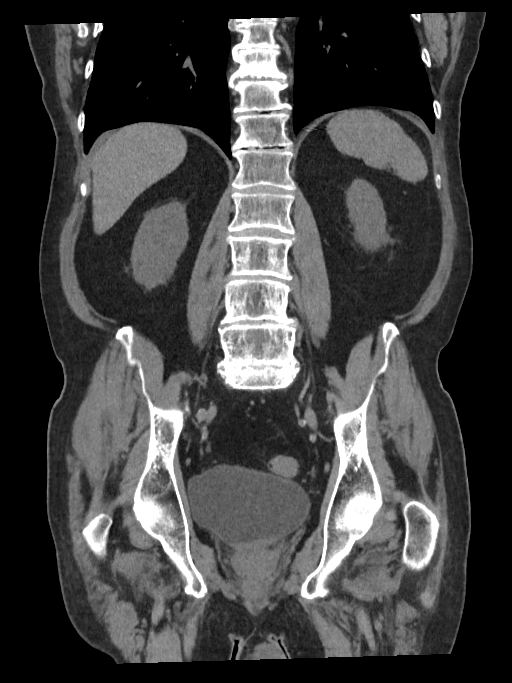
[im 68/152  soft-tissue]
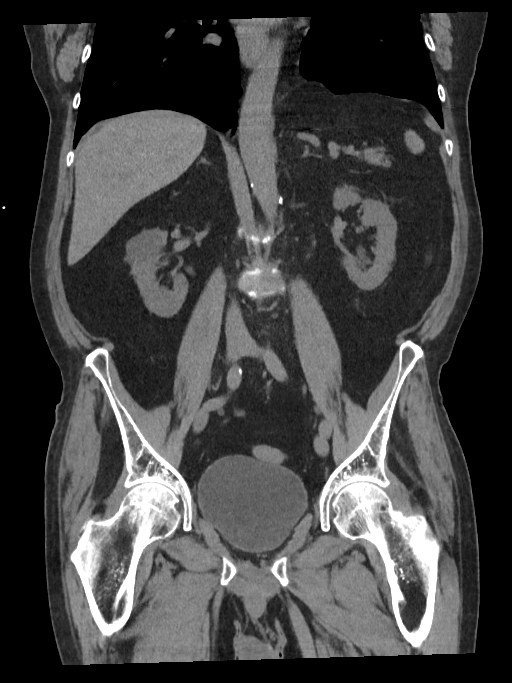
[im 84/152  soft-tissue]
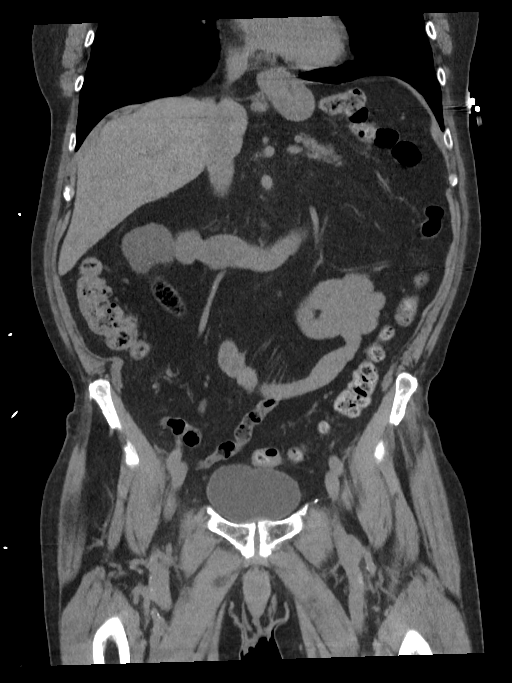

[15 of 46 positions shown; findings below may reference images not displayed]

FINDINGS: Lower chest: No acute abnormality.

Hepatobiliary: No focal liver abnormality is seen. No gallstones,
gallbladder wall thickening, or biliary dilatation.

Pancreas: There is a well-circumscribed, fluid attenuating cystic
lesion 1.2 cm arising from the undersurface of the pancreatic neck,
image [DATE]. No main duct dilatation, or inflammation.

Spleen: Normal in size without focal abnormality. Calcified splenic
granulomas noted.

Adrenals/Urinary Tract: Normal appearance of the adrenal glands.
Cyst is identified arising from the anterior cortex of the right
upper pole measuring 5.1 x 4.3 cm. Incompletely characterized
without IV contrast. No kidney stones or hydronephrosis. Urinary
bladder contains several small bladder wall diverticula, image 81/2
and image 82/2. No bladder calculi noted.

Stomach/Bowel: Small hiatal hernia. The small bowel loops are
nondilated. The appendix is visualized and appears normal. No bowel
wall thickening, distension or inflammation. Scattered distal
colonic diverticula noted without acute inflammation.

Vascular/Lymphatic: Aortic atherosclerosis. No aneurysm. No
abdominal or pelvic adenopathy.

Reproductive: Prostate is unremarkable.

Other: Small fat containing inguinal hernias. Previous ventral
abdominal wall herniorrhaphy. Fat containing umbilical hernia is
noted at the inferior margin of the hernia mesh.

Musculoskeletal: Multi level degenerative disc disease identified
throughout the visualized thoracic and lumbar spine. No acute or
suspicious bone lesions.
IMPRESSION: 1. No acute findings within the abdomen or pelvis. No urinary tract
calculi identified.
2. Right kidney cyst.
3. Incidental note of 1.2 cm benign appearing cyst arising from the
inferior aspect of the pancreatic neck. Follow-up imaging in 24
months with pancreas protocol MRI or CT without and with contrast
material is recommended. This recommendation follows ACR consensus
guidelines: Management of Incidental Pancreatic Cysts: A White Paper
of the ACR Incidental Findings Committee. [HOSPITAL]
[8C];[DATE].
4. Aortic atherosclerosis.

Aortic Atherosclerosis ([8C]-[8C]).

## 2020-04-19 MED ORDER — SODIUM CHLORIDE 0.9 % IV BOLUS
1000.0000 mL | Freq: Once | INTRAVENOUS | Status: AC
  Administered 2020-04-19: 1000 mL via INTRAVENOUS

## 2020-04-19 NOTE — ED Triage Notes (Signed)
Pt presents from home via [facility] with c/o generalized weakness and fatigue that has been ongoing for 3 days. Pt states "I just dont feel right". Pt c/o slight abdominal tenderness and reports not having bowel movement in 3 days. Pt endorses burning with urination as well. cbg 172 for ems. Pt currently alert and oriented x4. Pt recently prescribed antibiotics for sinus infection. Pt also recently began taking gabapentin.

## 2020-04-19 NOTE — ED Notes (Signed)
E-signature not working at this time. Pt verbalized understanding of D/C instructions, prescriptions and follow up care with no further questions at this time. Pt in NAD and wheeled to lobby at time of D/C.  

## 2020-04-19 NOTE — ED Provider Notes (Signed)
[facility] Emergency Department Provider Note  Time seen: 10:25 AM  I have reviewed the triage vital signs and the nursing notes.   HISTORY  Chief Complaint Fatigue   HPI Arthur Romero is a 79 y.o. male with a past medical history of hypertension, arthritis, gastric reflux, presents to the emergency department with generalized fatigue and weakness. According to the patient for the past 3 days he has been feeling somewhat weak, also states intermittent lower and right-sided abdominal discomfort. Patient states he was constipated for 3 days but took a suppository yesterday and had a bowel movement. Does state slight discomfort with urination. Denies any fever, denies dark urine, denies any cough congestion shortness of breath or chest pain. Overall patient appears well, no acute distress.  Past Medical History:  Diagnosis Date  . Arthritis   . GERD (gastroesophageal reflux disease)   . Hypertension   . 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 Thyroid disease     Patient Active Problem List   Diagnosis Date Noted  . Acquired hypothyroidism 03/15/2020  . Essential hypertension 03/15/2020  . 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 Mixed hyperlipidemia 03/15/2020  . Elevated hemoglobin A1c 03/15/2020  . 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 CARDIAC SURGERY    . SHOULDER SURGERY Right 2016  . TYMPANOPLASTY Right 1987  . vocal cords  1989   polyps    Prior to Admission medications   Medication Sig Start Date End Date Taking? Authorizing Provider  acetaminophen (TYLENOL) 500 MG tablet Take by mouth.    [provider]  allopurinol (ZYLOPRIM) 300 MG tablet Take 300 mg by mouth daily.    [provider]  amLODipine (NORVASC) 10 MG tablet Take 1 tablet (10 mg total) by mouth daily. 04/18/20   Karamalegos, Devonne Doughty, DO  amoxicillin (AMOXIL) 875 MG tablet Take 1 tablet (875 mg total) by mouth 2  (two) times daily. 03/09/20   Norval Gable, MD  atorvastatin (LIPITOR) 40 MG tablet Take 1 tablet (40 mg total) by mouth daily. 04/18/20   Karamalegos, Devonne Doughty, DO  gabapentin (NEURONTIN) 100 MG capsule Start 1 capsule daily at bed time, increase by 1 cap every 2-3 days as tolerated up to take 3 at once in evening. 04/18/20   Karamalegos, Devonne Doughty, DO  levofloxacin (LEVAQUIN) 500 MG tablet Take 1 tablet (500 mg total) by mouth daily. For 7 days 03/22/20   Olin Hauser, DO  levothyroxine (SYNTHROID) 88 MCG tablet Take 1 tablet (88 mcg total) by mouth daily. 03/27/20   Karamalegos, Devonne Doughty, DO  omeprazole (PRILOSEC) 20 MG capsule Take 1 capsule (20 mg total) by mouth daily. 04/18/20   Olin Hauser, DO    Allergies  Allergen Reactions  . Ace Inhibitors Swelling    Lip swelling Lip swelling Lip swelling   . Prednisone Hives    Family History  Problem Relation Age of Onset  . Hypertension Mother   . Osteoarthritis Mother   . Parkinson's disease Mother   . Heart attack Father 80  . Tuberculosis Father   . Stroke Brother   . Diabetes Brother     Social History Social History   Tobacco Use  . Smoking status: Never Smoker  . Smokeless tobacco: Never Used  Vaping Use  . Vaping Use: Never used  Substance Use Topics  . Alcohol use: Never    Alcohol/week: 1.0 standard drink    Types: 1 Glasses  of wine per week  . Drug use: Never    Review of Systems Constitutional: Negative for fever. Cardiovascular: Negative for chest pain. Respiratory: Negative for shortness of breath. Gastrointestinal: Slight lower and right-sided abdominal discomfort. Constipation now resolved. Genitourinary: Slight dysuria. Musculoskeletal: Negative for musculoskeletal complaints Neurological: Negative for headache All other ROS negative  ____________________________________________   PHYSICAL EXAM:  VITAL SIGNS: ED Triage Vitals  Enc Vitals Group     BP 04/19/20  1010 (!) 143/82     Pulse Rate 04/19/20 1010 78     Resp 04/19/20 1010 17     Temp 04/19/20 1010 98.7 F (37.1 C)     Temp Source 04/19/20 1010 Oral     SpO2 04/19/20 1010 96 %     Weight 04/19/20 1011 188 lb 0.8 oz (85.3 kg)     Height 04/19/20 1011 6\' 3"  (1.905 m)     Head Circumference --      Peak Flow --      Pain Score 04/19/20 1011 5     Pain Loc --      Pain Edu? --      Excl. in [place]? --     Constitutional: Alert and oriented. Well appearing and in no distress. Eyes: Normal exam ENT      Head: Normocephalic and atraumatic.      Mouth/Throat: Mucous membranes are moist. Cardiovascular: Normal rate, regular rhythm.  Respiratory: Normal respiratory effort without tachypnea nor retractions. Breath sounds are clear Gastrointestinal: Slight suprapubic more right-sided than left-sided tenderness to palpation. No rebound guarding or distention. Musculoskeletal: Nontender with normal range of motion in all extremities. Neurologic:  Normal speech and language. No gross focal neurologic deficits Skin:  Skin is warm, dry and intact.  Psychiatric: Mood and affect are normal.   ____________________________________________    EKG  EKG viewed and interpreted by myself shows a normal sinus rhythm at 71 bpm with a narrow QRS, normal axis, normal intervals, nonspecific ST changes.  ____________________________________________    RADIOLOGY  CT negative for acute abnormality.  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  ____________________________________________   INITIAL IMPRESSION / ASSESSMENT AND PLAN / ED COURSE  Pertinent labs & imaging results that were available during my care of the patient were reviewed by me and considered in my medical decision making (see chart for details).   Patient presents to the emergency department for generalized fatigue/weakness over the past 3 days. Overall the patient appears well he does state slight dysuria  with mild lower discomfort. Patient does have slight suprapubic tenderness on exam otherwise benign abdominal exam. We will check labs, urinalysis obtain a CT scan to further evaluate. Patient agreeable plan of care. Differential is quite broad would include infectious etiology such as urinary tract infection, metabolic or electrolyte abnormality, constipation colitis or diverticulitis less likely appendicitis or biliary pathology.  Patient's work-up is essentially negative.  Reassuring lab work, reassuring CT scan.  Urinalysis is normal.  We will IV hydrate.  Anticipate likely discharge home given reassuring work-up with PCP follow-up.  Arthur Romero was evaluated in Emergency Department on 04/19/2020 for the symptoms described in the history of present illness. He was evaluated in the context of the global COVID-19 pandemic, which necessitated consideration that the patient might be at risk for infection with the SARS-CoV-2 virus that causes COVID-19. Institutional protocols and algorithms that pertain to the evaluation of patients at risk for COVID-19 are in a state of  rapid change based on information released by regulatory bodies including the CDC and federal and state organizations. These policies and algorithms were followed during the patient's care in the ED.  ____________________________________________   FINAL CLINICAL IMPRESSION(S) / ED DIAGNOSES  Abdominal discomfort Weakness   Harvest Dark, MD 04/19/20 1210

## 2020-04-26 ENCOUNTER — Ambulatory Visit (INDEPENDENT_AMBULATORY_CARE_PROVIDER_SITE_OTHER): Payer: Medicare PPO | Admitting: Family Medicine

## 2020-04-26 ENCOUNTER — Encounter: Payer: Self-pay | Admitting: Family Medicine

## 2020-04-26 ENCOUNTER — Other Ambulatory Visit: Payer: Self-pay

## 2020-04-26 VITALS — BP 130/71 | HR 73 | Temp 97.1°F | Resp 16 | Ht 75.0 in | Wt 178.6 lb

## 2020-04-26 DIAGNOSIS — J321 Chronic frontal sinusitis: Secondary | ICD-10-CM

## 2020-04-26 DIAGNOSIS — M216X1 Other acquired deformities of right foot: Secondary | ICD-10-CM

## 2020-04-26 DIAGNOSIS — M79671 Pain in right foot: Secondary | ICD-10-CM

## 2020-04-26 DIAGNOSIS — R110 Nausea: Secondary | ICD-10-CM | POA: Diagnosis not present

## 2020-04-26 DIAGNOSIS — R7309 Other abnormal glucose: Secondary | ICD-10-CM | POA: Diagnosis not present

## 2020-04-26 DIAGNOSIS — I10 Essential (primary) hypertension: Secondary | ICD-10-CM | POA: Diagnosis not present

## 2020-04-26 DIAGNOSIS — M216X2 Other acquired deformities of left foot: Secondary | ICD-10-CM

## 2020-04-26 DIAGNOSIS — E538 Deficiency of other specified B group vitamins: Secondary | ICD-10-CM

## 2020-04-26 DIAGNOSIS — E559 Vitamin D deficiency, unspecified: Secondary | ICD-10-CM

## 2020-04-26 DIAGNOSIS — I700 Atherosclerosis of aorta: Secondary | ICD-10-CM | POA: Diagnosis not present

## 2020-04-26 DIAGNOSIS — M79672 Pain in left foot: Secondary | ICD-10-CM | POA: Insufficient documentation

## 2020-04-26 MED ORDER — ONDANSETRON 4 MG PO TBDP: 4.0000 mg | ORAL_TABLET | Freq: Three times a day (TID) | ORAL | 0 refills | Status: DC | PRN

## 2020-04-26 NOTE — Patient Instructions (Addendum)
Thank you for coming to the office today.  Referral in to Dr Elvina Mattes  Check labs today for sugar A1c, Vitamins  Stop Senna for now. Try the Miralax  For Constipation (less frequent bowel movement that can be hard dry or involve straining).  Recommend trying OTC Miralax 17g = 1 capful in large glass water once daily for now, try several days to see if working, goal is soft stool or BM 1-2 times daily, if too loose then reduce dose or try every other day. If not effective may need to increase it to 2 doses at once in AM or may do 1 in morning and 1 in afternoon/evening  - This medicine is very safe and can be used often without any problem and will not make you dehydrated. It is good for use on AS NEEDED BASIS or even MAINTENANCE therapy for longer term for several days to weeks at a time to help regulate bowel movements  Other more natural remedies or preventative treatment: - Increase hydration with water - Increase fiber in diet (high fiber foods = vegetables, leafy greens, oats/grains) - May take OTC Fiber supplement (metamucil powder or pill/gummy) - May try OTC Probiotic  Try Nasal Saline Spray first   Please schedule a Follow-up Appointment to: Return in about 3 months (around 07/27/2020) for 3 month follow-up HTN, PreDM A1c.  If you have any other questions or concerns, please feel free to call the office or send a message through [place]. You may also schedule an earlier appointment if necessary.  Additionally, you may be receiving a survey about your experience at our office within a few days to 1 week by e-mail or mail. We value your feedback.  [name], DO [facility]

## 2020-04-26 NOTE — Progress Notes (Signed)
Subjective:    Patient ID: Arthur Romero, male    DOB: 12-05-1940, 79 y.o.   MRN: [id number]  [place] is a 79 y.o. male presenting on 04/26/2020 for Hospitalization Follow-up (weakness-- Patient states he was constipated and still some was taking Senna but not helping )   HPI   ED FOLLOW-UP VISIT  Hospital/Location: [facility] Date of ED Visit: 04/19/20  Reason for Presenting to ED: Weakness  FOLLOW-UP  - ED provider note and record have been reviewed - Patient presents today about 7 days after recent ED visit. Brief summary of recent course, patient had symptoms of general weakness among other concerns R sided discomfort abdomen and lower abdomen pain and constipation, presented to ED on 6/25, testing in ED with labs chemistry UA, troponin, CT Renal study.  - Today reports overall has done well after discharge from ED. Symptoms of weakness have improved. But still has episodes affecting him. Still has some weak and constipation, last BM almost 1 week ago.  He has improved dietary fiber intake. More apples lately. Improving fluid. Last BM Saturday.  Elevated A1c Elevated blood sugar on labs. Requests further lab on this.  Aortic Atherosclerosis Identified on recent imaging  Foot pain bilateral toe joints Asking to see Dr Elvina Mattes  I have reviewed the discharge medication list, and have reconciled the current and discharge medications today.   Depression screen [facility] 2/9 04/26/2020 03/15/2020  Decreased Interest 0 0  Down, Depressed, Hopeless 0 0  PHQ - 2 Score 0 0    Social History   Tobacco Use  . Smoking status: Never Smoker  . Smokeless tobacco: Never Used  Vaping Use  . Vaping Use: Never used  Substance Use Topics  . Alcohol use: Never    Alcohol/week: 1.0 standard drink    Types: 1 Glasses of wine per week  . Drug use: Never    Review of Systems Per HPI unless specifically indicated above     Objective:    BP 130/71   Pulse 73   Temp (!) 97.1 F  (36.2 C) (Temporal)   Resp 16   Ht 6\' 3"  (1.905 m)   Wt 178 lb 9.6 oz (81 kg)   SpO2 99%   BMI 22.32 kg/m   Wt Readings from Last 3 Encounters:  04/26/20 178 lb 9.6 oz (81 kg)  04/19/20 188 lb 0.8 oz (85.3 kg)  03/15/20 188 lb (85.3 kg)    Physical Exam Vitals and nursing note reviewed.  Constitutional:      General: He is not in acute distress.    Appearance: He is well-developed. He is not diaphoretic.     Comments: Well-appearing, comfortable, cooperative  HENT:     Head: Normocephalic and atraumatic.  Eyes:     General:        Right eye: No discharge.        Left eye: No discharge.     Conjunctiva/sclera: Conjunctivae normal.  Neck:     Thyroid: No thyromegaly.  Cardiovascular:     Rate and Rhythm: Normal rate and regular rhythm.     Heart sounds: Normal heart sounds. No murmur heard.   Pulmonary:     Effort: Pulmonary effort is normal. No respiratory distress.     Breath sounds: Normal breath sounds. No wheezing or rales.  Musculoskeletal:        General: Normal range of motion.     Cervical back: Normal range of motion and neck supple.  Lymphadenopathy:  Cervical: No cervical adenopathy.  Skin:    General: Skin is warm and dry.     Findings: No erythema or rash.  Neurological:     Mental Status: He is alert and oriented to person, place, and time.  Psychiatric:        Behavior: Behavior normal.     Comments: Well groomed, good eye contact, normal speech and thoughts      CT Renal Stone StudyPerformed 04/19/2020 Final result  Study Result CLINICAL DATA: Evaluate for kidney stone.  EXAM: CT ABDOMEN AND PELVIS WITHOUT CONTRAST  TECHNIQUE: Multidetector CT imaging of the abdomen and pelvis was performed following the standard protocol without IV contrast.  COMPARISON: None.  FINDINGS: Lower chest: No acute abnormality.  Hepatobiliary: No focal liver abnormality is seen. No gallstones, gallbladder wall thickening, or biliary  dilatation.  Pancreas: There is a well-circumscribed, fluid attenuating cystic lesion 1.2 cm arising from the undersurface of the pancreatic neck, image 24/2. No main duct dilatation, or inflammation.  Spleen: Normal in size without focal abnormality. Calcified splenic granulomas noted.  Adrenals/Urinary Tract: Normal appearance of the adrenal glands. Cyst is identified arising from the anterior cortex of the right upper pole measuring 5.1 x 4.3 cm. Incompletely characterized without IV contrast. No kidney stones or hydronephrosis. Urinary bladder contains several small bladder wall diverticula, image 81/2 and image 82/2. No bladder calculi noted.  Stomach/Bowel: Small hiatal hernia. The small bowel loops are nondilated. The appendix is visualized and appears normal. No bowel wall thickening, distension or inflammation. Scattered distal colonic diverticula noted without acute inflammation.  Vascular/Lymphatic: Aortic atherosclerosis. No aneurysm. No abdominal or pelvic adenopathy.  Reproductive: Prostate is unremarkable.  Other: Small fat containing inguinal hernias. Previous ventral abdominal wall herniorrhaphy. Fat containing umbilical hernia is noted at the inferior margin of the hernia mesh.  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 No acute or suspicious bone lesions.  IMPRESSION: 1. No acute findings within the abdomen or pelvis. No urinary tract calculi identified. 2. Right kidney cyst. 3. 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 Follow-up imaging in 24 months with pancreas protocol MRI or CT without and with contrast material is recommended. This recommendation follows ACR consensus guidelines: Management of Incidental Pancreatic Cysts: A White Paper of the ACR Incidental Findings Committee. J Am Coll  Radiol 1610;[id number]:[id number]. 4. Aortic atherosclerosis.  Aortic Atherosclerosis (ICD10-I70.0).   Electronically Signed By: Kerby Moors M.D. On: 04/19/2020 11:00   Results for orders placed or performed during the hospital encounter of 04/19/20  CBC  Result Value Ref Range   WBC 10.1 4.0 - 10.5 K/uL   RBC 5.10 4.22 - 5.81 MIL/uL   Hemoglobin 16.1 13.0 - 17.0 g/dL   HCT 46.6 39 - 52 %   MCV 91.4 80.0 - 100.0 fL   MCH 31.6 26.0 - 34.0 pg   MCHC 34.5 30.0 - 36.0 g/dL   RDW 13.8 11.5 - 15.5 %   Platelets 273 150 - 400 K/uL   nRBC 0.0 0.0 - 0.2 %  Comprehensive metabolic panel  Result Value Ref Range   Sodium 138 135 - 145 mmol/L   Potassium 3.7 3.5 - 5.1 mmol/L   Chloride 100 98 - 111 mmol/L   CO2 25 22 - 32 mmol/L   Glucose, Bld 153 (H) 70 - 99 mg/dL   BUN 11 8 - 23 mg/dL   Creatinine, Ser 0.90 0.61 -  1.24 mg/dL   Calcium 9.4 8.9 - 10.3 mg/dL   Total Protein 7.8 6.5 - 8.1 g/dL   Albumin 4.5 3.5 - 5.0 g/dL   AST 23 15 - 41 U/L   ALT 26 0 - 44 U/L   Alkaline Phosphatase 88 38 - 126 U/L   Total Bilirubin 0.7 0.3 - 1.2 mg/dL   GFR calc non Af Amer >60 >60 mL/min   GFR calc Af Amer >60 >60 mL/min   Anion gap 13 5 - 15  Urinalysis, Complete w Microscopic  Result Value Ref Range   Color, Urine STRAW (A) YELLOW   APPearance CLEAR (A) CLEAR   Specific Gravity, Urine 1.003 (L) 1.005 - 1.030   pH 8.0 5.0 - 8.0   Glucose, UA NEGATIVE NEGATIVE mg/dL   Hgb urine dipstick MODERATE (A) NEGATIVE   Bilirubin Urine NEGATIVE NEGATIVE   Ketones, ur NEGATIVE NEGATIVE mg/dL   Protein, ur NEGATIVE NEGATIVE mg/dL   Nitrite NEGATIVE NEGATIVE   Leukocytes,Ua NEGATIVE NEGATIVE   RBC / HPF 0-5 0 - 5 RBC/hpf   WBC, UA NONE SEEN 0 - 5 WBC/hpf   Bacteria, UA NONE SEEN NONE SEEN   Squamous Epithelial / LPF NONE SEEN 0 - 5  Troponin I (High Sensitivity)  Result Value Ref Range   Troponin I (High Sensitivity) 4 <18 ng/L      Assessment & Plan:   Problem List Items Addressed This Visit     Foot pain, bilateral   Relevant Orders   Ambulatory referral to Podiatry   Essential hypertension   Relevant Orders   BASIC METABOLIC PANEL WITH GFR   Elevated hemoglobin A1c - Primary   Relevant Orders   Hemoglobin A1c   Aortic atherosclerosis (HCC)   Acquired bilateral pes cavus   Relevant Orders   Ambulatory referral to Podiatry    Other Visit Diagnoses    Vitamin B12 nutritional deficiency       Relevant Orders   Vitamin B12   Chronic frontal sinusitis       Vitamin D deficiency       Relevant Orders   VITAMIN D 25 Hydroxy (Vit-D Deficiency, Fractures)   Nausea       Relevant Medications   ondansetron (ZOFRAN ODT) 4 MG disintegrating tablet    Trial on Zofran ODT PRN some nausea, uncertain exact cause  Fatigue Prior B12 / Vit D deficiency Will re-check labs today  Elevated Glucose  No recent A1c Prior abnormal fasting glucose or non fasting labs Describes scenario of either hypoglycemia episodes, but has had lab with higher sugars Will check A1c  Aortic atherosclerosis CT Imaging identified this on 04/19/20. He is treated with Atorvastatin 40mg  daily.  Chronic sinusitis Can use nasal saline for now, has history allergic reaction to prednisone will defer this for now avoid flonase but they can try OTC if want to test it out temporarily, use caution  Constipation Episodic, chronic problem Failed senna Advised trial Miralax, details on AVS  #Podiatry Foot pain / toe joints / pes cavus / neuropathy Will refer to [facility] Dr Elvina Mattes   Orders Placed This Encounter  Procedures  . Vitamin B12  . Hemoglobin A1c  . VITAMIN D 25 Hydroxy (Vit-D Deficiency, Fractures)  . BASIC METABOLIC PANEL WITH GFR  . Ambulatory referral to Podiatry    Referral Priority:   Routine    Referral Type:   Consultation    Referral Reason:   Specialty Services Required    Referred to  Provider:   Albertine Patricia, DPM    Requested Specialty:   Podiatry    Number of Visits  Requested:   1      Meds ordered this encounter  Medications  . ondansetron (ZOFRAN ODT) 4 MG disintegrating tablet    Sig: Take 1 tablet (4 mg total) by mouth every 8 (eight) hours as needed for nausea or vomiting.    Dispense:  30 tablet    Refill:  0      Follow up plan: Return in about 3 months (around 07/27/2020) for 3 month follow-up HTN, PreDM A1c.  [name], DO [facility] 04/26/2020, 9:47 AM

## 2020-04-27 LAB — HEMOGLOBIN A1C
Hgb A1c MFr Bld: 6 % of total Hgb — ABNORMAL HIGH (ref <5.7)
Mean Plasma Glucose: 126 (calc)
eAG (mmol/L): 7 (calc)

## 2020-04-27 LAB — VITAMIN B12: Vitamin B-12: 469 pg/mL (ref 200–1100)

## 2020-04-27 LAB — BASIC METABOLIC PANEL WITH GFR
BUN: 13 mg/dL (ref 7–25)
CO2: 35 mmol/L — ABNORMAL HIGH (ref 20–32)
Calcium: 9.9 mg/dL (ref 8.6–10.3)
Chloride: 100 mmol/L (ref 98–110)
Creat: 0.84 mg/dL (ref 0.70–1.18)
GFR, Est African American: 99 mL/min/{1.73_m2} (ref >=60)
GFR, Est Non African American: 85 mL/min/{1.73_m2} (ref >=60)
Glucose, Bld: 101 mg/dL (ref 65–139)
Potassium: 4.1 mmol/L (ref 3.5–5.3)
Sodium: 141 mmol/L (ref 135–146)

## 2020-04-27 LAB — VITAMIN D 25 HYDROXY (VIT D DEFICIENCY, FRACTURES): Vit D, 25-Hydroxy: 32 ng/mL (ref 30–100)

## 2020-05-10 ENCOUNTER — Telehealth: Payer: Self-pay | Admitting: Family Medicine

## 2020-05-10 DIAGNOSIS — M1990 Unspecified osteoarthritis, unspecified site: Secondary | ICD-10-CM | POA: Diagnosis not present

## 2020-05-10 DIAGNOSIS — R7303 Prediabetes: Secondary | ICD-10-CM | POA: Diagnosis not present

## 2020-05-10 DIAGNOSIS — K5900 Constipation, unspecified: Secondary | ICD-10-CM | POA: Diagnosis not present

## 2020-05-10 DIAGNOSIS — R110 Nausea: Secondary | ICD-10-CM | POA: Diagnosis not present

## 2020-05-10 DIAGNOSIS — I10 Essential (primary) hypertension: Secondary | ICD-10-CM | POA: Diagnosis not present

## 2020-05-10 DIAGNOSIS — E039 Hypothyroidism, unspecified: Secondary | ICD-10-CM | POA: Diagnosis not present

## 2020-05-10 DIAGNOSIS — R1013 Epigastric pain: Secondary | ICD-10-CM | POA: Diagnosis not present

## 2020-05-10 DIAGNOSIS — R1084 Generalized abdominal pain: Secondary | ICD-10-CM | POA: Diagnosis not present

## 2020-05-10 DIAGNOSIS — F329 Major depressive disorder, single episode, unspecified: Secondary | ICD-10-CM | POA: Diagnosis not present

## 2020-05-10 DIAGNOSIS — K219 Gastro-esophageal reflux disease without esophagitis: Secondary | ICD-10-CM | POA: Diagnosis not present

## 2020-05-10 NOTE — Telephone Encounter (Signed)
Spoke with Mr Ragas. He does indicate he has not had a successful BM in 7 days, feels like a "stool blockage" he describes lower abdominal discomfort, has tried Miralax 1-2 capfuls only, not higher dose.  He said he prefers not to wait until next week and not interested in taking even more Miralax right now.  He states he was planning to go to [facility] ED today. I agreed and said that likely they would take imaging and further evaluation and treatment. He describes several symptoms that are affecting him now.  Cancelled KUB X-ray at our office now.  [name], [facility] 05/10/2020, 4:29 PM

## 2020-05-10 NOTE — Telephone Encounter (Signed)
Pt was advised to call if he wasn't getting any better/ Pt stated he was given laxatives but they havent done anything and Pt is still having problem with his bowels/ please advise

## 2020-05-10 NOTE — Telephone Encounter (Signed)
I have not seen him for detailed visit on constipation.  We contacted him 04/26/20 with lab results, and he reported constipation was not improved. He was given detailed instructions on dosing for Miralax powder for constipation.  Now it sounds like it is still not successful.  He has history of constipation for past 2-3+ months based on chart history.  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   Please let him know the following:  1. I would recommend an abdominal X-ray KUB in our office next week and we can discuss results on phone telephone virtual visit or in person if preferred. We can discuss other medications for constipation at that time.  2. Or if he prefers he can return to [place]  [place], [place] [id number]  [phone]   Or we can place referral to [facility] locally.  If he needs more immediate relief he should try OTC suppository or enema as needed.  [name], [facility] 05/10/2020, 3:15 PM

## 2020-05-16 ENCOUNTER — Telehealth (INDEPENDENT_AMBULATORY_CARE_PROVIDER_SITE_OTHER): Payer: Medicare PPO | Admitting: Family Medicine

## 2020-05-16 ENCOUNTER — Encounter: Payer: Self-pay | Admitting: Family Medicine

## 2020-05-16 ENCOUNTER — Other Ambulatory Visit: Payer: Self-pay

## 2020-05-16 ENCOUNTER — Ambulatory Visit: Payer: Self-pay

## 2020-05-16 DIAGNOSIS — N3001 Acute cystitis with hematuria: Secondary | ICD-10-CM

## 2020-05-16 MED ORDER — CEPHALEXIN 500 MG PO CAPS: 500.0000 mg | ORAL_CAPSULE | Freq: Three times a day (TID) | ORAL | 0 refills | Status: DC

## 2020-05-16 NOTE — Progress Notes (Addendum)
Virtual Visit via Telephone The purpose of this virtual visit is to provide medical care while limiting exposure to the novel coronavirus (COVID19) for both patient and office staff.  Consent was obtained for phone visit:  Yes.   Answered questions that patient had about telehealth interaction:  Yes.   I discussed the limitations, risks, security and privacy concerns of performing an evaluation and management service by telephone. I also discussed with the patient that there may be a patient responsible charge related to this service. The patient expressed understanding and agreed to proceed.  Patient Location: Home Provider Location: [facility])  ---------------------------------------------------------------------- Chief Complaint  Patient presents with  . Fatigue    hot, sweat, nausea onset yesterday --97.7 F today    S: Reviewed CMA documentation. I have called patient and gathered additional HPI as follows:  UTI / Weakness / Suprapubic pain / Constipation Recent course in past few months had sinusitis issues that were treated. Since then he went to ED 6/25 for weakness, ultimately work up was negative, and he was followed after hospital. He did have worsening constipation, has tried variety of therapy including miralax regimen we gave him, then he did contact us back on 05/10/20 and was advised to have imaging X-ray and further eval, he instead went to [facility] ED, had CT Abdomen, see results below, had some blood in urine and elevated WBC, he now endorses some discomfort with urinating dysuria and some improved constipation now Improved some with remedy apple juice and prune juice heated up to help have BM. Chart review showed last UTI 2019 treated with keflex Recent antibiotics in past 2-3 months Augmentin, Levaquin for sinuses that have resolved Admits occasional nausea, improved on zofran  Denies any known or suspected exposure to person with or possibly  with COVID19.  Denies any fevers, chills, body ache, cough, shortness of breath, sinus pain or pressure, headache, abdominal pain, diarrhea  Past Medical History:  Diagnosis Date  . Arthritis   . GERD (gastroesophageal reflux disease)   . Hypertension   . Rheumatoid arthritis ([place])   . Thyroid disease    Social History   Tobacco Use  . Smoking status: Never Smoker  . Smokeless tobacco: Never Used  Vaping Use  . Vaping Use: Never used  Substance Use Topics  . Alcohol use: Never    Alcohol/week: 1.0 standard drink    Types: 1 Glasses of wine per week  . Drug use: Never    Current Outpatient Medications:  .  acetaminophen (TYLENOL) 500 MG tablet, Take by mouth., Disp: , Rfl:  .  allopurinol (ZYLOPRIM) 300 MG tablet, Take 300 mg by mouth daily., Disp: , Rfl:  .  amLODipine (NORVASC) 10 MG tablet, Take 1 tablet (10 mg total) by mouth daily., Disp: 90 tablet, Rfl: 1 .  atorvastatin (LIPITOR) 40 MG tablet, Take 1 tablet (40 mg total) by mouth daily., Disp: 90 tablet, Rfl: 1 .  gabapentin (NEURONTIN) 100 MG capsule, Start 1 capsule daily at bed time, increase by 1 cap every 2-3 days as tolerated up to take 3 at once in evening., Disp: 270 capsule, Rfl: 1 .  levothyroxine (SYNTHROID) 88 MCG tablet, Take 1 tablet (88 mcg total) by mouth daily., Disp: 90 tablet, Rfl: 3 .  omeprazole (PRILOSEC) 20 MG capsule, Take 1 capsule (20 mg total) by mouth daily., Disp: 90 capsule, Rfl: 1 .  ondansetron (ZOFRAN ODT) 4 MG disintegrating tablet, Take 1 tablet (4 mg total) by mouth every  8 (eight) hours as needed for nausea or vomiting., Disp: 30 tablet, Rfl: 0 .  cephALEXin (KEFLEX) 500 MG capsule, Take 1 capsule (500 mg total) by mouth 3 (three) times daily. For 7 days, Disp: 21 capsule, Rfl: 0  Depression screen [facility] 2/9 04/26/2020 03/15/2020  Decreased Interest 0 0  Down, Depressed, Hopeless 0 0  PHQ - 2 Score 0 0    No flowsheet data  found.  -------------------------------------------------------------------------- O: No physical exam performed due to remote telephone encounter.  Lab results reviewed.   Interface, Rad Results In - 05/11/2020 8:35 AM EDT  Formatting of this note might be different from the original. EXAM: CT ABDOMEN PELVIS W CONTRAST DATE: 05/10/2020 9:06 PM ACCESSION: [id number] UN DICTATED: 05/10/2020 9:10 PM INTERPRETATION LOCATION: [place]: Diffuse abdominal pain, last BM 5 days ago, evaluate for SBO ; Bowel obstruction suspected   COMPARISON: MRI abdomen dated 08/21/2019  TECHNIQUE: A spiral CT scan of the abdomen and pelvis was obtained with IV contrast from the lung bases through the pubic symphysis. Images were reconstructed in the axial plane. Coronal and sagittal reformatted images were also provided for further evaluation.  FINDINGS:   LINES AND TUBES: None.  LOWER THORAX: Normal.  HEPATOBILIARY: No focal hepatic lesions. The gallbladder is present and otherwise unremarkable. No biliary dilatation.  SPLEEN: Multiple splenic calcifications. Small splenule. PANCREAS: Fatty atrophy of the pancreas. Multiple cystic lesions within the head/neck of the pancreas measuring up to 1 cm, unchanged from prior, consistent with sidebranch IPMNs. No wall thickening or enhancing nodular components.  ADRENALS: Unremarkable. KIDNEYS/URETERS: 4.9 cm right renal cyst.  BLADDER: Numerous bladder diverticula. PELVIC/REPRODUCTIVE ORGANS: Unremarkable.  GI TRACT: No dilated or thick walled loops of bowel. Diverticulosis without evidence of diverticulitis. Appendix is unremarkable. Small hiatal hernia. No evidence of bowel obstruction.  PERITONEUM/RETROPERITONEUM AND MESENTERY: No free air or fluid. LYMPH NODES: No enlarged lymph nodes.  VESSELS: The aorta is normal in caliber. Calcified and noncalcified atherosclerotic disease of the aorta and its branches. The portal venous  system is patent. The hepatic veins and IVC are unremarkable.  BONES AND SOFT TISSUES: Multilevel degenerative changes of the spine. Small fat-containing umbilical hernia and bilateral inguinal hernias.  IMPRESSION: No evidence of bowel obstruction. No acute intra-abdominal findings identified to explain patient's symptoms.  Urinalysis with Culture Reflex (05/10/2020 8:21 PM EDT) Urinalysis with Culture Reflex (05/10/2020 8:21 PM EDT)  Component Value Ref Range Performed At Pathologist Signature  Color, UA Yellow  [facility]   Clarity, UA Clear  [facility]   Specific Gravity, UA <=1.005 1.005 - 1.040 [facility]   pH, UA 6.0 5.0 - 9.0 [facility]   Leukocyte Esterase, UA Negative Negative [facility]   Nitrite, UA Negative Negative [facility]   Protein, UA Negative Negative [facility]   Glucose, UA Negative Negative [facility]   Ketones, UA Negative Negative [facility]   Urobilinogen, UA 0.2 mg/dL 0.2 - 2.0 mg/dL [facility]   Bilirubin, UA Negative Negative [facility]   Blood, UA Trace (A) Negative [facility]   RBC, UA 10 (H) <3 /HPF [facility]   WBC, UA 1 <2 /HPF [facility]   Squam Epithel, UA 0 0 - 5 /HPF [facility]   Bacteria, UA None Seen None Seen /HPF [facility]    Urinalysis with Culture Reflex (05/10/2020 8:21 PM EDT)  Specimen  Urine -  Clean Catch     Labs Reviewed  URINALYSIS WITH CULTURE REFLEX - Abnormal; Notable for the following components:  Result Value  Blood, UA Trace (*)  RBC, UA 10 (*)  All other components within normal limits  CBC W/ AUTO DIFF - Abnormal; Notable for the following components:  WBC 12.2 (*)  MPV 6.7 (*)  Absolute Neutrophils 9.2 (*)  All  other components within normal limits  LIPASE - Normal  HEPATIC FUNCTION PANEL - Normal  MAGNESIUM - Normal  HIGH SENSITIVITY TROPONIN I - SINGLE - Normal  CBC W/ DIFFERENTIAL  Narrative:  The following orders were created for panel order CBC w/ Differential.   Recent Results (from the past 2160 hour(s))  BASIC METABOLIC PANEL WITH GFR     Status: Abnormal   Collection Time: 03/18/20  7:55 AM  Result Value Ref Range   Glucose, Bld 106 (H) 65 - 99 mg/dL    Comment: .            Fasting reference interval . For someone without known diabetes, a glucose value between 100 and 125 mg/dL is consistent with prediabetes and should be confirmed with a follow-up test. .    BUN 15 7 - 25 mg/dL   Creat 1.00 0.70 - 1.18 mg/dL    Comment: For patients >24 years of age, the reference limit for Creatinine is approximately 13% higher for people identified as African-American. .    GFR, Est Non African American 73 > OR = 60 mL/min/1.44m2   GFR, Est African American 84 > OR = 60 mL/min/1.43m2   BUN/Creatinine Ratio NOT APPLICABLE 6 - 22 (calc)   Sodium 142 135 - 146 mmol/L   Potassium 4.7 3.5 - 5.3 mmol/L   Chloride 102 98 - 110 mmol/L   CO2 28 20 - 32 mmol/L   Calcium 9.8 8.6 - 10.3 mg/dL  CBC with Differential/Platelet     Status: None   Collection Time: 03/18/20  7:55 AM  Result Value Ref Range   WBC 10.0 3.8 - 10.8 Thousand/uL   RBC 4.98 4.20 - 5.80 Million/uL   Hemoglobin 15.7 13.2 - 17.1 g/dL   HCT 47.7 38 - 50 %   MCV 95.8 80.0 - 100.0 fL   MCH 31.5 27.0 - 33.0 pg   MCHC 32.9 32.0 - 36.0 g/dL   RDW 13.6 11.0 - 15.0 %   Platelets 287 140 - 400 Thousand/uL   MPV 9.2 7.5 - 12.5 fL   Neutro Abs 6,910 1,500 - 7,800 cells/uL   Lymphs Abs 2,020 850 - 3,900 cells/uL   Absolute Monocytes 790 200 - 950 cells/uL   Eosinophils Absolute 230 15 - 500 cells/uL   Basophils Absolute 50 0 - 200 cells/uL   Neutrophils Relative % 69.1 %   Total Lymphocyte 20.2 %   Monocytes Relative 7.9 %    Eosinophils Relative 2.3 %   Basophils Relative 0.5 %  T4, free     Status: None   Collection Time: 03/18/20  7:55 AM  Result Value Ref Range   Free T4 1.4 0.8 - 1.8 ng/dL  TSH     Status: None   Collection Time: 03/18/20  7:55 AM  Result Value Ref Range   TSH 4.26 0.40 - 4.50 mIU/L  CBC     Status: None   Collection Time: 04/19/20 10:06 AM  Result Value Ref Range   WBC 10.1 4.0 - 10.5 K/uL   RBC 5.10 4.22 - 5.81 MIL/uL  Hemoglobin 16.1 13.0 - 17.0 g/dL   HCT 46.6 39 - 52 %   MCV 91.4 80.0 - 100.0 fL   MCH 31.6 26.0 - 34.0 pg   MCHC 34.5 30.0 - 36.0 g/dL   RDW 13.8 11.5 - 15.5 %   Platelets 273 150 - 400 K/uL   nRBC 0.0 0.0 - 0.2 %    Comment: Performed at [facility], [address] [place], [place] [address]  Comprehensive metabolic panel     Status: Abnormal   Collection Time: 04/19/20 10:06 AM  Result Value Ref Range   Sodium 138 135 - 145 mmol/L   Potassium 3.7 3.5 - 5.1 mmol/L   Chloride 100 98 - 111 mmol/L   CO2 25 22 - 32 mmol/L   Glucose, Bld 153 (H) 70 - 99 mg/dL    Comment: Glucose reference range applies only to samples taken after fasting for at least 8 hours.   BUN 11 8 - 23 mg/dL   Creatinine, Ser 0.90 0.61 - 1.24 mg/dL   Calcium 9.4 8.9 - 10.3 mg/dL   Total Protein 7.8 6.5 - 8.1 g/dL   Albumin 4.5 3.5 - 5.0 g/dL   AST 23 15 - 41 U/L   ALT 26 0 - 44 U/L   Alkaline Phosphatase 88 38 - 126 U/L   Total Bilirubin 0.7 0.3 - 1.2 mg/dL   GFR calc non Af Amer >60 >60 mL/min   GFR calc Af Amer >60 >60 mL/min   Anion gap 13 5 - 15    Comment: Performed at [facility], [place], [place] [address]  Troponin I (High Sensitivity)     Status: None   Collection Time: 04/19/20 10:06 AM  Result Value Ref Range   Troponin I (High Sensitivity) 4 <18 ng/L    Comment: (NOTE) Elevated high sensitivity troponin I (hsTnI) values and significant  changes across serial measurements may suggest ACS but many other  chronic and acute  conditions are known to elevate hsTnI results.  Refer to the "Links" section for chest pain algorithms and additional  guidance. Performed at [facility], [address] [place], [place] [address]   Urinalysis, Complete w Microscopic     Status: Abnormal   Collection Time: 04/19/20 10:06 AM  Result Value Ref Range   Color, Urine STRAW (A) YELLOW   APPearance CLEAR (A) CLEAR   Specific Gravity, Urine 1.003 (L) 1.005 - 1.030   pH 8.0 5.0 - 8.0   Glucose, UA NEGATIVE NEGATIVE mg/dL   Hgb urine dipstick MODERATE (A) NEGATIVE   Bilirubin Urine NEGATIVE NEGATIVE   Ketones, ur NEGATIVE NEGATIVE mg/dL   Protein, ur NEGATIVE NEGATIVE mg/dL   Nitrite NEGATIVE NEGATIVE   Leukocytes,Ua NEGATIVE NEGATIVE   RBC / HPF 0-5 0 - 5 RBC/hpf   WBC, UA NONE SEEN 0 - 5 WBC/hpf   Bacteria, UA NONE SEEN NONE SEEN   Squamous Epithelial / LPF NONE SEEN 0 - 5    Comment: Performed at [facility], [address], [place],  [address]  Vitamin B12     Status: None   Collection Time: 04/26/20 10:06 AM  Result Value Ref Range   Vitamin B-12 469 200 - 1,100 pg/mL  Hemoglobin A1c     Status: Abnormal   Collection Time: 04/26/20 10:06 AM  Result Value Ref Range   Hgb A1c MFr Bld 6.0 (H) <5.7 % of total Hgb    Comment: For someone without  known diabetes, a hemoglobin  A1c value between 5.7% and 6.4% is consistent with prediabetes and should be confirmed with a  follow-up test. . For someone with known diabetes, a value <7% indicates that their diabetes is well controlled. A1c targets should be individualized based on duration of diabetes, age, comorbid conditions, and other considerations. . This assay result is consistent with an increased risk of diabetes. . Currently, no consensus exists regarding use of hemoglobin A1c for diagnosis of diabetes for children. .    Mean Plasma Glucose 126 (calc)   eAG (mmol/L) 7.0 (calc)  VITAMIN D 25 Hydroxy (Vit-D Deficiency,  Fractures)     Status: None   Collection Time: 04/26/20 10:06 AM  Result Value Ref Range   Vit D, 25-Hydroxy 32 30 - 100 ng/mL    Comment: Vitamin D Status         25-OH Vitamin D: . Deficiency:                    <20 ng/mL Insufficiency:             20 - 29 ng/mL Optimal:                 > or = 30 ng/mL . For 25-OH Vitamin D testing on patients on  D2-supplementation and patients for whom quantitation  of D2 and D3 fractions is required, the [employer](TM) 25-OH VIT D, (D2,D3), LC/MS/MS is recommended: order  code [phone] (patients >58yrs). See Note 1 . Note 1 . For additional information, please refer to  http://education.[facility].com/faq/FAQ199  (This link is being provided for informational/ educational purposes only.)   BASIC METABOLIC PANEL WITH GFR     Status: Abnormal   Collection Time: 04/26/20 10:06 AM  Result Value Ref Range   Glucose, Bld 101 65 - 139 mg/dL    Comment: .        Non-fasting reference interval .    BUN 13 7 - 25 mg/dL   Creat 0.84 0.70 - 1.18 mg/dL    Comment: For patients >39 years of age, the reference limit for Creatinine is approximately 13% higher for people identified as African-American. .    GFR, Est Non African American 85 > OR = 60 mL/min/1.57m2   GFR, Est African American 99 > OR = 60 mL/min/1.7m2   BUN/Creatinine Ratio NOT APPLICABLE 6 - 22 (calc)   Sodium 141 135 - 146 mmol/L   Potassium 4.1 3.5 - 5.3 mmol/L   Chloride 100 98 - 110 mmol/L   CO2 35 (H) 20 - 32 mmol/L   Calcium 9.9 8.6 - 10.3 mg/dL    -------------------------------------------------------------------------- A&P:  Problem List Items Addressed This Visit    None    Visit Diagnoses    Acute cystitis with hematuria    -  Primary   Relevant Medications   cephALEXin (KEFLEX) 500 MG capsule     Clinically consistent with UTI based on history, suspect may be causing his generalized symptoms fatigue and weak, has positive urinary symptoms reported  now. - 7/16 [facility] ED already had CT abdomen, reassuring, reviewed results work up EKG labs, some elevated WBC  Recent antibiotics Augmentin, Levaquin 2+ months ago for sinus, has resolved  Prior UTI 2019 on keflex with resolution  Plan: 1. Start Keflex 500mg  TID x 7 days 2. Improve PO hydration  RTC if no improvement 1-2 weeks, red flags given to return sooner here or at hospital ED if sudden new change.  Meds ordered this encounter  Medications  . cephALEXin (KEFLEX) 500 MG capsule    Sig: Take 1 capsule (500 mg total) by mouth 3 (three) times daily. For 7 days    Dispense:  21 capsule    Refill:  0     Patient verbalizes understanding with the above medical recommendations including the limitation of remote medical advice.  Specific follow-up and call-back criteria were given for patient to follow-up or seek medical care more urgently if needed.   - Time spent in direct consultation with patient on phone: 11 minutes   [name], [facility] 05/16/2020, 10:05 AM

## 2020-05-16 NOTE — Telephone Encounter (Signed)
Pt. Reports he "has felt bad for awhile, but it has gotten worse.I'm weak today, sweaty and hot. I feel like I have an infection." PEC agent made a televisit for this morning. Pt. Reports he has transportation issues sometimes. States he has decreased appetite, "but I'm drinking water." Instructed to call EMS if symptoms worsen. Verbalizes understanding. Answer Assessment - Initial Assessment Questions 1. DESCRIPTION: "Describe how you are feeling."     Hot, sweaty, weak 2. SEVERITY: "How bad is it?"  "Can you stand and walk?"   - MILD - Feels weak or tired, but does not interfere with work, school or normal activities   - [place] to stand and walk; weakness interferes with work, school, or normal activities   - SEVERE - Unable to stand or walk     Moderate 3. ONSET:  "When did the weakness begin?"     1 week ago 4. CAUSE: "What do you think is causing the weakness?"     Unsure 5. MEDICINES: "Have you recently started a new medicine or had a change in the amount of a medicine?"     No 6. OTHER SYMPTOMS: "Do you have any other symptoms?" (e.g., chest pain, fever, cough, SOB, vomiting, diarrhea, bleeding, other areas of pain)     Hot, sweaty 7. PREGNANCY: "Is there any chance you are pregnant?" "When was your last menstrual period?"     n/a  Protocols used: WEAKNESS (GENERALIZED) AND FATIGUE-A-AH

## 2020-05-18 ENCOUNTER — Encounter: Payer: Self-pay | Admitting: Intensive Care

## 2020-05-18 ENCOUNTER — Inpatient Hospital Stay
Admission: EM | Admit: 2020-05-18 | Discharge: 2020-05-24 | DRG: 644 | Disposition: A | Discharge status: Skilled Nursing Facility | Payer: Medicare PPO | Attending: Internal Medicine | Admitting: Internal Medicine

## 2020-05-18 ENCOUNTER — Emergency Department: Payer: Medicare PPO

## 2020-05-18 ENCOUNTER — Other Ambulatory Visit: Payer: Self-pay

## 2020-05-18 DIAGNOSIS — Z6824 Body mass index (BMI) 24.0-24.9, adult: Secondary | ICD-10-CM | POA: Diagnosis not present

## 2020-05-18 DIAGNOSIS — E222 Syndrome of inappropriate secretion of antidiuretic hormone: Secondary | ICD-10-CM | POA: Diagnosis present

## 2020-05-18 DIAGNOSIS — Z823 Family history of stroke: Secondary | ICD-10-CM

## 2020-05-18 DIAGNOSIS — K449 Diaphragmatic hernia without obstruction or gangrene: Secondary | ICD-10-CM | POA: Diagnosis not present

## 2020-05-18 DIAGNOSIS — Z20822 Contact with and (suspected) exposure to covid-19: Secondary | ICD-10-CM | POA: Diagnosis present

## 2020-05-18 DIAGNOSIS — G629 Polyneuropathy, unspecified: Secondary | ICD-10-CM | POA: Diagnosis present

## 2020-05-18 DIAGNOSIS — K8689 Other specified diseases of pancreas: Secondary | ICD-10-CM | POA: Diagnosis not present

## 2020-05-18 DIAGNOSIS — E860 Dehydration: Secondary | ICD-10-CM | POA: Diagnosis present

## 2020-05-18 DIAGNOSIS — E785 Hyperlipidemia, unspecified: Secondary | ICD-10-CM | POA: Diagnosis present

## 2020-05-18 DIAGNOSIS — R339 Retention of urine, unspecified: Secondary | ICD-10-CM | POA: Diagnosis present

## 2020-05-18 DIAGNOSIS — K219 Gastro-esophageal reflux disease without esophagitis: Secondary | ICD-10-CM | POA: Diagnosis present

## 2020-05-18 DIAGNOSIS — R1310 Dysphagia, unspecified: Secondary | ICD-10-CM | POA: Diagnosis present

## 2020-05-18 DIAGNOSIS — Z79890 Hormone replacement therapy (postmenopausal): Secondary | ICD-10-CM

## 2020-05-18 DIAGNOSIS — Z79899 Other long term (current) drug therapy: Secondary | ICD-10-CM

## 2020-05-18 DIAGNOSIS — M069 Rheumatoid arthritis, unspecified: Secondary | ICD-10-CM | POA: Diagnosis present

## 2020-05-18 DIAGNOSIS — K5900 Constipation, unspecified: Secondary | ICD-10-CM | POA: Diagnosis present

## 2020-05-18 DIAGNOSIS — R7303 Prediabetes: Secondary | ICD-10-CM | POA: Diagnosis present

## 2020-05-18 DIAGNOSIS — I4821 Permanent atrial fibrillation: Secondary | ICD-10-CM | POA: Diagnosis present

## 2020-05-18 DIAGNOSIS — M109 Gout, unspecified: Secondary | ICD-10-CM | POA: Diagnosis present

## 2020-05-18 DIAGNOSIS — J90 Pleural effusion, not elsewhere classified: Secondary | ICD-10-CM | POA: Diagnosis not present

## 2020-05-18 DIAGNOSIS — D649 Anemia, unspecified: Secondary | ICD-10-CM | POA: Diagnosis present

## 2020-05-18 DIAGNOSIS — R338 Other retention of urine: Secondary | ICD-10-CM

## 2020-05-18 DIAGNOSIS — E871 Hypo-osmolality and hyponatremia: Secondary | ICD-10-CM

## 2020-05-18 DIAGNOSIS — R531 Weakness: Secondary | ICD-10-CM

## 2020-05-18 DIAGNOSIS — E039 Hypothyroidism, unspecified: Secondary | ICD-10-CM | POA: Diagnosis present

## 2020-05-18 DIAGNOSIS — I480 Paroxysmal atrial fibrillation: Secondary | ICD-10-CM

## 2020-05-18 DIAGNOSIS — Z7982 Long term (current) use of aspirin: Secondary | ICD-10-CM

## 2020-05-18 DIAGNOSIS — M6281 Muscle weakness (generalized): Secondary | ICD-10-CM | POA: Diagnosis not present

## 2020-05-18 DIAGNOSIS — J9811 Atelectasis: Secondary | ICD-10-CM | POA: Diagnosis not present

## 2020-05-18 DIAGNOSIS — I361 Nonrheumatic tricuspid (valve) insufficiency: Secondary | ICD-10-CM | POA: Diagnosis not present

## 2020-05-18 DIAGNOSIS — R5381 Other malaise: Secondary | ICD-10-CM | POA: Diagnosis not present

## 2020-05-18 DIAGNOSIS — Z833 Family history of diabetes mellitus: Secondary | ICD-10-CM

## 2020-05-18 DIAGNOSIS — K859 Acute pancreatitis without necrosis or infection, unspecified: Secondary | ICD-10-CM | POA: Diagnosis not present

## 2020-05-18 DIAGNOSIS — R1084 Generalized abdominal pain: Secondary | ICD-10-CM | POA: Diagnosis not present

## 2020-05-18 DIAGNOSIS — I4819 Other persistent atrial fibrillation: Secondary | ICD-10-CM | POA: Diagnosis not present

## 2020-05-18 DIAGNOSIS — Z9181 History of falling: Secondary | ICD-10-CM | POA: Diagnosis not present

## 2020-05-18 DIAGNOSIS — R627 Adult failure to thrive: Secondary | ICD-10-CM | POA: Diagnosis present

## 2020-05-18 DIAGNOSIS — I4891 Unspecified atrial fibrillation: Secondary | ICD-10-CM | POA: Diagnosis not present

## 2020-05-18 DIAGNOSIS — E7849 Other hyperlipidemia: Secondary | ICD-10-CM | POA: Diagnosis not present

## 2020-05-18 DIAGNOSIS — I517 Cardiomegaly: Secondary | ICD-10-CM | POA: Diagnosis not present

## 2020-05-18 DIAGNOSIS — N179 Acute kidney failure, unspecified: Secondary | ICD-10-CM | POA: Diagnosis present

## 2020-05-18 DIAGNOSIS — R109 Unspecified abdominal pain: Secondary | ICD-10-CM | POA: Diagnosis not present

## 2020-05-18 DIAGNOSIS — I10 Essential (primary) hypertension: Secondary | ICD-10-CM | POA: Diagnosis present

## 2020-05-18 DIAGNOSIS — A419 Sepsis, unspecified organism: Secondary | ICD-10-CM

## 2020-05-18 DIAGNOSIS — Z8249 Family history of ischemic heart disease and other diseases of the circulatory system: Secondary | ICD-10-CM | POA: Diagnosis not present

## 2020-05-18 DIAGNOSIS — E440 Moderate protein-calorie malnutrition: Secondary | ICD-10-CM | POA: Diagnosis present

## 2020-05-18 DIAGNOSIS — N3289 Other specified disorders of bladder: Secondary | ICD-10-CM | POA: Diagnosis not present

## 2020-05-18 DIAGNOSIS — Z7401 Bed confinement status: Secondary | ICD-10-CM | POA: Diagnosis not present

## 2020-05-18 DIAGNOSIS — M2550 Pain in unspecified joint: Secondary | ICD-10-CM | POA: Diagnosis not present

## 2020-05-18 DIAGNOSIS — I700 Atherosclerosis of aorta: Secondary | ICD-10-CM | POA: Diagnosis not present

## 2020-05-18 DIAGNOSIS — I340 Nonrheumatic mitral (valve) insufficiency: Secondary | ICD-10-CM | POA: Diagnosis not present

## 2020-05-18 LAB — BASIC METABOLIC PANEL
Anion gap: 14 (ref 5–15)
BUN: 45 mg/dL — ABNORMAL HIGH (ref 8–23)
CO2: 19 mmol/L — ABNORMAL LOW (ref 22–32)
Calcium: 8.7 mg/dL — ABNORMAL LOW (ref 8.9–10.3)
Chloride: 83 mmol/L — ABNORMAL LOW (ref 98–111)
Creatinine, Ser: 2.6 mg/dL — ABNORMAL HIGH (ref 0.61–1.24)
GFR calc Af Amer: 27 mL/min — ABNORMAL LOW (ref >60)
GFR calc non Af Amer: 23 mL/min — ABNORMAL LOW (ref >60)
Glucose, Bld: 148 mg/dL — ABNORMAL HIGH (ref 70–99)
Potassium: 4.5 mmol/L (ref 3.5–5.1)
Sodium: 116 mmol/L — CL (ref 135–145)

## 2020-05-18 LAB — CBC
HCT: 35.7 % — ABNORMAL LOW (ref 39.0–52.0)
Hemoglobin: 13.2 g/dL (ref 13.0–17.0)
MCH: 32.1 pg (ref 26.0–34.0)
MCHC: 37 g/dL — ABNORMAL HIGH (ref 30.0–36.0)
MCV: 86.9 fL (ref 80.0–100.0)
Platelets: 281 10*3/uL (ref 150–400)
RBC: 4.11 MIL/uL — ABNORMAL LOW (ref 4.22–5.81)
RDW: 12.8 % (ref 11.5–15.5)
WBC: 19.8 10*3/uL — ABNORMAL HIGH (ref 4.0–10.5)
nRBC: 0 % (ref 0.0–0.2)

## 2020-05-18 LAB — LACTIC ACID, PLASMA: Lactic Acid, Venous: 2.1 mmol/L (ref 0.5–1.9)

## 2020-05-18 IMAGING — CT CT ABD-PELV W/O CM
2 of 4 series · 15 of 46 positions shown, 17 images · non-contrast
Comparison: [DATE]

CLINICAL DATA: Fever. Cough. Nausea x3 days. Constipation x1 week.

EXAM:
CT ABDOMEN AND PELVIS WITHOUT CONTRAST
TECHNIQUE: Multidetector CT imaging of the abdomen and pelvis was performed
following the standard protocol without IV contrast.

[Series 2: routine abd/pel wo · axial · 0.88mm/px · z∈[-550,-110]mm · 12 of 106 slices shown, 14 images]
[im 9/106  soft-tissue]
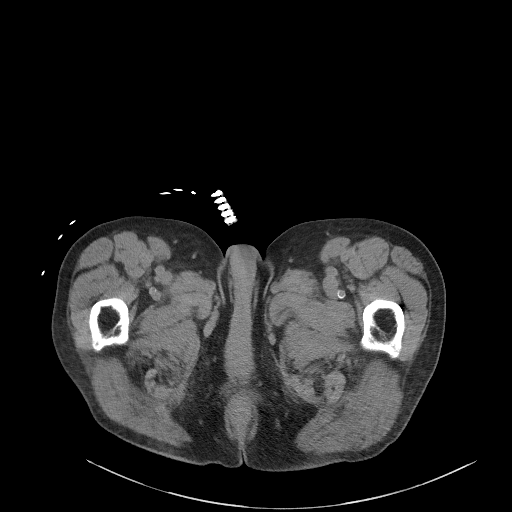
[im 9/106  bone]
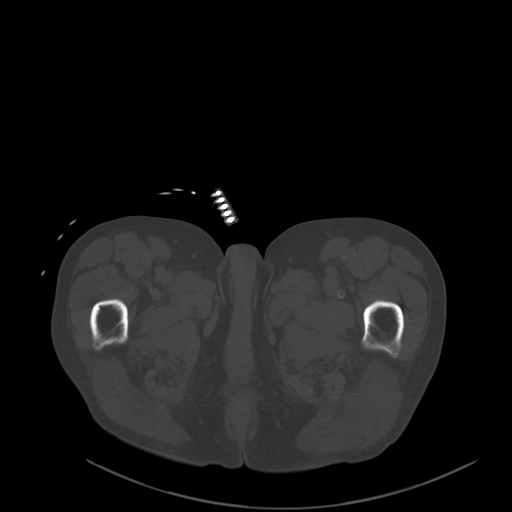
[im 17/106  soft-tissue]
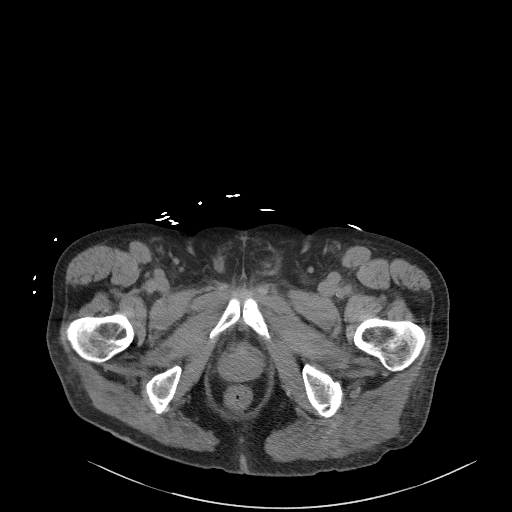
[im 25/106  soft-tissue]
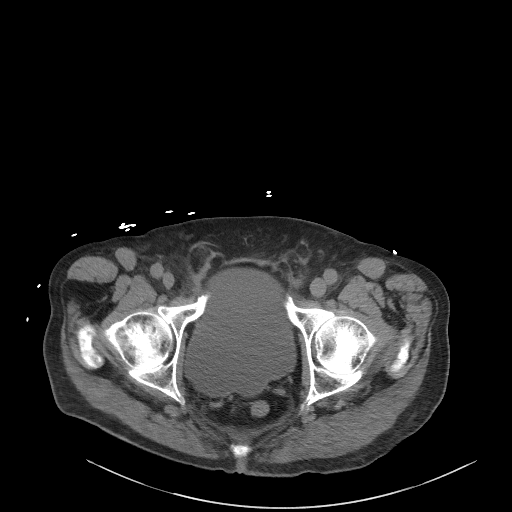
[im 33/106  soft-tissue]
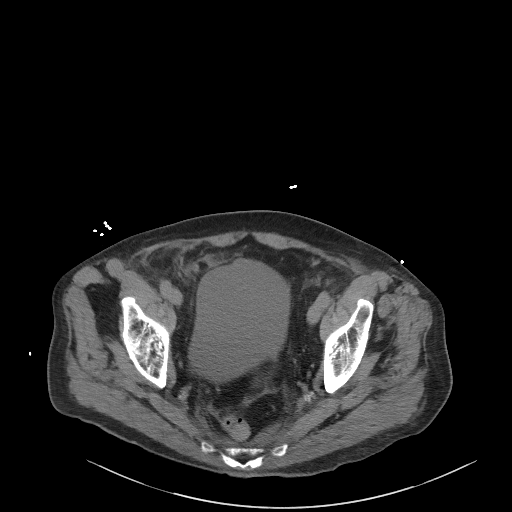
[im 41/106  soft-tissue]
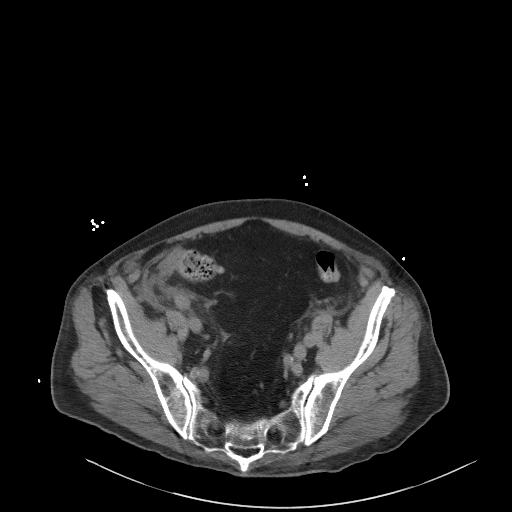
[im 49/106  soft-tissue]
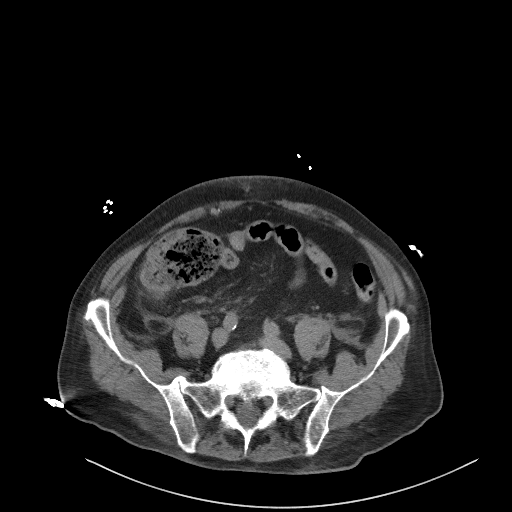
[im 57/106  soft-tissue]
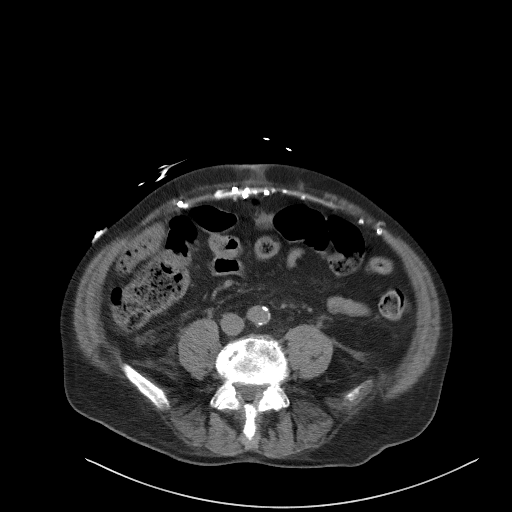
[im 65/106  soft-tissue]
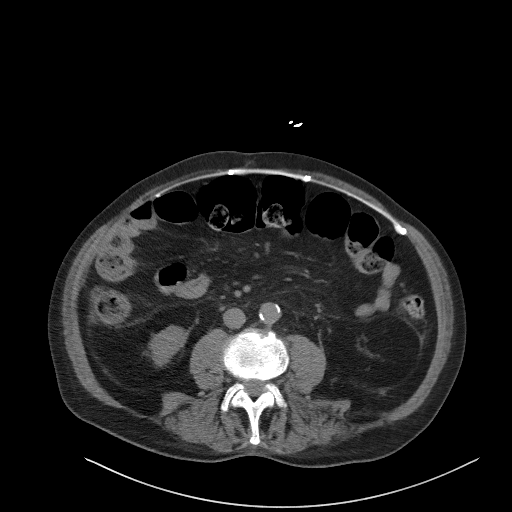
[im 73/106  soft-tissue]
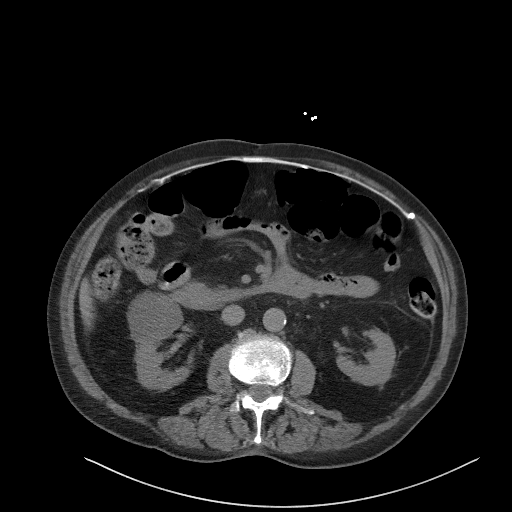
[im 73/106  bone]
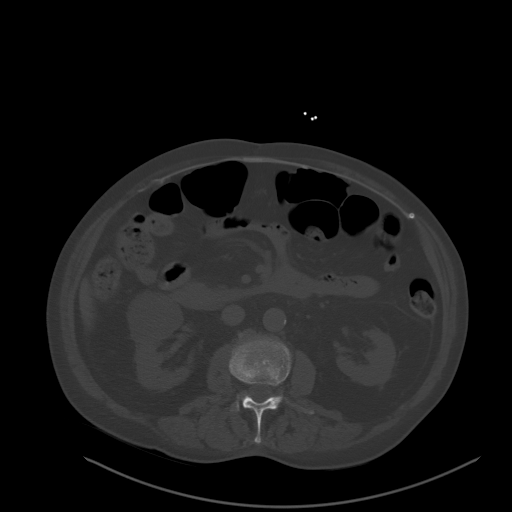
[im 81/106  soft-tissue]
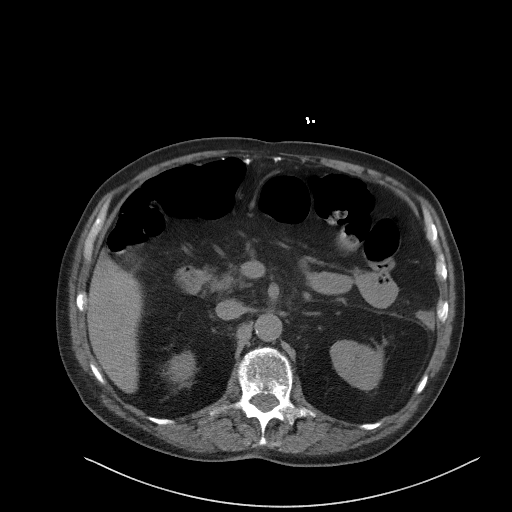
[im 89/106  soft-tissue]
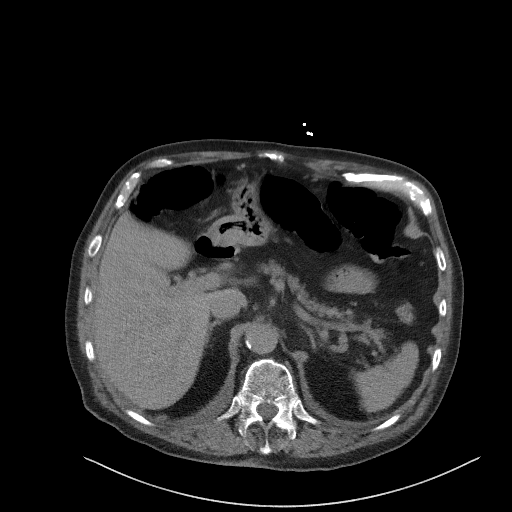
[im 97/106  soft-tissue]
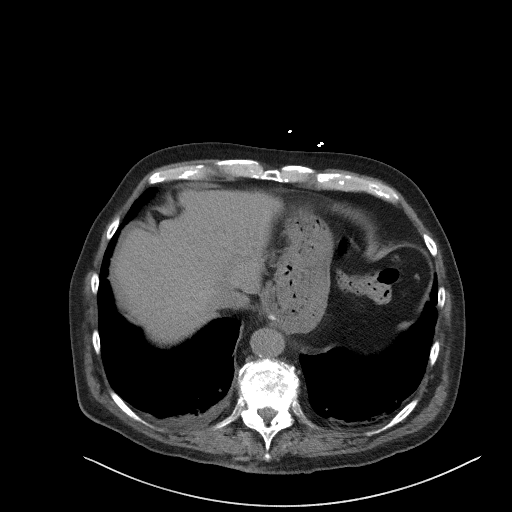

[Series 5: coronal st · coronal · 0.74mm/px · 3 of 112 slices shown]
[im 38/112  soft-tissue]
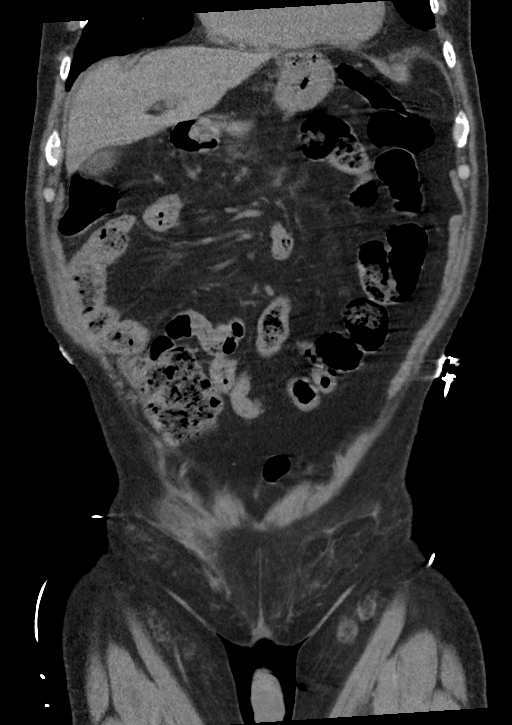
[im 50/112  soft-tissue]
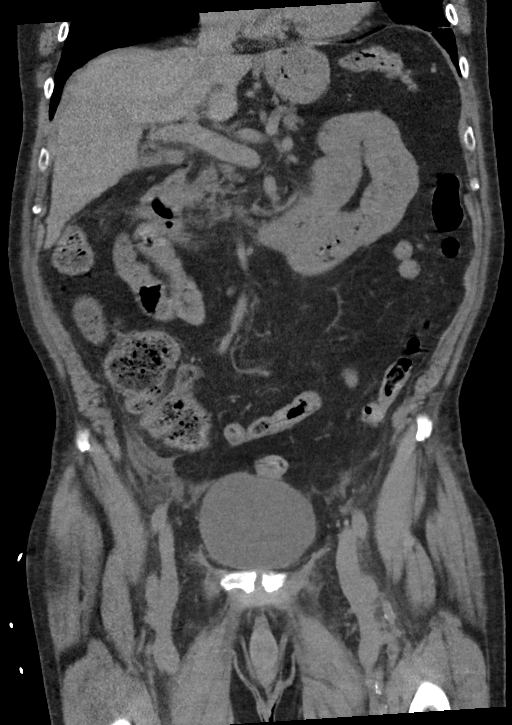
[im 62/112  soft-tissue]
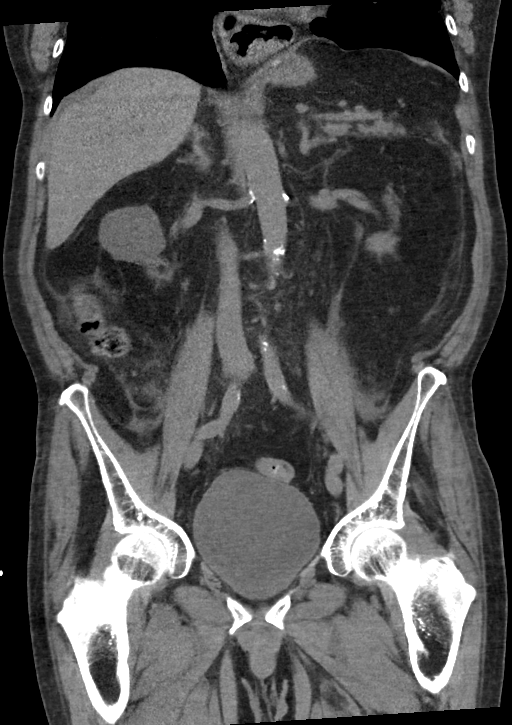

[15 of 46 positions shown; findings below may reference images not displayed]

FINDINGS: Lower chest: There are new trace bilateral pleural effusions, right
greater than left with adjacent bibasilar atelectasis.The heart is
enlarged.

Hepatobiliary: The liver is normal. Normal gallbladder.There is no
biliary ductal dilation.

Pancreas: Again noted is a small cystic lesion in the pancreatic
neck measuring approximately 1.2 cm. There is new mild hazy fat
stranding about the pancreas.

Spleen: Calcifications are noted in the patient's spleen.

Adrenals/Urinary Tract:

--Adrenal glands: Unremarkable.

--Right kidney/ureter: Again noted is a right renal cyst.

--Left kidney/ureter: No hydronephrosis or radiopaque kidney stones.

--Urinary bladder: The urinary bladder is significantly distended.
There is a left bladder wall diverticulum.

Stomach/Bowel:

--Stomach/Duodenum: There is a moderate-sized hiatal hernia.

--Small bowel: Unremarkable.

--Colon: There is an above average amount of stool in the colon.

--Appendix: Normal.

Vascular/Lymphatic: Atherosclerotic calcification is present within
the non-aneurysmal abdominal aorta, without hemodynamically
significant stenosis.

--No retroperitoneal lymphadenopathy.

--No mesenteric lymphadenopathy.

--No pelvic or inguinal lymphadenopathy.

Reproductive: The patient is status post prior prostatectomy.

Other: There is new free fluid in the patient's pericolic gutters
and in the presacral region. The patient is status post prior
ventral wall hernia repair with mesh. There is a small fat
containing umbilical hernia. There are bilateral fat containing
inguinal hernias.

Musculoskeletal. No acute displaced fractures.
IMPRESSION: 1. New mild hazy fat stranding about the pancreas, concerning for
acute pancreatitis. Correlation with lipase is recommended.
2. New trace bilateral pleural effusions, right greater than left,
with adjacent bibasilar atelectasis.
3. Cardiomegaly.
4. Moderate-sized hiatal hernia.
5. Distended urinary bladder.
6. New free fluid in the abdomen and pelvis of unknown clinical
significance.

Aortic Atherosclerosis ([6A]-[6A]).

## 2020-05-18 MED ORDER — SODIUM CHLORIDE 0.9 % IV BOLUS: 1000.0000 mL | Freq: Once | INTRAVENOUS | Status: DC

## 2020-05-18 MED ORDER — SODIUM CHLORIDE 0.9 % IV BOLUS
1000.0000 mL | Freq: Once | INTRAVENOUS | Status: AC
  Administered 2020-05-18: 1000 mL via INTRAVENOUS

## 2020-05-18 NOTE — ED Notes (Addendum)
Pt c/o of abdominal distension and constipation with flatulence  Pt reports burning when urinating

## 2020-05-18 NOTE — ED Notes (Signed)
Lab called with Na+ of 116. Charge RN called for a bed

## 2020-05-18 NOTE — ED Notes (Signed)
CT called for pt ready

## 2020-05-18 NOTE — ED Provider Notes (Addendum)
[facility] Emergency Department Provider Note  ____________________________________________   I have reviewed the triage vital signs and the nursing notes.   HISTORY  Chief Complaint Weakness, Nausea, Fever, and Constipation   History limited by: Not Limited   HPI Arthur Romero is a 79 y.o. male who presents to the emergency department today because of concerns for weakness, bloody urine, abdominal distention and discomfort.  Patient states and symptoms have been going on for roughly 1 week.  He states he went to outside ED for the symptoms without any etiology found.  He then talked to a doctor over the phone and was diagnosed with a UTI.  Patient continues to have symptoms.  Patient describes discomfort in his lower abdomen and left lower abdomen.  Records reviewed. Per medical record review patient has a history of ER visit 8 days ago at OSH for constipation and lower abdominal pain. Per note had CT done with concerning abnormalities. Was treated for constipation.  Past Medical History:  Diagnosis Date  . Arthritis   . GERD (gastroesophageal reflux disease)   . Hypertension   . Rheumatoid arthritis ([place])   . Thyroid disease     Patient Active Problem List   Diagnosis Date Noted  . Aortic atherosclerosis ([place]) 04/26/2020  . Foot pain, bilateral 04/26/2020  . Acquired hypothyroidism 03/15/2020  . Essential hypertension 03/15/2020  . 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 Mixed hyperlipidemia 03/15/2020  . Elevated hemoglobin A1c 03/15/2020  . 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 CARDIAC SURGERY    . SHOULDER SURGERY Right 2016  . TYMPANOPLASTY Right 1987  . vocal cords  1989   polyps    Prior to Admission medications   Medication Sig Start Date End Date Taking? Authorizing Provider  allopurinol (ZYLOPRIM) 300 MG tablet Take 300 mg by mouth daily.   Yes [provider]   amLODipine (NORVASC) 10 MG tablet Take 1 tablet (10 mg total) by mouth daily. 04/18/20  Yes Karamalegos, Devonne Doughty, DO  aspirin 81 MG chewable tablet Chew 81 mg by mouth daily.   Yes [provider]  atorvastatin (LIPITOR) 40 MG tablet Take 1 tablet (40 mg total) by mouth daily. 04/18/20  Yes Karamalegos, Devonne Doughty, DO  cephALEXin (KEFLEX) 500 MG capsule Take 1 capsule (500 mg total) by mouth 3 (three) times daily. For 7 days 05/16/20  Yes Karamalegos, Devonne Doughty, DO  gabapentin (NEURONTIN) 100 MG capsule Start 1 capsule daily at bed time, increase by 1 cap every 2-3 days as tolerated up to take 3 at once in evening. Patient taking differently: Take 200 mg by mouth at bedtime.  04/18/20  Yes Karamalegos, Devonne Doughty, DO  levothyroxine (SYNTHROID) 88 MCG tablet Take 1 tablet (88 mcg total) by mouth daily. 03/27/20  Yes Karamalegos, Devonne Doughty, DO  omeprazole (PRILOSEC) 20 MG capsule Take 1 capsule (20 mg total) by mouth daily. 04/18/20  Yes Karamalegos, Alexander J, DO  ondansetron (ZOFRAN ODT) 4 MG disintegrating tablet Take 1 tablet (4 mg total) by mouth every 8 (eight) hours as needed for nausea or vomiting. 04/26/20  Yes Karamalegos, Devonne Doughty, DO  acetaminophen (TYLENOL) 500 MG tablet Take by mouth.    [provider]    Allergies Ace inhibitors and Prednisone  Family History  Problem Relation Age of Onset  . Hypertension Mother   . Osteoarthritis Mother   . Parkinson's disease Mother   . Heart attack Father 54  .  Tuberculosis Father   . Stroke Brother   . Diabetes Brother     Social History Social History   Tobacco Use  . Smoking status: Never Smoker  . Smokeless tobacco: Never Used  Vaping Use  . Vaping Use: Never used  Substance Use Topics  . Alcohol use: Never    Alcohol/week: 1.0 standard drink    Types: 1 Glasses of wine per week  . Drug use: Never    Review of Systems Constitutional: No fever/chills Eyes: No visual changes. ENT: No sore  throat. Cardiovascular: Denies chest pain. Respiratory: Denies shortness of breath. Gastrointestinal: Positive for abdominal pain. Genitourinary: Positive for hematuria and for dysuria. Musculoskeletal: Negative for back pain. Skin: Negative for rash. Neurological: Negative for headaches, focal weakness or numbness.  ____________________________________________   PHYSICAL EXAM:  VITAL SIGNS: ED Triage Vitals  Enc Vitals Group     BP 05/18/20 1749 (!) 109/53     Pulse Rate 05/18/20 1749 87     Resp 05/18/20 1749 18     Temp 05/18/20 1749 98.8 F (37.1 C)     Temp Source 05/18/20 1749 Oral     SpO2 05/18/20 1749 96 %     Weight 05/18/20 1750 188 lb 0.8 oz (85.3 kg)     Height 05/18/20 1750 6\' 3"  (1.905 m)     Head Circumference --      Peak Flow --      Pain Score 05/18/20 1750 5   Constitutional: Alert and oriented.  Eyes: Conjunctivae are normal.  ENT      Head: Normocephalic and atraumatic.      Nose: No congestion/rhinnorhea.      Mouth/Throat: Mucous membranes are moist.      Neck: No stridor. Hematological/Lymphatic/Immunilogical: No cervical lymphadenopathy. Cardiovascular: Normal rate, regular rhythm.  No murmurs, rubs, or gallops.  Respiratory: Normal respiratory effort without tachypnea nor retractions. Breath sounds are clear and equal bilaterally. No wheezes/rales/rhonchi. Gastrointestinal: Soft and non tender. No rebound. No guarding.  Genitourinary: Deferred Musculoskeletal: Normal range of motion in all extremities. No lower extremity edema. Neurologic:  Normal speech and language. No gross focal neurologic deficits are appreciated.  Skin:  Skin is warm, dry and intact. No rash noted. Psychiatric: Mood and affect are normal. Speech and behavior are normal. Patient exhibits appropriate insight and judgment.  ____________________________________________    LABS (pertinent positives/negatives)  CBC wbc 19.8, hgb 13.2, plt 281 BMP na 116, k 4.5, glu 148,  cr 2.60 Lactic 2.1 ____________________________________________   EKG  I, Nance Pear, attending physician, personally viewed and interpreted this EKG  EKG Time: 1750 Rate: 84 Rhythm: sinus rhythm with 1st degree av block Axis: normal Intervals: qtc 420 QRS: narrow ST changes: no st elevation Impression: abnormal ekg  ____________________________________________    RADIOLOGY  CT abd pel Concerning for pancreatitis  ____________________________________________   PROCEDURES  Procedures  CRITICAL CARE Performed by: Nance Pear   Total critical care time: 30 minutes  Critical care time was exclusive of separately billable procedures and treating other patients.  Critical care was necessary to treat or prevent imminent or life-threatening deterioration.  Critical care was time spent personally by me on the following activities: development of treatment plan with patient and/or surrogate as well as nursing, discussions with consultants, evaluation of patient's response to treatment, examination of patient, obtaining history from patient or surrogate, ordering and performing treatments and interventions, ordering and review of laboratory studies, ordering and review of radiographic studies, pulse oximetry and re-evaluation of  patient's condition.  ____________________________________________   INITIAL IMPRESSION / ASSESSMENT AND PLAN / ED COURSE  Pertinent labs & imaging results that were available during my care of the patient were reviewed by me and considered in my medical decision making (see chart for details).   Patient presented to the emergency department today because of concerns for multiple medical complaints.  On exam patient no acute distress with no significant abdominal tenderness.  Patient blood work was notable for hyponatremia, AKI as well as a leukocytosis.  Patient was given IV fluids to start help correcting the AKI and hyponatremia.  Given  the leukocytosis and that some of the patient's complaints worsen around the abdomen a CT abdomen pelvis was obtained.  Did not show any clear source of infection.  Did raise concerns for pancreatitis and lipase was added to the blood work.  Discussed findings with patient.  Will plan on admission. ____________________________________________   FINAL CLINICAL IMPRESSION(S) / ED DIAGNOSES  Final diagnoses:  Dehydration  Hyponatremia  Weakness     Note: This dictation was prepared with Dragon dictation. Any transcriptional errors that result from this process are unintentional     Nance Pear, MD 05/19/20 [id number]    Nance Pear, MD 05/19/20 [phone]

## 2020-05-18 NOTE — ED Notes (Signed)
EDP at bedside pt attempting urine but unable CT at door to hold

## 2020-05-18 NOTE — ED Notes (Signed)
Landlord/friend called for pt - reports will call sister to update - pt's phone doesn't work in hospital

## 2020-05-18 NOTE — ED Triage Notes (Addendum)
Patient brought in by his neighbor for weakness, fever, cough, nausea X3 days. Also c/o constipation X1 week. Patient also reports falling in kitchen yesterday to the floor from weakness. Denies LOC or hitting head Jeneen Rinks [phone] (patients neighbor who wants to be called once in a room)

## 2020-05-18 NOTE — ED Notes (Signed)
CRITICAL LAB: LACTIC is 2.1, Lab, Dr. Archie Balboa notified, orders received

## 2020-05-18 NOTE — ED Notes (Addendum)
Bed adjusted  Pt reports felling "sick"; emesis bag by the bedside

## 2020-05-19 ENCOUNTER — Observation Stay: Payer: Medicare PPO

## 2020-05-19 DIAGNOSIS — Z20822 Contact with and (suspected) exposure to covid-19: Secondary | ICD-10-CM | POA: Diagnosis present

## 2020-05-19 DIAGNOSIS — A419 Sepsis, unspecified organism: Secondary | ICD-10-CM

## 2020-05-19 DIAGNOSIS — D649 Anemia, unspecified: Secondary | ICD-10-CM | POA: Diagnosis present

## 2020-05-19 DIAGNOSIS — R109 Unspecified abdominal pain: Secondary | ICD-10-CM | POA: Diagnosis present

## 2020-05-19 DIAGNOSIS — E039 Hypothyroidism, unspecified: Secondary | ICD-10-CM | POA: Diagnosis present

## 2020-05-19 DIAGNOSIS — E785 Hyperlipidemia, unspecified: Secondary | ICD-10-CM | POA: Diagnosis present

## 2020-05-19 DIAGNOSIS — E871 Hypo-osmolality and hyponatremia: Secondary | ICD-10-CM

## 2020-05-19 DIAGNOSIS — E860 Dehydration: Secondary | ICD-10-CM | POA: Diagnosis present

## 2020-05-19 DIAGNOSIS — N179 Acute kidney failure, unspecified: Secondary | ICD-10-CM | POA: Diagnosis present

## 2020-05-19 DIAGNOSIS — Z79890 Hormone replacement therapy (postmenopausal): Secondary | ICD-10-CM | POA: Diagnosis not present

## 2020-05-19 DIAGNOSIS — E440 Moderate protein-calorie malnutrition: Secondary | ICD-10-CM | POA: Diagnosis present

## 2020-05-19 DIAGNOSIS — E222 Syndrome of inappropriate secretion of antidiuretic hormone: Secondary | ICD-10-CM | POA: Diagnosis present

## 2020-05-19 DIAGNOSIS — I4819 Other persistent atrial fibrillation: Secondary | ICD-10-CM | POA: Diagnosis not present

## 2020-05-19 DIAGNOSIS — Z823 Family history of stroke: Secondary | ICD-10-CM | POA: Diagnosis not present

## 2020-05-19 DIAGNOSIS — R531 Weakness: Secondary | ICD-10-CM | POA: Diagnosis not present

## 2020-05-19 DIAGNOSIS — I480 Paroxysmal atrial fibrillation: Secondary | ICD-10-CM | POA: Diagnosis not present

## 2020-05-19 DIAGNOSIS — R1084 Generalized abdominal pain: Secondary | ICD-10-CM | POA: Diagnosis not present

## 2020-05-19 DIAGNOSIS — I340 Nonrheumatic mitral (valve) insufficiency: Secondary | ICD-10-CM | POA: Diagnosis not present

## 2020-05-19 DIAGNOSIS — M109 Gout, unspecified: Secondary | ICD-10-CM | POA: Diagnosis present

## 2020-05-19 DIAGNOSIS — Z6824 Body mass index (BMI) 24.0-24.9, adult: Secondary | ICD-10-CM | POA: Diagnosis not present

## 2020-05-19 DIAGNOSIS — R339 Retention of urine, unspecified: Secondary | ICD-10-CM | POA: Diagnosis present

## 2020-05-19 DIAGNOSIS — R627 Adult failure to thrive: Secondary | ICD-10-CM | POA: Diagnosis present

## 2020-05-19 DIAGNOSIS — K5900 Constipation, unspecified: Secondary | ICD-10-CM | POA: Diagnosis present

## 2020-05-19 DIAGNOSIS — R338 Other retention of urine: Secondary | ICD-10-CM | POA: Diagnosis not present

## 2020-05-19 DIAGNOSIS — I10 Essential (primary) hypertension: Secondary | ICD-10-CM | POA: Diagnosis present

## 2020-05-19 DIAGNOSIS — R7303 Prediabetes: Secondary | ICD-10-CM | POA: Diagnosis present

## 2020-05-19 DIAGNOSIS — I4821 Permanent atrial fibrillation: Secondary | ICD-10-CM | POA: Diagnosis present

## 2020-05-19 DIAGNOSIS — I361 Nonrheumatic tricuspid (valve) insufficiency: Secondary | ICD-10-CM | POA: Diagnosis not present

## 2020-05-19 DIAGNOSIS — Z833 Family history of diabetes mellitus: Secondary | ICD-10-CM | POA: Diagnosis not present

## 2020-05-19 DIAGNOSIS — Z8249 Family history of ischemic heart disease and other diseases of the circulatory system: Secondary | ICD-10-CM | POA: Diagnosis not present

## 2020-05-19 DIAGNOSIS — G629 Polyneuropathy, unspecified: Secondary | ICD-10-CM | POA: Diagnosis present

## 2020-05-19 DIAGNOSIS — M069 Rheumatoid arthritis, unspecified: Secondary | ICD-10-CM | POA: Diagnosis present

## 2020-05-19 DIAGNOSIS — R1310 Dysphagia, unspecified: Secondary | ICD-10-CM | POA: Diagnosis present

## 2020-05-19 DIAGNOSIS — K219 Gastro-esophageal reflux disease without esophagitis: Secondary | ICD-10-CM | POA: Diagnosis present

## 2020-05-19 LAB — URINALYSIS, COMPLETE (UACMP) WITH MICROSCOPIC
Bacteria, UA: NONE SEEN
Bilirubin Urine: NEGATIVE
Glucose, UA: NEGATIVE mg/dL
Hgb urine dipstick: NEGATIVE
Ketones, ur: NEGATIVE mg/dL
Leukocytes,Ua: NEGATIVE
Nitrite: NEGATIVE
Protein, ur: NEGATIVE mg/dL
Specific Gravity, Urine: 1.016 (ref 1.005–1.030)
Squamous Epithelial / HPF: NONE SEEN (ref 0–5)
pH: 5 (ref 5.0–8.0)

## 2020-05-19 LAB — BASIC METABOLIC PANEL
Anion gap: 11 (ref 5–15)
Anion gap: 9 (ref 5–15)
Anion gap: 8 (ref 5–15)
Anion gap: 11 (ref 5–15)
Anion gap: 8 (ref 5–15)
BUN: 44 mg/dL — ABNORMAL HIGH (ref 8–23)
BUN: 40 mg/dL — ABNORMAL HIGH (ref 8–23)
BUN: 43 mg/dL — ABNORMAL HIGH (ref 8–23)
BUN: 41 mg/dL — ABNORMAL HIGH (ref 8–23)
BUN: 38 mg/dL — ABNORMAL HIGH (ref 8–23)
CO2: 19 mmol/L — ABNORMAL LOW (ref 22–32)
CO2: 20 mmol/L — ABNORMAL LOW (ref 22–32)
CO2: 20 mmol/L — ABNORMAL LOW (ref 22–32)
CO2: 18 mmol/L — ABNORMAL LOW (ref 22–32)
CO2: 18 mmol/L — ABNORMAL LOW (ref 22–32)
Calcium: 8.1 mg/dL — ABNORMAL LOW (ref 8.9–10.3)
Calcium: 7.7 mg/dL — ABNORMAL LOW (ref 8.9–10.3)
Calcium: 7.8 mg/dL — ABNORMAL LOW (ref 8.9–10.3)
Calcium: 7.8 mg/dL — ABNORMAL LOW (ref 8.9–10.3)
Calcium: 7.4 mg/dL — ABNORMAL LOW (ref 8.9–10.3)
Chloride: 88 mmol/L — ABNORMAL LOW (ref 98–111)
Chloride: 88 mmol/L — ABNORMAL LOW (ref 98–111)
Chloride: 89 mmol/L — ABNORMAL LOW (ref 98–111)
Chloride: 89 mmol/L — ABNORMAL LOW (ref 98–111)
Chloride: 96 mmol/L — ABNORMAL LOW (ref 98–111)
Creatinine, Ser: 2 mg/dL — ABNORMAL HIGH (ref 0.61–1.24)
Creatinine, Ser: 1.95 mg/dL — ABNORMAL HIGH (ref 0.61–1.24)
Creatinine, Ser: 1.85 mg/dL — ABNORMAL HIGH (ref 0.61–1.24)
Creatinine, Ser: 1.82 mg/dL — ABNORMAL HIGH (ref 0.61–1.24)
Creatinine, Ser: 1.59 mg/dL — ABNORMAL HIGH (ref 0.61–1.24)
GFR calc Af Amer: 36 mL/min — ABNORMAL LOW (ref >60)
GFR calc Af Amer: 38 mL/min — ABNORMAL LOW (ref >60)
GFR calc Af Amer: 40 mL/min — ABNORMAL LOW (ref >60)
GFR calc Af Amer: 41 mL/min — ABNORMAL LOW (ref >60)
GFR calc Af Amer: 48 mL/min — ABNORMAL LOW (ref >60)
GFR calc non Af Amer: 31 mL/min — ABNORMAL LOW (ref >60)
GFR calc non Af Amer: 32 mL/min — ABNORMAL LOW (ref >60)
GFR calc non Af Amer: 35 mL/min — ABNORMAL LOW (ref >60)
GFR calc non Af Amer: 35 mL/min — ABNORMAL LOW (ref >60)
GFR calc non Af Amer: 42 mL/min — ABNORMAL LOW (ref >60)
Glucose, Bld: 131 mg/dL — ABNORMAL HIGH (ref 70–99)
Glucose, Bld: 180 mg/dL — ABNORMAL HIGH (ref 70–99)
Glucose, Bld: 202 mg/dL — ABNORMAL HIGH (ref 70–99)
Glucose, Bld: 153 mg/dL — ABNORMAL HIGH (ref 70–99)
Glucose, Bld: 139 mg/dL — ABNORMAL HIGH (ref 70–99)
Potassium: 4.3 mmol/L (ref 3.5–5.1)
Potassium: 4 mmol/L (ref 3.5–5.1)
Potassium: 4.2 mmol/L (ref 3.5–5.1)
Potassium: 4.6 mmol/L (ref 3.5–5.1)
Potassium: 3.9 mmol/L (ref 3.5–5.1)
Sodium: 118 mmol/L — CL (ref 135–145)
Sodium: 117 mmol/L — CL (ref 135–145)
Sodium: 117 mmol/L — CL (ref 135–145)
Sodium: 118 mmol/L — CL (ref 135–145)
Sodium: 122 mmol/L — ABNORMAL LOW (ref 135–145)

## 2020-05-19 LAB — OSMOLALITY: Osmolality: 261 mOsm/kg — ABNORMAL LOW (ref 275–295)

## 2020-05-19 LAB — CBC
HCT: 35.9 % — ABNORMAL LOW (ref 39.0–52.0)
HCT: 35.5 % — ABNORMAL LOW (ref 39.0–52.0)
Hemoglobin: 12.9 g/dL — ABNORMAL LOW (ref 13.0–17.0)
Hemoglobin: 12.8 g/dL — ABNORMAL LOW (ref 13.0–17.0)
MCH: 31.9 pg (ref 26.0–34.0)
MCH: 31.8 pg (ref 26.0–34.0)
MCHC: 35.9 g/dL (ref 30.0–36.0)
MCHC: 36.1 g/dL — ABNORMAL HIGH (ref 30.0–36.0)
MCV: 88.6 fL (ref 80.0–100.0)
MCV: 88.1 fL (ref 80.0–100.0)
Platelets: 252 10*3/uL (ref 150–400)
Platelets: 227 10*3/uL (ref 150–400)
RBC: 4.05 MIL/uL — ABNORMAL LOW (ref 4.22–5.81)
RBC: 4.03 MIL/uL — ABNORMAL LOW (ref 4.22–5.81)
RDW: 12.7 % (ref 11.5–15.5)
RDW: 12.6 % (ref 11.5–15.5)
WBC: 16.9 10*3/uL — ABNORMAL HIGH (ref 4.0–10.5)
WBC: 14.7 10*3/uL — ABNORMAL HIGH (ref 4.0–10.5)
nRBC: 0 % (ref 0.0–0.2)
nRBC: 0 % (ref 0.0–0.2)

## 2020-05-19 LAB — SODIUM, URINE, RANDOM: Sodium, Ur: <10 mmol/L

## 2020-05-19 LAB — SODIUM: Sodium: 119 mmol/L — CL (ref 135–145)

## 2020-05-19 LAB — MRSA PCR SCREENING: MRSA by PCR: NEGATIVE

## 2020-05-19 LAB — LIPASE, BLOOD: Lipase: 29 U/L (ref 11–51)

## 2020-05-19 LAB — CREATININE, SERUM
Creatinine, Ser: 2.18 mg/dL — ABNORMAL HIGH (ref 0.61–1.24)
GFR calc Af Amer: 33 mL/min — ABNORMAL LOW (ref >60)
GFR calc non Af Amer: 28 mL/min — ABNORMAL LOW (ref >60)

## 2020-05-19 LAB — TSH: TSH: 1.55 u[IU]/mL (ref 0.350–4.500)

## 2020-05-19 LAB — LACTIC ACID, PLASMA: Lactic Acid, Venous: 1.3 mmol/L (ref 0.5–1.9)

## 2020-05-19 LAB — SARS CORONAVIRUS 2 BY RT PCR (HOSPITAL ORDER, PERFORMED IN ~~LOC~~ HOSPITAL LAB): SARS Coronavirus 2: NEGATIVE

## 2020-05-19 LAB — OSMOLALITY, URINE: Osmolality, Ur: 472 mOsm/kg (ref 300–900)

## 2020-05-19 LAB — GLUCOSE, CAPILLARY: Glucose-Capillary: 177 mg/dL — ABNORMAL HIGH (ref 70–99)

## 2020-05-19 LAB — CREATININE, URINE, RANDOM: Creatinine, Urine: 170 mg/dL

## 2020-05-19 IMAGING — DX DG CHEST 1V PORT
1 series · 1 of 1 positions shown · non-contrast
Comparison: Chest x-ray dated [DATE]

CLINICAL DATA: Sepsis.

EXAM:
PORTABLE CHEST 1 VIEW

[chest ap]
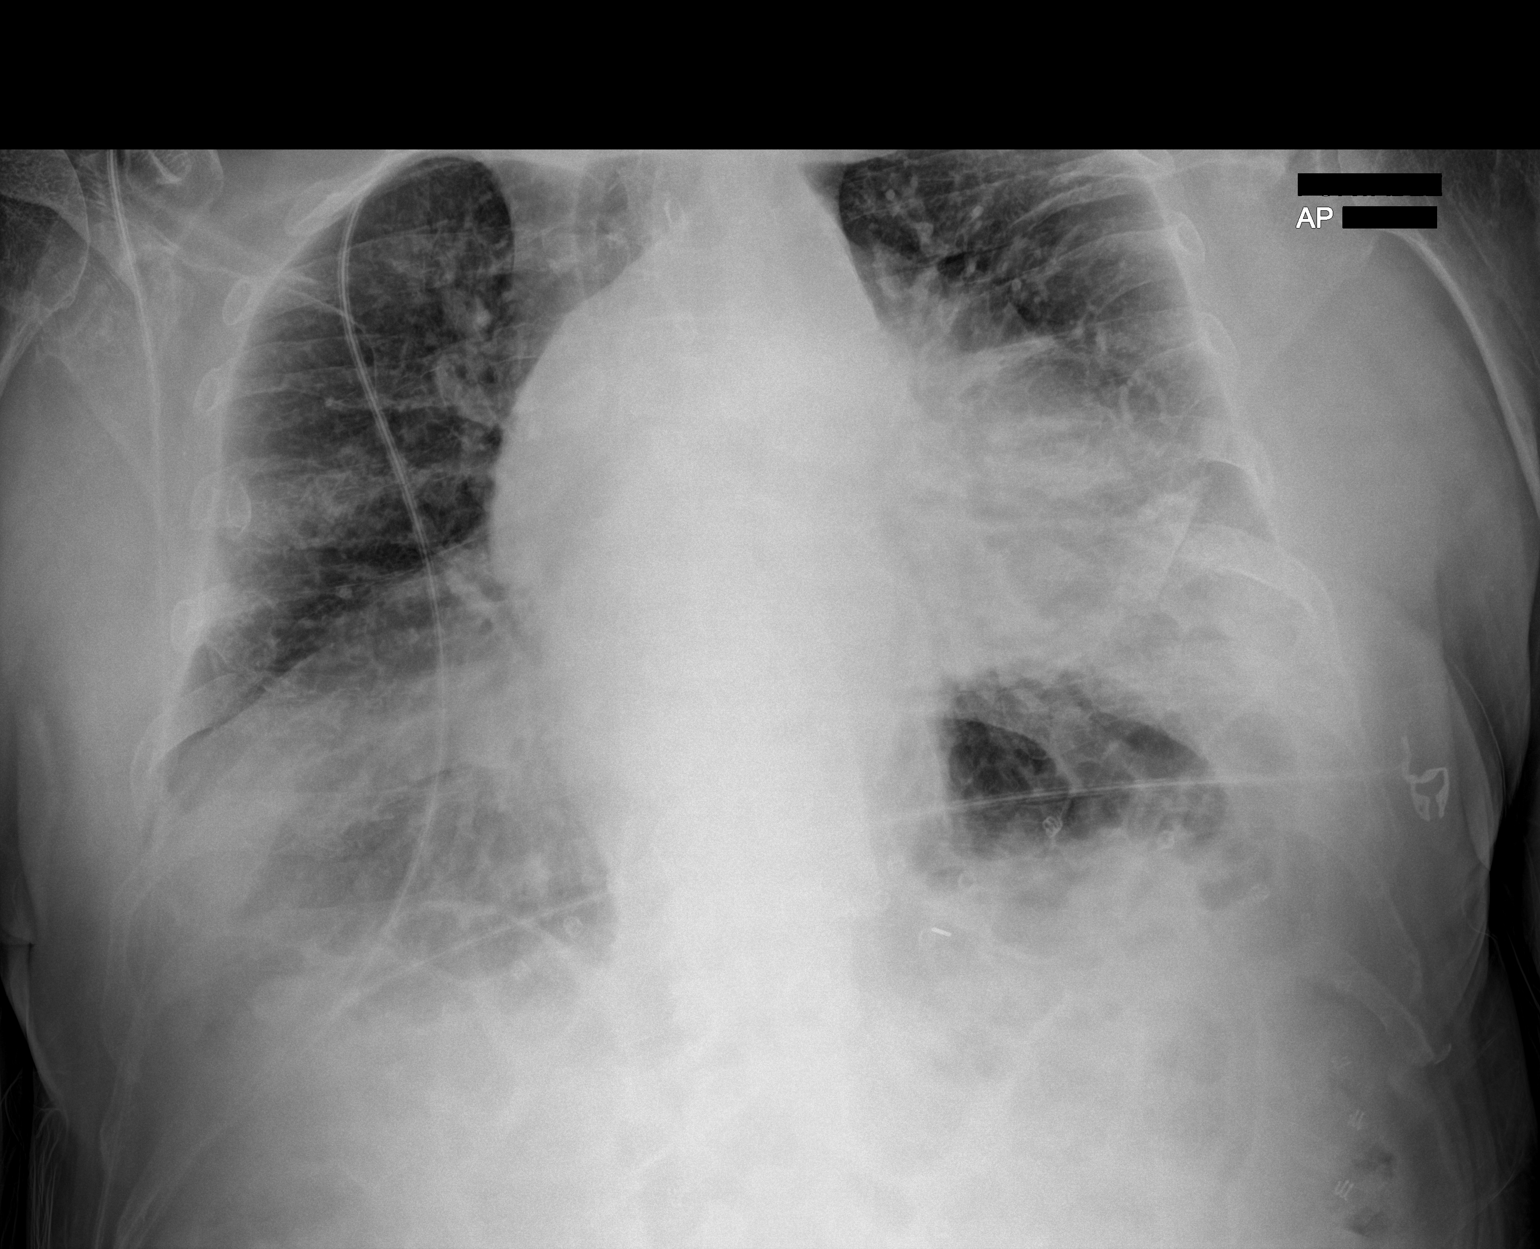

[1 of 1 positions shown; findings below may reference images not displayed]

FINDINGS: The lung volumes are low. The heart size is enlarged. Bibasilar
airspace opacities are noted and are favored to represent
atelectasis. There are trace bilateral pleural effusions. There is
no acute osseous abnormality.
IMPRESSION: 1. Cardiomegaly with trace bilateral pleural effusions.
2. Bibasilar airspace opacities are favored to represent
atelectasis. Superimposed infiltrate is not excluded.

## 2020-05-19 MED ORDER — ACETAMINOPHEN 325 MG PO TABS
650.0000 mg | ORAL_TABLET | Freq: Four times a day (QID) | ORAL | Status: DC | PRN
  Administered 2020-05-19: 650 mg via ORAL
  Filled 2020-05-19 (×2): qty 2

## 2020-05-19 MED ORDER — ALLOPURINOL 300 MG PO TABS: 300.0000 mg | ORAL_TABLET | Freq: Every day | ORAL | Status: DC

## 2020-05-19 MED ORDER — SODIUM CHLORIDE 0.9 % IV BOLUS
1000.0000 mL | Freq: Once | INTRAVENOUS | Status: AC
  Administered 2020-05-19: 1000 mL via INTRAVENOUS

## 2020-05-19 MED ORDER — ASPIRIN 81 MG PO CHEW
81.0000 mg | CHEWABLE_TABLET | Freq: Every day | ORAL | Status: DC
  Administered 2020-05-19 – 2020-05-21 (×3): 81 mg via ORAL
  Filled 2020-05-19 (×3): qty 1

## 2020-05-19 MED ORDER — SODIUM CHLORIDE 0.9 % IV SOLN
2.0000 g | Freq: Two times a day (BID) | INTRAVENOUS | Status: DC
  Administered 2020-05-19 – 2020-05-20 (×3): 2 g via INTRAVENOUS
  Filled 2020-05-19 (×4): qty 2

## 2020-05-19 MED ORDER — ENOXAPARIN SODIUM 40 MG/0.4ML ~~LOC~~ SOLN: 40.0000 mg | SUBCUTANEOUS | Status: DC

## 2020-05-19 MED ORDER — AMLODIPINE BESYLATE 10 MG PO TABS
10.0000 mg | ORAL_TABLET | Freq: Every day | ORAL | Status: DC
  Administered 2020-05-19 – 2020-05-21 (×3): 10 mg via ORAL
  Filled 2020-05-19: qty 1
  Filled 2020-05-19: qty 2
  Filled 2020-05-19: qty 1

## 2020-05-19 MED ORDER — VANCOMYCIN HCL IN DEXTROSE 1-5 GM/200ML-% IV SOLN
1000.0000 mg | Freq: Once | INTRAVENOUS | Status: AC
  Administered 2020-05-19: 1000 mg via INTRAVENOUS
  Filled 2020-05-19: qty 200

## 2020-05-19 MED ORDER — GABAPENTIN 100 MG PO CAPS: 200.0000 mg | ORAL_CAPSULE | Freq: Every day | ORAL | Status: DC

## 2020-05-19 MED ORDER — TAMSULOSIN HCL 0.4 MG PO CAPS
0.4000 mg | ORAL_CAPSULE | Freq: Every day | ORAL | Status: DC
  Administered 2020-05-19 – 2020-05-24 (×6): 0.4 mg via ORAL
  Filled 2020-05-19 (×7): qty 1

## 2020-05-19 MED ORDER — LEVOTHYROXINE SODIUM 88 MCG PO TABS
88.0000 ug | ORAL_TABLET | Freq: Every day | ORAL | Status: DC
  Administered 2020-05-19 – 2020-05-24 (×6): 88 ug via ORAL
  Filled 2020-05-19 (×7): qty 1

## 2020-05-19 MED ORDER — SODIUM CHLORIDE 0.9 % IV SOLN: INTRAVENOUS | Status: DC

## 2020-05-19 MED ORDER — PANTOPRAZOLE SODIUM 40 MG PO TBEC
40.0000 mg | DELAYED_RELEASE_TABLET | Freq: Every day | ORAL | Status: DC
  Administered 2020-05-19 – 2020-05-24 (×6): 40 mg via ORAL
  Filled 2020-05-19 (×6): qty 1

## 2020-05-19 MED ORDER — HYDROCODONE-ACETAMINOPHEN 5-325 MG PO TABS: 1.0000 | ORAL_TABLET | ORAL | Status: DC | PRN

## 2020-05-19 MED ORDER — ENOXAPARIN SODIUM 40 MG/0.4ML ~~LOC~~ SOLN
40.0000 mg | SUBCUTANEOUS | Status: DC
  Administered 2020-05-19 – 2020-05-21 (×3): 40 mg via SUBCUTANEOUS
  Filled 2020-05-19 (×3): qty 0.4

## 2020-05-19 MED ORDER — VANCOMYCIN HCL IN DEXTROSE 1-5 GM/200ML-% IV SOLN: 1000.0000 mg | INTRAVENOUS | Status: DC

## 2020-05-19 MED ORDER — SODIUM CHLORIDE 0.9 % IV SOLN: 2.0000 g | Freq: Once | INTRAVENOUS | Status: DC

## 2020-05-19 MED ORDER — VANCOMYCIN HCL IN DEXTROSE 1-5 GM/200ML-% IV SOLN: 1000.0000 mg | Freq: Once | INTRAVENOUS | Status: DC

## 2020-05-19 MED ORDER — METRONIDAZOLE IN NACL 5-0.79 MG/ML-% IV SOLN
500.0000 mg | Freq: Three times a day (TID) | INTRAVENOUS | Status: DC
  Administered 2020-05-19 – 2020-05-20 (×5): 500 mg via INTRAVENOUS
  Filled 2020-05-19 (×6): qty 100

## 2020-05-19 MED ORDER — SODIUM CHLORIDE 3 % IV SOLN: INTRAVENOUS | Status: DC

## 2020-05-19 MED ORDER — SODIUM CHLORIDE 0.9 % IV SOLN
2.0000 g | Freq: Once | INTRAVENOUS | Status: AC
  Administered 2020-05-19: 2 g via INTRAVENOUS
  Filled 2020-05-19: qty 2

## 2020-05-19 MED ORDER — VANCOMYCIN HCL 1250 MG/250ML IV SOLN
1250.0000 mg | INTRAVENOUS | Status: DC
  Administered 2020-05-19: 1250 mg via INTRAVENOUS
  Filled 2020-05-19 (×2): qty 250

## 2020-05-19 MED ORDER — ONDANSETRON HCL 4 MG PO TABS: 4.0000 mg | ORAL_TABLET | Freq: Four times a day (QID) | ORAL | Status: DC | PRN

## 2020-05-19 MED ORDER — ATORVASTATIN CALCIUM 20 MG PO TABS
40.0000 mg | ORAL_TABLET | Freq: Every day | ORAL | Status: DC
  Administered 2020-05-19 – 2020-05-24 (×6): 40 mg via ORAL
  Filled 2020-05-19 (×6): qty 2

## 2020-05-19 MED ORDER — MORPHINE SULFATE (PF) 2 MG/ML IV SOLN: 2.0000 mg | INTRAVENOUS | Status: DC | PRN

## 2020-05-19 MED ORDER — DOCUSATE SODIUM 100 MG PO CAPS
100.0000 mg | ORAL_CAPSULE | Freq: Two times a day (BID) | ORAL | Status: DC
  Administered 2020-05-19 – 2020-05-23 (×11): 100 mg via ORAL
  Filled 2020-05-19 (×11): qty 1

## 2020-05-19 MED ORDER — ONDANSETRON HCL 4 MG/2ML IJ SOLN
4.0000 mg | Freq: Four times a day (QID) | INTRAMUSCULAR | Status: DC | PRN
  Administered 2020-05-20 – 2020-05-21 (×2): 4 mg via INTRAVENOUS
  Filled 2020-05-19 (×2): qty 2

## 2020-05-19 MED ORDER — ACETAMINOPHEN 650 MG RE SUPP: 650.0000 mg | Freq: Four times a day (QID) | RECTAL | Status: DC | PRN

## 2020-05-19 MED ORDER — CHLORHEXIDINE GLUCONATE CLOTH 2 % EX PADS
6.0000 | MEDICATED_PAD | Freq: Every day | CUTANEOUS | Status: DC
  Administered 2020-05-19 – 2020-05-23 (×5): 6 via TOPICAL

## 2020-05-19 NOTE — Progress Notes (Signed)
Pharmacy Antibiotic Note  Arthur Romero is a 79 y.o. male admitted on 05/18/2020 with sepsis.  Pharmacy has been consulted for Vancomycin and Cefepime dosing.  Plan: Cefepime 2gm IV q12hrs  Vancomycin 1000 mg IV Q 24 hrs. Goal AUC 400-550. Expected AUC: 493.4, Css min 13.9 SCr used: 2.18 Pt did not receive a full loading dose, will start next dose early  Height: 6\' 3"  (190.5 cm) Weight: 85.3 kg (188 lb 0.8 oz) IBW/kg (Calculated) : 84.5  Temp (24hrs), Avg:98.8 F (37.1 C), Min:98.8 F (37.1 C), Max:98.8 F (37.1 C)  Recent Labs  Lab 05/18/20 1754 05/18/20 2103 05/19/20 0048  WBC 19.8*  --  16.9*  CREATININE 2.60*  --  2.18*  LATICACIDVEN  --  2.1* 1.3    Estimated Creatinine Clearance: 34.5 mL/min (A) (by C-G formula based on SCr of 2.18 mg/dL (H)).    Allergies  Allergen Reactions   Ace Inhibitors Swelling    Lip swelling Lip swelling Lip swelling    Prednisone Hives    Antimicrobials this admission:   >>    >>   Dose adjustments this admission:   Microbiology results:  BCx:   UCx:    Sputum:    MRSA PCR:   Thank you for allowing pharmacy to be a part of this patients care.  Hart Robinsons A 05/19/2020 3:55 AM

## 2020-05-19 NOTE — ED Notes (Signed)
Provider team messaged for bladder scan

## 2020-05-19 NOTE — Progress Notes (Signed)
ANTICOAGULATION CONSULT NOTE - Initial Consult  Pharmacy Consult for Lovenox Indication: VTE prophylaxis  Allergies  Allergen Reactions  . Ace Inhibitors Swelling    Lip swelling Lip swelling Lip swelling   . Prednisone Hives    Patient Measurements: Height: 6\' 3"  (190.5 cm) Weight: 85.3 kg (188 lb 0.8 oz) IBW/kg (Calculated) : 84.5  Vital Signs: Temp: 98.8 F (37.1 C) (07/24 1749) Temp Source: Oral (07/24 1749) BP: 117/65 (07/25 0100) Pulse Rate: 42 (07/25 0100)  Labs: Recent Labs    05/18/20 1754 05/19/20 0048  HGB 13.2 12.9*  HCT 35.7* 35.9*  PLT 281 252  CREATININE 2.60* 2.18*    Estimated Creatinine Clearance: 34.5 mL/min (A) (by C-G formula based on SCr of 2.18 mg/dL (H)).   Medical History: Past Medical History:  Diagnosis Date  . Arthritis   . GERD (gastroesophageal reflux disease)   . Hypertension   . Rheumatoid arthritis ([place])   . Thyroid disease     Medications:  (Not in a hospital admission)   Assessment: 79yo male w/ clcr > 30 ml/min.  Lovenox for VTE px.  Goal of Therapy:  VTE prophylaxis Monitor platelets by anticoagulation protocol: Yes   Plan:  Lovenox 40mg  SQ q24hrs  [place] Crane, Tad Fancher A 05/19/2020,1:37 AM

## 2020-05-19 NOTE — Progress Notes (Signed)
Pharmacy Antibiotic Note  Arthur Romero is a 79 y.o. male admitted on 05/18/2020 with sepsis.  Pharmacy has been consulted for Vancomycin and Cefepime dosing.  Plan: Cefepime 2gm IV q12hrs  Vancomycin 1000 mg IV Q 24 hrs. Goal AUC 400-550. Expected AUC: 493.4, Css min 13.9 SCr used: 2.18 Pt did not receive a full loading dose, will start next dose early  7/25: Scr improved to 1.95  Crcl 38.5 ml/min. Will adjust Vancomycin to 1250 mg IV q24h.  F/u renal fxn and source of sepsis    Height: 6\' 3"  (190.5 cm) Weight: 85.3 kg (188 lb 0.8 oz) IBW/kg (Calculated) : 84.5  Temp (24hrs), Avg:98.8 F (37.1 C), Min:98.8 F (37.1 C), Max:98.8 F (37.1 C)  Recent Labs  Lab 05/18/20 1754 05/18/20 2103 05/19/20 0048 05/19/20 0628 05/19/20 0938  WBC 19.8*  --  16.9* 14.7*  --   CREATININE 2.60*  --  2.18* 2.00* 1.95*  LATICACIDVEN  --  2.1* 1.3  --   --     Estimated Creatinine Clearance: 38.5 mL/min (A) (by C-G formula based on SCr of 1.95 mg/dL (H)).    Allergies  Allergen Reactions  . Ace Inhibitors Swelling    Lip swelling Lip swelling Lip swelling   . Prednisone Hives    Antimicrobials this admission:   >>    >>   Dose adjustments this admission:   Microbiology results:  BCx: NGx12hrs  UCx:    Sputum:    MRSA PCR:   Thank you for allowing pharmacy to be a part of this patient's care.  Jakyrah Holladay A 05/19/2020 1:28 PM

## 2020-05-19 NOTE — ED Notes (Signed)
Pt given lunch meal tray at this time.  

## 2020-05-19 NOTE — ED Notes (Signed)
Spoke with the Charge RN upstairs, Agricultural consultant the pt has not been approved yet.

## 2020-05-19 NOTE — H&P (Signed)
History and Physical    ADRIN JULIAN EPP:[id number] DOB: January 23, 1941 DOA: 05/18/2020  PCP: Olin Hauser, DO   Patient coming from: Home  I have personally briefly reviewed patient's old medical records in [place]  Chief Complaint: Weakness, nausea x1 week, abdominal pain  HPI: Arthur Romero is a 79 y.o. male with medical history significant for hypertension, gout, GERD, hypothyroidism who presents with a 1 week history of generalized malaise, feeling weak and tired, hot and sweaty, with nausea and poor oral intake for the past 3 days, now with abdominal pain and constipation. States he felt so weak he fell on the floor yesterday but did not sustain injury. Went to the ED in [place] on 05/10/2020 about 10 days prior and had blood work and a CT abdomen with no acute findings. Urinalysis was unremarkable. He spoke with his PCP on 05/16/2020, who started him on Keflex for suspected UTI. Patient presents today due to persistent progressive weakness. He denies having a fever. His discomfort is suprapubic and the pain is described as dull and moderate intensity, nonradiating. No aggravating or alleviating factors ED Course: On arrival in the emergency room, vitals were within normal limits. Blood work showed WBC of 20,000, but lactic acid 2.1, sodium 116, creatinine 2.6, up from 0.8. Lipase normal. CT abdomen and pelvis showing distended bladder. Patient was given a normal saline bolus. Started on broad-spectrum antibiotics for sepsis of unknown source. Hospitalist consulted for admission. Chest x-ray and urinalysis pending at time of request for admission.  Review of Systems: As per HPI otherwise all other systems on review of systems negative.    Past Medical History:  Diagnosis Date  . Arthritis   . GERD (gastroesophageal reflux disease)   . Hypertension   . Rheumatoid arthritis ([place])   . Thyroid disease     Past Surgical History:  Procedure Laterality Date  .  CARDIAC SURGERY    . SHOULDER SURGERY Right 2016  . TYMPANOPLASTY Right 1987  . vocal cords  1989   polyps     reports that he has never smoked. He has never used smokeless tobacco. He reports that he does not drink alcohol and does not use drugs.  Allergies  Allergen Reactions  . Ace Inhibitors Swelling    Lip swelling Lip swelling Lip swelling   . Prednisone Hives    Family History  Problem Relation Age of Onset  . Hypertension Mother   . Osteoarthritis Mother   . Parkinson's disease Mother   . Heart attack Father 89  . Tuberculosis Father   . Stroke Brother   . Diabetes Brother       Prior to Admission medications   Medication Sig Start Date End Date Taking? Authorizing Provider  allopurinol (ZYLOPRIM) 300 MG tablet Take 300 mg by mouth daily.   Yes [provider]  amLODipine (NORVASC) 10 MG tablet Take 1 tablet (10 mg total) by mouth daily. 04/18/20  Yes Karamalegos, Devonne Doughty, DO  aspirin 81 MG chewable tablet Chew 81 mg by mouth daily.   Yes [provider]  atorvastatin (LIPITOR) 40 MG tablet Take 1 tablet (40 mg total) by mouth daily. 04/18/20  Yes Karamalegos, Devonne Doughty, DO  cephALEXin (KEFLEX) 500 MG capsule Take 1 capsule (500 mg total) by mouth 3 (three) times daily. For 7 days 05/16/20  Yes Karamalegos, Devonne Doughty, DO  gabapentin (NEURONTIN) 100 MG capsule Start 1 capsule daily at bed time, increase by 1 cap every 2-3 days  as tolerated up to take 3 at once in evening. Patient taking differently: Take 200 mg by mouth at bedtime.  04/18/20  Yes Karamalegos, Devonne Doughty, DO  levothyroxine (SYNTHROID) 88 MCG tablet Take 1 tablet (88 mcg total) by mouth daily. 03/27/20  Yes Karamalegos, Devonne Doughty, DO  omeprazole (PRILOSEC) 20 MG capsule Take 1 capsule (20 mg total) by mouth daily. 04/18/20  Yes Karamalegos, Alexander J, DO  ondansetron (ZOFRAN ODT) 4 MG disintegrating tablet Take 1 tablet (4 mg total) by mouth every 8 (eight) hours as needed for  nausea or vomiting. 04/26/20  Yes Karamalegos, Devonne Doughty, DO  acetaminophen (TYLENOL) 500 MG tablet Take by mouth.    [provider]    Physical Exam: Vitals:   05/18/20 2000 05/18/20 2044 05/18/20 2100 05/19/20 0044  BP: (!) 119/58  106/79 (!) 112/63  Pulse: 85 85 89 87  Resp: 20 17 21 20   Temp:      TempSrc:      SpO2: 96% 99% 97% 94%  Weight:      Height:         Vitals:   05/18/20 2000 05/18/20 2044 05/18/20 2100 05/19/20 0044  BP: (!) 119/58  106/79 (!) 112/63  Pulse: 85 85 89 87  Resp: 20 17 21 20   Temp:      TempSrc:      SpO2: 96% 99% 97% 94%  Weight:      Height:          Constitutional:  Somewhat ill-appearing but no acute distress.  Alert and oriented x 3 . HEENT:      Head: Normocephalic and atraumatic.         Eyes: PERLA, EOMI, Conjunctivae are normal. Sclera is non-icteric.       Mouth/Throat: Mucous membranes are moist.       Neck: Supple with no signs of meningismus. Cardiovascular: Regular rate and rhythm. No murmurs, gallops, or rubs. 2+ symmetrical distal pulses are present . No JVD. No LE edema Respiratory: Respiratory effort normal .Lungs sounds clear bilaterally. No wheezes, crackles, or rhonchi.  Gastrointestinal: Soft, mild tenderness in suprapubic, and non distended with positive bowel sounds. No rebound or guarding. Genitourinary: No CVA tenderness.  Has Foley catheter placed in the ER Musculoskeletal: Nontender with normal range of motion in all extremities. No cyanosis, or erythema of extremities. Neurologic: Normal speech and language. Face is symmetric. Moving all extremities. No gross focal neurologic deficits . Skin: Skin is warm, dry.  No rash or ulcers Psychiatric: Mood and affect are normal Speech and behavior are normal   Labs on Admission: I have personally reviewed following labs and imaging studies  CBC: Recent Labs  Lab 05/18/20 1754  WBC 19.8*  HGB 13.2  HCT 35.7*  MCV 86.9  PLT 403   Basic Metabolic  Panel: Recent Labs  Lab 05/18/20 1754  NA 116*  K 4.5  CL 83*  CO2 19*  GLUCOSE 148*  BUN 45*  CREATININE 2.60*  CALCIUM 8.7*   GFR: Estimated Creatinine Clearance: 28.9 mL/min (A) (by C-G formula based on SCr of 2.6 mg/dL (H)). Liver Function Tests: No results for input(s): AST, ALT, ALKPHOS, BILITOT, PROT, ALBUMIN in the last 168 hours. Recent Labs  Lab 05/18/20 1754  LIPASE 29   No results for input(s): AMMONIA in the last 168 hours. Coagulation Profile: No results for input(s): INR, PROTIME in the last 168 hours. Cardiac Enzymes: No results for input(s): CKTOTAL, CKMB, CKMBINDEX, TROPONINI in the last 168  hours. BNP (last 3 results) No results for input(s): PROBNP in the last 8760 hours. HbA1C: No results for input(s): HGBA1C in the last 72 hours. CBG: No results for input(s): GLUCAP in the last 168 hours. Lipid Profile: No results for input(s): CHOL, HDL, LDLCALC, TRIG, CHOLHDL, LDLDIRECT in the last 72 hours. Thyroid Function Tests: No results for input(s): TSH, T4TOTAL, FREET4, T3FREE, THYROIDAB in the last 72 hours. Anemia Panel: No results for input(s): VITAMINB12, FOLATE, FERRITIN, TIBC, IRON, RETICCTPCT in the last 72 hours. Urine analysis:    Component Value Date/Time   COLORURINE STRAW (A) 04/19/2020 1006   APPEARANCEUR CLEAR (A) 04/19/2020 1006   LABSPEC 1.003 (L) 04/19/2020 1006   PHURINE 8.0 04/19/2020 1006   GLUCOSEU NEGATIVE 04/19/2020 1006   HGBUR MODERATE (A) 04/19/2020 1006   BILIRUBINUR NEGATIVE 04/19/2020 1006   KETONESUR NEGATIVE 04/19/2020 1006   PROTEINUR NEGATIVE 04/19/2020 1006   NITRITE NEGATIVE 04/19/2020 1006   LEUKOCYTESUR NEGATIVE 04/19/2020 1006    Radiological Exams on Admission: CT ABDOMEN PELVIS WO CONTRAST  Result Date: 05/18/2020 CLINICAL DATA:  Fever. Cough. Nausea x3 days. Constipation x1 week. EXAM: CT ABDOMEN AND PELVIS WITHOUT CONTRAST TECHNIQUE: Multidetector CT imaging of the abdomen and pelvis was performed  following the standard protocol without IV contrast. COMPARISON:  April 19, 2020 FINDINGS: Lower chest: There are new trace bilateral pleural effusions, right greater than left with adjacent bibasilar atelectasis.The heart is enlarged. Hepatobiliary: The liver is normal. Normal gallbladder.There is no biliary ductal dilation. Pancreas: Again noted is a small cystic lesion in the pancreatic neck measuring approximately 1.2 cm. There is new mild hazy fat stranding about the pancreas. Spleen: Calcifications are noted in the patient's spleen. Adrenals/Urinary Tract: --Adrenal glands: Unremarkable. --Right kidney/ureter: Again noted is a right renal cyst. --Left kidney/ureter: No hydronephrosis or radiopaque kidney stones. --Urinary bladder: The urinary bladder is significantly distended. There is a left bladder wall diverticulum. Stomach/Bowel: --Stomach/Duodenum: There is a moderate-sized hiatal hernia. --Small bowel: Unremarkable. --Colon: There is an above average amount of stool in the colon. --Appendix: Normal. Vascular/Lymphatic: Atherosclerotic calcification is present within the non-aneurysmal abdominal aorta, without hemodynamically significant stenosis. --No retroperitoneal lymphadenopathy. --No mesenteric lymphadenopathy. --No pelvic or inguinal lymphadenopathy. Reproductive: The patient is status post prior prostatectomy. Other: There is new free fluid in the patient's pericolic gutters and in the presacral region. The patient is status post prior ventral wall hernia repair with mesh. There is a small fat containing umbilical hernia. There are bilateral fat containing inguinal hernias. Musculoskeletal. No acute displaced fractures. IMPRESSION: 1. New mild hazy fat stranding about the pancreas, concerning for acute pancreatitis. Correlation with lipase is recommended. 2. New trace bilateral pleural effusions, right greater than left, with adjacent bibasilar atelectasis. 3. Cardiomegaly. 4. Moderate-sized  hiatal hernia. 5. Distended urinary bladder. 6. New free fluid in the abdomen and pelvis of unknown clinical significance. Aortic Atherosclerosis (ICD10-I70.0). Electronically Signed   By: Constance Holster M.D.   On: 05/18/2020 22:02    EKG: Independently reviewed. Interpretation : Sinus rhythm with first-degree AV block  Assessment/Plan 79 year old male with history of hypertension, gout, GERD, hypothyroidism who presents with a 2- week history of generalized malaise, feeling weak and tired, hot and sweaty, with nausea and poor oral intake for the past 3 days, now with abdominal pain and constipation.  No improvement with empiric outpatient antibiotics for UTI.    Sepsis vs SIRS ([place]) -Patient, recently started on Keflex for suspected UTI, with no fever or tachycardia and no definite source  of infection but with leukocytosis of 20,000 and lactic acid 2.1 -Continue sepsis fluids -Continue broad-spectrum antibiotics for sepsis of unknown source. -UA and CXR unremarkable, so de-escalate if no source of infection -Follow cultures    Generalized weakness   Hyponatremia, symptomatic -Protracted weakness related to hyponatremia and sepsis/SIRS -Sodium 119 and suspect hypovolemic given nausea and decreased oral intake -IV hydration with normal saline with frequent monitoring of serum sodium, with the goal to correct no more than 8 mEq per 24 hours -Serum and urine osmolality and urine sodium -Neurologic checks -Given very low sodium, will monitor in stepdown    AKI (acute kidney injury) ([place]) -Creatinine 2.6, above baseline of 0.8 -IV hydration and monitor.  Avoid nephrotoxins    Acute urinary retention -Patient with distended bladder on CT.  Had a CT on 7/16 at [facility] that was normal  -Patient was only able to void 14 mils in the emergency room and bladder scan showed 722mls -Foley catheter placed -Flomax 0.4 daily -Consider urology consult in the a.m.    Acquired  hypothyroidism -Continue home levothyroxine    Essential hypertension -Continue home    Abdominal pain -Suspect GU etiology, likely related to urinary retention. -CT abdomen and pelvis showing mild hazy fat stranding about the pancreas but lipase normal at 26.  Also shows bladder distention but no other acute findings    DVT prophylaxis: Lovenox  Code Status: full code  Family Communication:  none  Disposition Plan: Back to previous home environment Consults called: none  Status:At the time of admission, it appears that the appropriate admission status for this patient is INPATIENT. This is judged to be reasonable and necessary in order to provide the required intensity of service to ensure the patient's safety given the presenting symptoms, physical exam findings, and initial radiographic and laboratory data in the context of their  Comorbid conditions.   Patient requires inpatient status due to high intensity of service, high risk for further deterioration and high frequency of surveillance required.   I certify that at the point of admission it is my clinical judgment that the patient will require inpatient hospital care spanning beyond [place] MD [facility]     05/19/2020, 12:56 AM

## 2020-05-19 NOTE — Progress Notes (Signed)
PROGRESS NOTE    Arthur Romero  EQA:[id number] DOB: [birth year] DOA: 05/18/2020 PCP: Olin Hauser, DO   Brief Narrative: Arthur Romero is a 79 y.o. male with medical history significant for hypertension, gout, GERD, hypothyroidism. Patient presented secondary to weakness, nausea and abdominal pain and found to have profound hyponatremia in addition to evidence AKI/urinary retention. Patient started on empiric antibiotics for possible infectious etiology.   Assessment & Plan:   Principal Problem:   Sepsis ([place]) Active Problems:   Acquired hypothyroidism   Essential hypertension   Abdominal pain   Hyponatremia   AKI (acute kidney injury) ([place])   Acute urinary retention   Generalized weakness   Possible UTI Patient presented with urinary retention. No other symptoms. Urinalysis unremarkable. No urine culture obtained on admission. Patient does not meet criteria for Sepsis per my review. Started on empiric Vancomycin, Cefepime and Flagyl. Blood cultures obtained -Continue empiric vancomycin, cefepime and flagyl pending blood culture results -Obtain urine culture, hopefully from initially collected urine sample  Symptomatic hyponatremia Patient presented with a sodium of 114 and started on NS IV @ 100 ml/hr. Sodium trending up slowly with slight downtrend this morning. -Continue IV fluids -BMP q4 hours -Urine sodium, osmolality -Add on TSH  AKI Creatinine of 2.60 on admission. Baseline creatinine of 0.84. Improving with IV fluids. Possibly pre-renal with evidence of soft BP/hypotension vs secondary to urinary retention. No hydronephrosis noted on CT abdomen/pelvis -IV fluids  Acute urinary retention Unknown etiology. Foley placed on 7/25.  -Will attempt voiding trial in 24-48 hours  Hypothyroidism -Continue Synthroid  Abdominal pain Likely secondary to urinary retention. Appears to be resolved. Evidence of possible pancreatitis on imaging but no  correlating with physical exam or labs  DVT prophylaxis: Lovenox Code Status:   Code Status: Full Code Family Communication: None at bedside Disposition Plan: Discharge home vs SNF pending continued management of sodium, improvement of AKI and workup for possible infection   Consultants:   None  Procedures:   None  Antimicrobials:  Vancomycin  Cefepime  Flagyl    Subjective: Feeling better than on admission.  Objective: Vitals:   05/19/20 0730 05/19/20 0800 05/19/20 1230 05/19/20 1300  BP: (!) 99/61 (!) 105/57 (!) 99/58 (!) 103/58  Pulse: 90 91 88 82  Resp: 16 20 20 20   Temp:      TempSrc:      SpO2: 90% 91% 92% 95%  Weight:      Height:        Intake/Output Summary (Last 24 hours) at 05/19/2020 1522 Last data filed at 05/19/2020 1447 Gross per 24 hour  Intake 3449.69 ml  Output 40 ml  Net 3409.69 ml   Filed Weights   05/18/20 1750  Weight: 85.3 kg    Examination:  General exam: Appears calm and comfortable Respiratory system: Clear to auscultation. Respiratory effort normal. Cardiovascular system: S1 & S2 heard, RRR. No murmurs, rubs, gallops or clicks. Gastrointestinal system: Abdomen is distended, soft and nontender. No organomegaly or masses felt. Normal bowel sounds heard. Central nervous system: Alert and oriented. No focal neurological deficits. Musculoskeletal: No edema. No calf tenderness Skin: No cyanosis. No rashes Psychiatry: Judgement and insight appear normal. Mood & affect appropriate.     Data Reviewed: I have personally reviewed following labs and imaging studies  CBC Lab Results  Component Value Date   WBC 14.7 (H) 05/19/2020   RBC 4.03 (L) 05/19/2020   HGB 12.8 (L) 05/19/2020   HCT 35.5 (L) 05/19/2020  MCV 88.1 05/19/2020   MCH 31.8 05/19/2020   PLT 227 05/19/2020   MCHC 36.1 (H) 05/19/2020   RDW 12.6 05/19/2020   LYMPHSABS 2,020 03/18/2020   MONOABS 0.8 07/03/2018   EOSABS 230 03/18/2020   BASOSABS 50 03/18/2020      Last metabolic panel Lab Results  Component Value Date   NA 117 (LL) 05/19/2020   K 4.0 05/19/2020   CL 88 (L) 05/19/2020   CO2 20 (L) 05/19/2020   BUN 40 (H) 05/19/2020   CREATININE 1.95 (H) 05/19/2020   GLUCOSE 180 (H) 05/19/2020   GFRNONAA 32 (L) 05/19/2020   GFRAA 38 (L) 05/19/2020   CALCIUM 7.7 (L) 05/19/2020   PROT 7.8 04/19/2020   ALBUMIN 4.5 04/19/2020   BILITOT 0.7 04/19/2020   ALKPHOS 88 04/19/2020   AST 23 04/19/2020   ALT 26 04/19/2020   ANIONGAP 9 05/19/2020    CBG (last 3)  No results for input(s): GLUCAP in the last 72 hours.   GFR: Estimated Creatinine Clearance: 38.5 mL/min (A) (by C-G formula based on SCr of 1.95 mg/dL (H)).  Coagulation Profile: No results for input(s): INR, PROTIME in the last 168 hours.  Recent Results (from the past 240 hour(s))  Blood culture (routine x 2)     Status: None (Preliminary result)   Collection Time: 05/18/20  9:03 PM   Specimen: BLOOD  Result Value Ref Range Status   Specimen Description BLOOD RIGHT ANTECUBITAL  Final   Special Requests   Final    BOTTLES DRAWN AEROBIC AND ANAEROBIC Blood Culture adequate volume   Culture   Final    NO GROWTH < 12 HOURS Performed at [facility], [address] [place], [place] [address]    Report Status PENDING  Incomplete  Blood culture (routine x 2)     Status: None (Preliminary result)   Collection Time: 05/18/20  9:03 PM   Specimen: BLOOD  Result Value Ref Range Status   Specimen Description BLOOD BLOOD RIGHT WRIST  Final   Special Requests   Final    BOTTLES DRAWN AEROBIC AND ANAEROBIC Blood Culture results may not be optimal due to an inadequate volume of blood received in culture bottles   Culture   Final    NO GROWTH < 12 HOURS Performed at [facility], [address], [place], [place] [address]    Report Status PENDING  Incomplete  SARS Coronavirus 2 by RT PCR (hospital order, performed in [place] hospital lab) Nasopharyngeal  Nasopharyngeal Swab     Status: None   Collection Time: 05/18/20  9:28 PM   Specimen: Nasopharyngeal Swab  Result Value Ref Range Status   SARS Coronavirus 2 NEGATIVE NEGATIVE Final    Comment: (NOTE) SARS-CoV-2 target nucleic acids are NOT DETECTED.  The SARS-CoV-2 RNA is generally detectable in upper and lower respiratory specimens during the acute phase of infection. The lowest concentration of SARS-CoV-2 viral copies this assay can detect is 250 copies / mL. A negative result does not preclude SARS-CoV-2 infection and should not be used as the sole basis for treatment or other patient management decisions.  A negative result may occur with improper specimen collection / handling, submission of specimen other than nasopharyngeal swab, presence of viral mutation(s) within the areas targeted by this assay, and inadequate number of viral copies (<250 copies / mL). A negative result must be combined with clinical observations, patient history, and epidemiological information.  Fact Sheet for Patients:   StrictlyIdeas.no  Fact  Sheet for Healthcare Providers: BankingDealers.co.za  This test is not yet approved or  cleared by the [place] and has been authorized for detection and/or diagnosis of SARS-CoV-2 by FDA under an Emergency Use Authorization (EUA).  This EUA will remain in effect (meaning this test can be used) for the duration of the COVID-19 declaration under Section 564(b)(1) of the Act, 21 U.S.C. section 360bbb-3(b)(1), unless the authorization is terminated or revoked sooner.  Performed at [facility], [address], [place], [place] [address]         Radiology Studies: CT ABDOMEN PELVIS WO CONTRAST  Result Date: 05/18/2020 CLINICAL DATA:  Fever. Cough. Nausea x3 days. Constipation x1 week. EXAM: CT ABDOMEN AND PELVIS WITHOUT CONTRAST TECHNIQUE: Multidetector CT imaging of the abdomen and pelvis  was performed following the standard protocol without IV contrast. COMPARISON:  April 19, 2020 FINDINGS: Lower chest: There are new trace bilateral pleural effusions, right greater than left with adjacent bibasilar atelectasis.The heart is enlarged. Hepatobiliary: The liver is normal. Normal gallbladder.There is no biliary ductal dilation. Pancreas: Again noted is a small cystic lesion in the pancreatic neck measuring approximately 1.2 cm. There is new mild hazy fat stranding about the pancreas. Spleen: Calcifications are noted in the patient's spleen. Adrenals/Urinary Tract: --Adrenal glands: Unremarkable. --Right kidney/ureter: Again noted is a right renal cyst. --Left kidney/ureter: No hydronephrosis or radiopaque kidney stones. --Urinary bladder: The urinary bladder is significantly distended. There is a left bladder wall diverticulum. Stomach/Bowel: --Stomach/Duodenum: There is a moderate-sized hiatal hernia. --Small bowel: Unremarkable. --Colon: There is an above average amount of stool in the colon. --Appendix: Normal. Vascular/Lymphatic: Atherosclerotic calcification is present within the non-aneurysmal abdominal aorta, without hemodynamically significant stenosis. --No retroperitoneal lymphadenopathy. --No mesenteric lymphadenopathy. --No pelvic or inguinal lymphadenopathy. Reproductive: The patient is status post prior prostatectomy. Other: There is new free fluid in the patient's pericolic gutters and in the presacral region. The patient is status post prior ventral wall hernia repair with mesh. There is a small fat containing umbilical hernia. There are bilateral fat containing inguinal hernias. Musculoskeletal. No acute displaced fractures. IMPRESSION: 1. New mild hazy fat stranding about the pancreas, concerning for acute pancreatitis. Correlation with lipase is recommended. 2. New trace bilateral pleural effusions, right greater than left, with adjacent bibasilar atelectasis. 3. Cardiomegaly. 4.  Moderate-sized hiatal hernia. 5. Distended urinary bladder. 6. New free fluid in the abdomen and pelvis of unknown clinical significance. Aortic Atherosclerosis (ICD10-I70.0). Electronically Signed   By: Constance Holster M.D.   On: 05/18/2020 22:02   DG Chest Port 1 View  Result Date: 05/19/2020 CLINICAL DATA:  Sepsis. EXAM: PORTABLE CHEST 1 VIEW COMPARISON:  Chest x-ray dated 02/07/2016 FINDINGS: The lung volumes are low. The heart size is enlarged. Bibasilar airspace opacities are noted and are favored to represent atelectasis. There are trace bilateral pleural effusions. There is no acute osseous abnormality. IMPRESSION: 1. Cardiomegaly with trace bilateral pleural effusions. 2. Bibasilar airspace opacities are favored to represent atelectasis. Superimposed infiltrate is not excluded. Electronically Signed   By: Constance Holster M.D.   On: 05/19/2020 01:17        Scheduled Meds: . amLODipine  10 mg Oral Daily  . aspirin  81 mg Oral Daily  . atorvastatin  40 mg Oral Daily  . docusate sodium  100 mg Oral BID  . enoxaparin (LOVENOX) injection  40 mg Subcutaneous Q24H  . levothyroxine  88 mcg Oral QAC breakfast  . pantoprazole  40 mg Oral Daily  .  tamsulosin  0.4 mg Oral Daily   Continuous Infusions: . sodium chloride 100 mL/hr at 05/19/20 1314  . ceFEPime (MAXIPIME) IV Stopped (05/19/20 1447)  . metronidazole Stopped (05/19/20 1313)  . vancomycin 1,250 mg (05/19/20 1454)     LOS: 0 days     Cordelia Poche, MD [facility] 05/19/2020, 3:22 PM  If 7PM-7AM, please contact night-coverage [email or web address]

## 2020-05-19 NOTE — Plan of Care (Signed)

## 2020-05-19 NOTE — Progress Notes (Signed)
Shift summary:  - Patient admitted with low sodium level, low BP. - Weakness upon standing, exam otherwise unremarkable.

## 2020-05-20 ENCOUNTER — Inpatient Hospital Stay: Payer: Medicare PPO

## 2020-05-20 DIAGNOSIS — E440 Moderate protein-calorie malnutrition: Secondary | ICD-10-CM | POA: Insufficient documentation

## 2020-05-20 DIAGNOSIS — E039 Hypothyroidism, unspecified: Secondary | ICD-10-CM

## 2020-05-20 LAB — BASIC METABOLIC PANEL
Anion gap: 6 (ref 5–15)
Anion gap: 6 (ref 5–15)
Anion gap: 9 (ref 5–15)
Anion gap: 9 (ref 5–15)
Anion gap: 9 (ref 5–15)
BUN: 39 mg/dL — ABNORMAL HIGH (ref 8–23)
BUN: 39 mg/dL — ABNORMAL HIGH (ref 8–23)
BUN: 41 mg/dL — ABNORMAL HIGH (ref 8–23)
BUN: 43 mg/dL — ABNORMAL HIGH (ref 8–23)
BUN: 46 mg/dL — ABNORMAL HIGH (ref 8–23)
CO2: 22 mmol/L (ref 22–32)
CO2: 21 mmol/L — ABNORMAL LOW (ref 22–32)
CO2: 20 mmol/L — ABNORMAL LOW (ref 22–32)
CO2: 18 mmol/L — ABNORMAL LOW (ref 22–32)
CO2: 19 mmol/L — ABNORMAL LOW (ref 22–32)
Calcium: 7.3 mg/dL — ABNORMAL LOW (ref 8.9–10.3)
Calcium: 7.5 mg/dL — ABNORMAL LOW (ref 8.9–10.3)
Calcium: 7.6 mg/dL — ABNORMAL LOW (ref 8.9–10.3)
Calcium: 7.8 mg/dL — ABNORMAL LOW (ref 8.9–10.3)
Calcium: 7.9 mg/dL — ABNORMAL LOW (ref 8.9–10.3)
Chloride: 94 mmol/L — ABNORMAL LOW (ref 98–111)
Chloride: 93 mmol/L — ABNORMAL LOW (ref 98–111)
Chloride: 91 mmol/L — ABNORMAL LOW (ref 98–111)
Chloride: 93 mmol/L — ABNORMAL LOW (ref 98–111)
Chloride: 91 mmol/L — ABNORMAL LOW (ref 98–111)
Creatinine, Ser: 1.55 mg/dL — ABNORMAL HIGH (ref 0.61–1.24)
Creatinine, Ser: 1.67 mg/dL — ABNORMAL HIGH (ref 0.61–1.24)
Creatinine, Ser: 1.71 mg/dL — ABNORMAL HIGH (ref 0.61–1.24)
Creatinine, Ser: 1.73 mg/dL — ABNORMAL HIGH (ref 0.61–1.24)
Creatinine, Ser: 1.73 mg/dL — ABNORMAL HIGH (ref 0.61–1.24)
GFR calc Af Amer: 50 mL/min — ABNORMAL LOW (ref >60)
GFR calc Af Amer: 45 mL/min — ABNORMAL LOW (ref >60)
GFR calc Af Amer: 44 mL/min — ABNORMAL LOW (ref >60)
GFR calc Af Amer: 43 mL/min — ABNORMAL LOW (ref >60)
GFR calc Af Amer: 43 mL/min — ABNORMAL LOW (ref >60)
GFR calc non Af Amer: 43 mL/min — ABNORMAL LOW (ref >60)
GFR calc non Af Amer: 39 mL/min — ABNORMAL LOW (ref >60)
GFR calc non Af Amer: 38 mL/min — ABNORMAL LOW (ref >60)
GFR calc non Af Amer: 38 mL/min — ABNORMAL LOW (ref >60)
GFR calc non Af Amer: 38 mL/min — ABNORMAL LOW (ref >60)
Glucose, Bld: 130 mg/dL — ABNORMAL HIGH (ref 70–99)
Glucose, Bld: 150 mg/dL — ABNORMAL HIGH (ref 70–99)
Glucose, Bld: 192 mg/dL — ABNORMAL HIGH (ref 70–99)
Glucose, Bld: 157 mg/dL — ABNORMAL HIGH (ref 70–99)
Glucose, Bld: 182 mg/dL — ABNORMAL HIGH (ref 70–99)
Potassium: 4 mmol/L (ref 3.5–5.1)
Potassium: 4 mmol/L (ref 3.5–5.1)
Potassium: 4.3 mmol/L (ref 3.5–5.1)
Potassium: 4 mmol/L (ref 3.5–5.1)
Potassium: 4.1 mmol/L (ref 3.5–5.1)
Sodium: 122 mmol/L — ABNORMAL LOW (ref 135–145)
Sodium: 120 mmol/L — ABNORMAL LOW (ref 135–145)
Sodium: 120 mmol/L — ABNORMAL LOW (ref 135–145)
Sodium: 120 mmol/L — ABNORMAL LOW (ref 135–145)
Sodium: 119 mmol/L — CL (ref 135–145)

## 2020-05-20 LAB — URINE CULTURE: Culture: NO GROWTH

## 2020-05-20 LAB — SODIUM, URINE, RANDOM: Sodium, Ur: <10 mmol/L

## 2020-05-20 LAB — OSMOLALITY, URINE: Osmolality, Ur: 529 mOsm/kg (ref 300–900)

## 2020-05-20 LAB — PROCALCITONIN: Procalcitonin: 0.17 ng/mL

## 2020-05-20 IMAGING — DX DG ABDOMEN 1V
1 series · 1 of 1 positions shown · non-contrast
Comparison: CT 2 days ago [DATE]

CLINICAL DATA: Abdominal pain.

EXAM:
ABDOMEN - 1 VIEW

[abdomen supine]
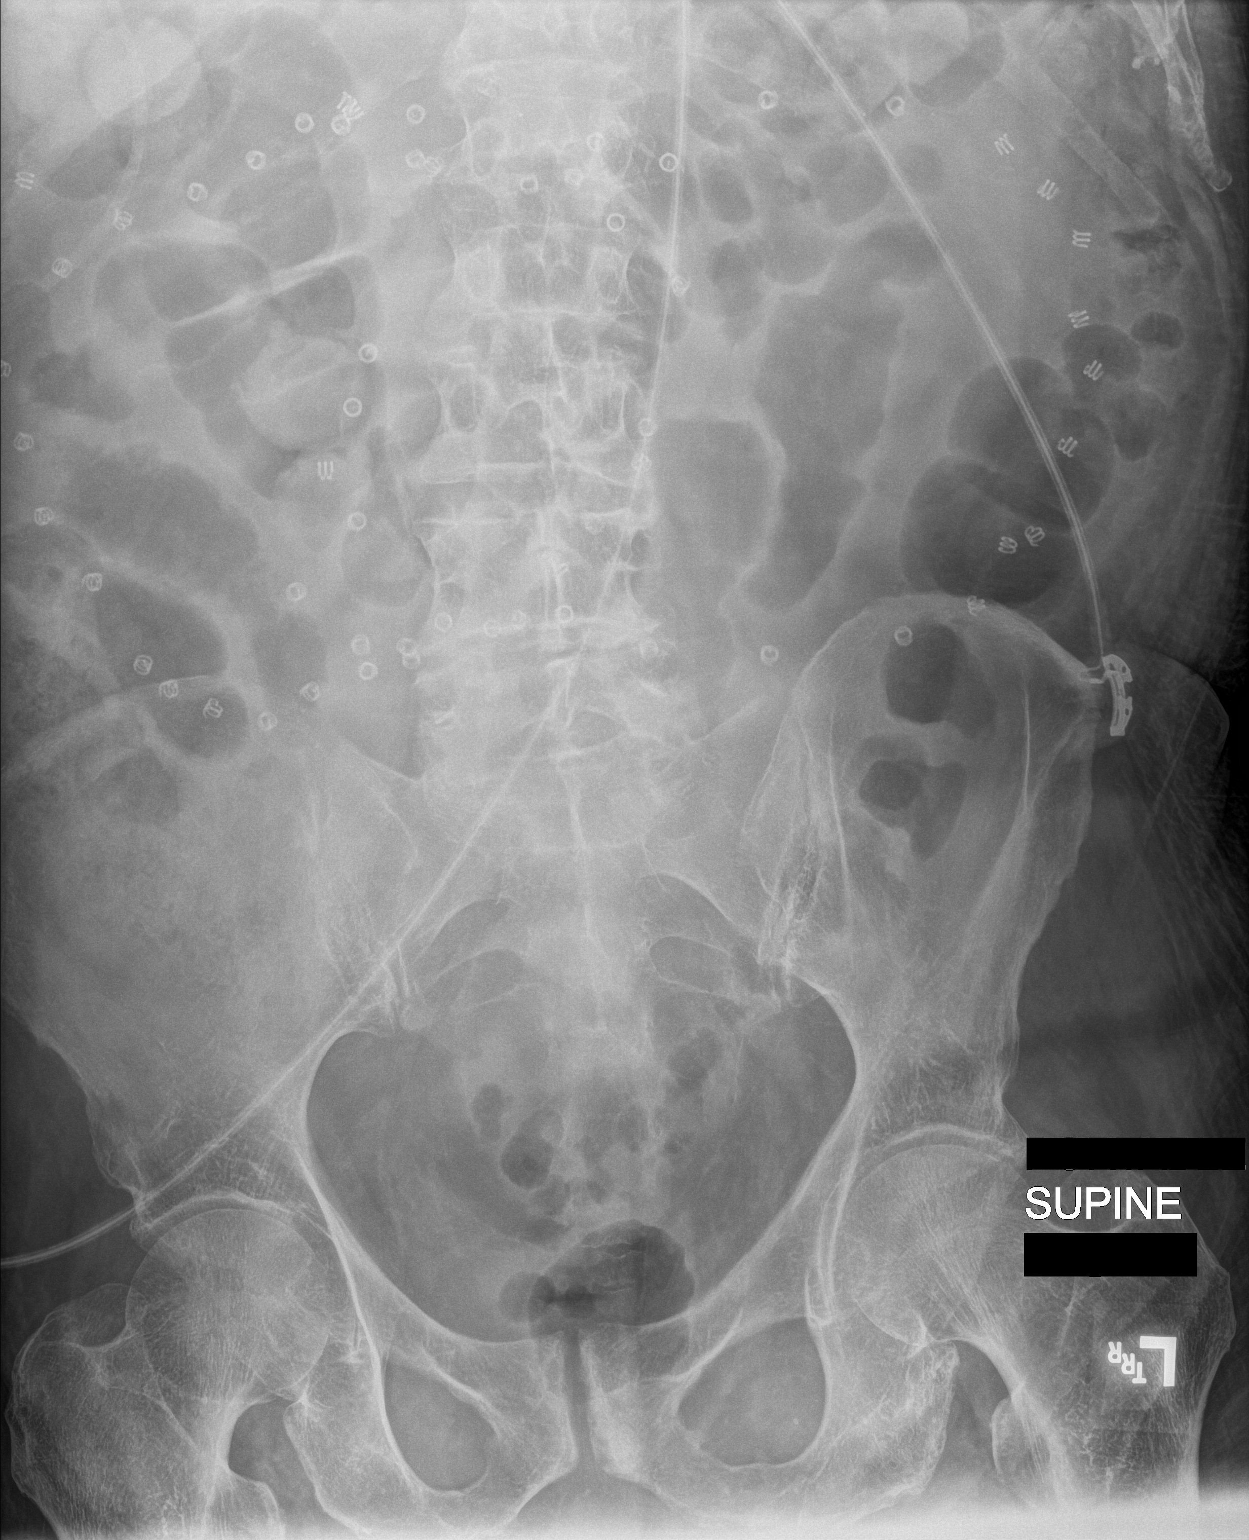

[1 of 1 positions shown; findings below may reference images not displayed]

FINDINGS: Portable supine view of the lower abdomen and pelvis. No evidence of
free intra-abdominal air on this limited view. No bowel dilatation
to suggest obstruction. Small volume of stool in the proximal colon.
Tacks in the anterior abdominal wall from prior hernia repair. No
radiopaque calculi or abnormal soft tissue calcifications. Splenic
granuloma on CT not included in the field of view.
IMPRESSION: No radiographic explanation for abdominal pain on this single
portable supine view.

## 2020-05-20 MED ORDER — SODIUM CHLORIDE 0.9 % IV SOLN: INTRAVENOUS | Status: DC

## 2020-05-20 MED ORDER — ENSURE ENLIVE PO LIQD
237.0000 mL | Freq: Two times a day (BID) | ORAL | Status: DC
  Administered 2020-05-20 – 2020-05-24 (×7): 237 mL via ORAL

## 2020-05-20 MED ORDER — GABAPENTIN 100 MG PO CAPS
200.0000 mg | ORAL_CAPSULE | Freq: Every day | ORAL | Status: DC
  Administered 2020-05-20 – 2020-05-23 (×4): 200 mg via ORAL
  Filled 2020-05-20 (×4): qty 2

## 2020-05-20 MED ORDER — ADULT MULTIVITAMIN W/MINERALS CH
1.0000 | ORAL_TABLET | Freq: Every day | ORAL | Status: DC
  Administered 2020-05-21 – 2020-05-24 (×4): 1 via ORAL
  Filled 2020-05-20 (×4): qty 1

## 2020-05-20 MED ORDER — POLYETHYLENE GLYCOL 3350 17 G PO PACK
17.0000 g | PACK | Freq: Two times a day (BID) | ORAL | Status: DC
  Administered 2020-05-20 – 2020-05-22 (×5): 17 g via ORAL
  Filled 2020-05-20 (×5): qty 1

## 2020-05-20 MED ORDER — FLEET ENEMA 7-19 GM/118ML RE ENEM
1.0000 | ENEMA | Freq: Once | RECTAL | Status: AC
  Administered 2020-05-20: 1 via RECTAL

## 2020-05-20 MED ORDER — MAGNESIUM HYDROXIDE 400 MG/5ML PO SUSP
30.0000 mL | Freq: Once | ORAL | Status: AC
  Administered 2020-05-20: 30 mL via ORAL
  Filled 2020-05-20: qty 30

## 2020-05-20 NOTE — Progress Notes (Signed)
OT Cancellation Note  Patient Details Name: Arthur Romero MRN: [id number] DOB: 01-10-1941   Cancelled Treatment:    Reason Eval/Treat Not Completed: Medical issues which prohibited therapy. Consult received, chart reviewed. Pt noted with updated Na+ labs of 120, falling outside current guidelines for participation in therapy. Will continue to follow acutely and re-attempt at later date/time as pt is medically appropriate.  Jeni Salles, MPH, MS, OTR/L ascom [phone] 05/20/20, 9:57 AM

## 2020-05-20 NOTE — Evaluation (Addendum)
Physical Therapy Evaluation Patient Details Name: Arthur Romero MRN: [id number] DOB: [birth year] Today's Date: 05/20/2020   History of Present Illness  Arthur Romero is a 79 y/o male who was admitted for hyponatremia. Chief complaints include general malaise, feeling weak and tired, hot and sweaty with nausea and poor oral intake x 3 days. Pt now with abdominal pain and constipation. PMH includes HTN, gout, GERD, hypothyroidism, and RA.  Clinical Impression  Pt received in bed and agreeable to PT evaluation. Pt performed bed mobility with HOB elevated with supervision only for safety with lines/leads however required no physical assistance. Pt able to maintain good sitting balance EOB with BLE support on bed. Pt stood with mod A to RW and noted to have posterior LOB several times - pt aware of movement however with noted poor ability to correct. Pt marched in place and ambulated several steps to L/R/F/B with min A for balance. Vitals remained stable throughout session. At this time, pt presents with decreased strength, decreased balance, and decreased endurance. Fatigue is a limiting factor. Recommend skilled PT to address current deficits and STR at discharge to optimize return to PLOF, decreased falls risk, and maximize functional mobility/independence.       Follow Up Recommendations SNF    Equipment Recommendations  Rolling walker with 5" wheels    Recommendations for Other Services       Precautions / Restrictions Precautions Precautions: Fall Restrictions Weight Bearing Restrictions: No      Mobility  Bed Mobility Overal bed mobility: Needs Assistance Bed Mobility: Supine to Sit;Sit to Supine     Supine to sit: Supervision Sit to supine: Supervision   General bed mobility comments: Pt was supervision for supine <> sit transfers for line management, Pt with minimal report of dizziness with position change which subsided quickly.  Transfers Overall transfer level:  Needs assistance Equipment used: Rolling walker (2 wheeled) Transfers: Sit to/from Stand Sit to Stand: Mod assist         General transfer comment: Mod A for sit <> stand transfer for boost into standing, balance while transitioning positions, and for eccentric control on descent.  Ambulation/Gait Ambulation/Gait assistance: Min assist Gait Distance (Feet): 5 Feet Assistive device: Rolling walker (2 wheeled) Gait Pattern/deviations: Decreased step length - right;Decreased step length - left Gait velocity: decreased   General Gait Details: Pt took several steps to L, R, forward, and backward using RW with min A for steadying. Pt with posterior LOB requiring mod A for balance. Noted decreased step lengths bilaterally however may be due to limited ambulation distance.  Stairs            Wheelchair Mobility    Modified Rankin (Stroke Patients Only)       Balance Overall balance assessment: Needs assistance Sitting-balance support: Feet supported Sitting balance-Leahy Scale: Good Sitting balance - Comments: no over LOB while sitting with unsupported back EOB   Standing balance support: Bilateral upper extremity supported;During functional activity Standing balance-Leahy Scale: Fair Standing balance comment: Posterior LOB with BUE on RW requiring min-mod A for balance                             Pertinent Vitals/Pain Pain Assessment: Faces Faces Pain Scale: Hurts little more Pain Location: abdomen Pain Descriptors / Indicators: Discomfort Pain Intervention(s): Monitored during session    Home Living Family/patient expects to be discharged to:: Private residence Living Arrangements: Alone Available Help at Discharge: Family;Friend(s);Available PRN/intermittently (  sisters and landlord) Type of Home: House Home Access: Stairs to enter Entrance Stairs-Rails: Right Entrance Stairs-Number of Steps: 4 Home Layout: One level Home Equipment: None;Grab bars -  tub/shower Additional Comments: Pt reports living alone however he has two sisters and his landlord that could come and check on him from time to time.    Prior Function Level of Independence: Needs assistance   Gait / Transfers Assistance Needed: Pt ambulated with RW and reported havign someone provide a little assistance for balance/steadying (agrees to less than 25%). Pt reports that he was mod I with sit <> stand transfers with RW and independence with bed mobility.     Comments: Pt reported using a RW for ambulation and transfers however when asked if he had any equipment, pt denied having any.; pt also endorses multiple falls over 2 year span     Hand Dominance        Extremity/Trunk Assessment   Upper Extremity Assessment Upper Extremity Assessment: Generalized weakness (grossly 4- to 4/5 bilaterally)    Lower Extremity Assessment Lower Extremity Assessment: Generalized weakness (grossly 4 to 4+/5 bilaterally)    Cervical / Trunk Assessment Cervical / Trunk Assessment: Kyphotic  Communication   Communication: No difficulties  Cognition Arousal/Alertness: Awake/alert Behavior During Therapy: WFL for tasks assessed/performed Overall Cognitive Status: Within Functional Limits for tasks assessed                                 General Comments: Pt A&O x 4 with no hesitation on answers.      General Comments      Exercises Other Exercises Other Exercises: pt performed standing marching x 15 bilaterally; SLR x 5 bilaterally   Assessment/Plan    PT Assessment Patient needs continued PT services  PT Problem List Decreased strength;Decreased activity tolerance;Decreased balance;Decreased mobility;Pain       PT Treatment Interventions DME instruction;Gait training;Stair training;Functional mobility training;Therapeutic activities;Therapeutic exercise;Balance training;Patient/family education    PT Goals (Current goals can be found in the Care Plan  section)  Acute Rehab PT Goals Patient Stated Goal: to get stronger, have no pain, and go home PT Goal Formulation: With patient Time For Goal Achievement: 06/03/20 Potential to Achieve Goals: Good    Frequency Min 2X/week   Barriers to discharge Decreased caregiver support      Co-evaluation               AM-PAC PT "6 Clicks" Mobility  Outcome Measure Help needed turning from your back to your side while in a flat bed without using bedrails?: None Help needed moving from lying on your back to sitting on the side of a flat bed without using bedrails?: A Little Help needed moving to and from a bed to a chair (including a wheelchair)?: A Little Help needed standing up from a chair using your arms (e.g., wheelchair or bedside chair)?: A Lot Help needed to walk in hospital room?: A Little Help needed climbing 3-5 steps with a railing? : A Little 6 Click Score: 18    End of Session Equipment Utilized During Treatment: Gait belt Activity Tolerance: Patient limited by fatigue Patient left: in bed;with call bell/phone within reach Nurse Communication: Mobility status PT Visit Diagnosis: Unsteadiness on feet (R26.81);History of falling (Z91.81);Muscle weakness (generalized) (M62.81);Pain Pain - Right/Left:  (central) Pain - part of body:  (abdomen)    Time: 9833-8250 PT Time Calculation (min) (ACUTE ONLY): 39 min  Charges:              Vale Haven, SPT  Vale Haven 05/20/2020, 3:53 PM

## 2020-05-20 NOTE — Progress Notes (Signed)
Initial Nutrition Assessment  DOCUMENTATION CODES:   Non-severe (moderate) malnutrition in context of chronic illness  INTERVENTION:   Ensure Enlive po BID, each supplement provides 350 kcal and 20 grams of protein  MVI daily   Liberalize diet   Pt at moderate refeed risk; recommend monitor K, Mg and P labs as oral intake improves   NUTRITION DIAGNOSIS:   Moderate Malnutrition related to social / environmental circumstances as evidenced by moderate fat depletion, moderate muscle depletion.  GOAL:   Patient will meet greater than or equal to 90% of their needs  MONITOR:   PO intake, Supplement acceptance, Labs, Weight trends, Skin, I & O's  REASON FOR ASSESSMENT:   Malnutrition Screening Tool    ASSESSMENT:   79 y.o. male with medical history significant for hypertension, gout, GERD, hypothyroidism who presents with UTI, sepsis and hyponatremia  Met with pt in room today. Pt reports poor appetite and oral intake for 1 week pta r/t constipation. Pt reports that he continues to be constipated today. Pt reports nausea and continued poor appetite along with dry heaving. Pt does not have any interest in eating today. RD discussed with pt the importance of adequate nutrition needed to preserve lean muscle. Pt is willing to drink chocolate supplements once he has a BM. RD will add supplements and MVI to help pt meet his estimated needs. RD will also liberalize pt's diet. Pt is at moderate refeed risk. Per chart, pt appears fairly weight stable at baseline. Pt reports his UBW is ~198lbs.   Medications reviewed and include: colace, lovenox, synthroid, protonix, miralax, cefepime, metronidazole, vancomycin   Labs reviewed: Na 120(L), Cl 91(L), BUN 41(H), creat 1.71(H) Wbc- 14.7(H)  NUTRITION - FOCUSED PHYSICAL EXAM:    Most Recent Value  Orbital Region Severe depletion  Upper Arm Region Moderate depletion  Thoracic and Lumbar Region Moderate depletion  Buccal Region Moderate  depletion  Temple Region Moderate depletion  Clavicle Bone Region Mild depletion  Clavicle and Acromion Bone Region Mild depletion  Scapular Bone Region Mild depletion  Dorsal Hand Mild depletion  Patellar Region Moderate depletion  Anterior Thigh Region Moderate depletion  Posterior Calf Region Moderate depletion  Edema (RD Assessment) None  Hair Reviewed  Eyes Reviewed  Mouth Reviewed  Skin Reviewed  Nails Reviewed     Diet Order:   Diet Order            Diet Carb Modified Fluid consistency: Thin; Room service appropriate? Yes  Diet effective now                EDUCATION NEEDS:   Education needs have been addressed  Skin:  Skin Assessment: Reviewed RN Assessment  Last BM:  7/25  Height:   Ht Readings from Last 1 Encounters:  05/19/20 '6\' 3"'$  (1.905 m)    Weight:   Wt Readings from Last 1 Encounters:  05/19/20 89.7 kg    Ideal Body Weight:  89 kg  BMI:  Body mass index is 24.72 kg/m.  Estimated Nutritional Needs:   Kcal:  2300-2600kcal/day  Protein:  115-130g/day  Fluid:  >2.3L/day  Koleen Distance MS, RD, LDN Please refer to [facility] for RD and/or RD on-call/weekend/after hours pager

## 2020-05-20 NOTE — Progress Notes (Signed)
PROGRESS NOTE    Arthur Romero  DPO:[id number] DOB: [birth year] DOA: 05/18/2020 PCP: Olin Hauser, DO   Brief Narrative: Arthur Romero is a 79 y.o. male with medical history significant for hypertension, gout, GERD, hypothyroidism. Patient presented secondary to weakness, nausea and abdominal pain and found to have profound hyponatremia in addition to evidence AKI/urinary retention. Patient started on empiric antibiotics for possible infectious etiology.   Assessment & Plan:   Principal Problem:   Hyponatremia Active Problems:   Acquired hypothyroidism   Essential hypertension   Abdominal pain   AKI (acute kidney injury) ([place])   Acute urinary retention   Generalized weakness   Possible UTI Patient presented with urinary retention. No other symptoms. Urinalysis unremarkable. No urine culture obtained on admission. Patient does not meet criteria for Sepsis per my review. Started on empiric Vancomycin, Cefepime and Flagyl. Blood cultures obtained -Continue empiric vancomycin, cefepime and flagyl pending blood culture results -Obtain urine culture, hopefully from initially collected urine sample  Symptomatic hyponatremia Patient presented with a sodium of 114 and started on NS IV @ 100 ml/hr. Sodium improving slowly. Urine sodium/osmolality suggest SIADH. Clinical presentation suggests hypovolemia. TSH normal. Physical exam euvolemic. -Hold IV fluids tonight -BMP q6 hours  AKI Creatinine of 2.60 on admission. Baseline creatinine of 0.84. Improving with IV fluids initially but now trended up slightly. Possibly pre-renal with evidence of soft BP/hypotension vs secondary to urinary retention. No hydronephrosis noted on CT abdomen/pelvis.  Acute urinary retention Unknown etiology. Foley placed on 7/25.  -Will attempt voiding trial in 24 hours  Hypothyroidism -Continue Synthroid  Abdominal pain Likely secondary to urinary retention. Appears to be resolved.  Evidence of possible pancreatitis on imaging but no correlating with physical exam or labs  Atrial fibrillation New onset. Rate controlled -Consult Cardiology in AM  DVT prophylaxis: Lovenox Code Status:   Code Status: Full Code Family Communication: None at bedside Disposition Plan: Discharge home vs SNF pending continued management of sodium, improvement of AKI and workup for possible infection   Consultants:   None  Procedures:   None  Antimicrobials:  Vancomycin  Cefepime  Flagyl    Subjective: Feels dizzy. Some abdominal pain. No bowel movement. Patient has chronic issues with bowel movements.  Objective: Vitals:   05/20/20 0000 05/20/20 0200 05/20/20 0400 05/20/20 0600  BP: (!) 102/60 (!) 106/57 (!) 95/52 (!) 100/54  Pulse: 93 87 84 80  Resp: 23 22 19 19   Temp: 99 F (37.2 C)  99 F (37.2 C)   TempSrc: Oral  Oral   SpO2: 92% 94% 96% 94%  Weight:      Height:        Intake/Output Summary (Last 24 hours) at 05/20/2020 0751 Last data filed at 05/20/2020 0600 Gross per 24 hour  Intake 4022.38 ml  Output 810 ml  Net 3212.38 ml   Filed Weights   05/18/20 1750 05/19/20 1630  Weight: 85.3 kg 89.7 kg    Examination:  General exam: Appears calm and comfortable Respiratory system: Diminished. Respiratory effort normal. Cardiovascular system: S1 & S2 heard, Irregular rhythm, normal rate. No murmurs, rubs, gallops or clicks. Gastrointestinal system: Abdomen is distended, soft and nontender. No organomegaly or masses felt. Normal bowel sounds heard. Central nervous system: Alert and oriented. No focal neurological deficits. Musculoskeletal: No edema. No calf tenderness Skin: No cyanosis. No rashes Psychiatry: Judgement and insight appear normal. Blunt affect    Data Reviewed: I have personally reviewed following labs and imaging studies  CBC  Lab Results  Component Value Date   WBC 14.7 (H) 05/19/2020   RBC 4.03 (L) 05/19/2020   HGB 12.8 (L)  05/19/2020   HCT 35.5 (L) 05/19/2020   MCV 88.1 05/19/2020   MCH 31.8 05/19/2020   PLT 227 05/19/2020   MCHC 36.1 (H) 05/19/2020   RDW 12.6 05/19/2020   LYMPHSABS 2,020 03/18/2020   MONOABS 0.8 07/03/2018   EOSABS 230 03/18/2020   BASOSABS 50 04/18/7627     Last metabolic panel Lab Results  Component Value Date   NA 122 (L) 05/20/2020   K 4.0 05/20/2020   CL 94 (L) 05/20/2020   CO2 22 05/20/2020   BUN 39 (H) 05/20/2020   CREATININE 1.55 (H) 05/20/2020   GLUCOSE 130 (H) 05/20/2020   GFRNONAA 43 (L) 05/20/2020   GFRAA 50 (L) 05/20/2020   CALCIUM 7.3 (L) 05/20/2020   PROT 7.8 04/19/2020   ALBUMIN 4.5 04/19/2020   BILITOT 0.7 04/19/2020   ALKPHOS 88 04/19/2020   AST 23 04/19/2020   ALT 26 04/19/2020   ANIONGAP 6 05/20/2020    CBG (last 3)  Recent Labs    05/19/20 1630  GLUCAP 177*     GFR: Estimated Creatinine Clearance: 48.5 mL/min (A) (by C-G formula based on SCr of 1.55 mg/dL (H)).  Coagulation Profile: No results for input(s): INR, PROTIME in the last 168 hours.  Recent Results (from the past 240 hour(s))  Blood culture (routine x 2)     Status: None (Preliminary result)   Collection Time: 05/18/20  9:03 PM   Specimen: BLOOD  Result Value Ref Range Status   Specimen Description BLOOD RIGHT ANTECUBITAL  Final   Special Requests   Final    BOTTLES DRAWN AEROBIC AND ANAEROBIC Blood Culture adequate volume   Culture   Final    NO GROWTH 2 DAYS Performed at [facility], [address] [place], [place] [address]    Report Status PENDING  Incomplete  Blood culture (routine x 2)     Status: None (Preliminary result)   Collection Time: 05/18/20  9:03 PM   Specimen: BLOOD  Result Value Ref Range Status   Specimen Description BLOOD BLOOD RIGHT WRIST  Final   Special Requests   Final    BOTTLES DRAWN AEROBIC AND ANAEROBIC Blood Culture results may not be optimal due to an inadequate volume of blood received in culture bottles   Culture   Final     NO GROWTH 2 DAYS Performed at [facility], [address] [place], [place] [address]    Report Status PENDING  Incomplete  SARS Coronavirus 2 by RT PCR (hospital order, performed in [place] hospital lab) Nasopharyngeal Nasopharyngeal Swab     Status: None   Collection Time: 05/18/20  9:28 PM   Specimen: Nasopharyngeal Swab  Result Value Ref Range Status   SARS Coronavirus 2 NEGATIVE NEGATIVE Final    Comment: (NOTE) SARS-CoV-2 target nucleic acids are NOT DETECTED.  The SARS-CoV-2 RNA is generally detectable in upper and lower respiratory specimens during the acute phase of infection. The lowest concentration of SARS-CoV-2 viral copies this assay can detect is 250 copies / mL. A negative result does not preclude SARS-CoV-2 infection and should not be used as the sole basis for treatment or other patient management decisions.  A negative result may occur with improper specimen collection / handling, submission of specimen other than nasopharyngeal swab, presence of viral mutation(s) within the areas targeted by this assay, and inadequate number  of viral copies (<250 copies / mL). A negative result must be combined with clinical observations, patient history, and epidemiological information.  Fact Sheet for Patients:   StrictlyIdeas.no  Fact Sheet for Healthcare Providers: BankingDealers.co.za  This test is not yet approved or  cleared by the [place] FDA and has been authorized for detection and/or diagnosis of SARS-CoV-2 by FDA under an Emergency Use Authorization (EUA).  This EUA will remain in effect (meaning this test can be used) for the duration of the COVID-19 declaration under Section 564(b)(1) of the Act, 21 U.S.C. section 360bbb-3(b)(1), unless the authorization is terminated or revoked sooner.  Performed at [facility], [address] [place], [place] [address]   MRSA PCR Screening      Status: None   Collection Time: 05/19/20  4:31 PM   Specimen: Nasopharyngeal  Result Value Ref Range Status   MRSA by PCR NEGATIVE NEGATIVE Final    Comment:        The GeneXpert MRSA Assay (FDA approved for NASAL specimens only), is one component of a comprehensive MRSA colonization surveillance program. It is not intended to diagnose MRSA infection nor to guide or monitor treatment for MRSA infections. Performed at [facility], [address], [place], [place] [address]         Radiology Studies: CT ABDOMEN PELVIS WO CONTRAST  Result Date: 05/18/2020 CLINICAL DATA:  Fever. Cough. Nausea x3 days. Constipation x1 week. EXAM: CT ABDOMEN AND PELVIS WITHOUT CONTRAST TECHNIQUE: Multidetector CT imaging of the abdomen and pelvis was performed following the standard protocol without IV contrast. COMPARISON:  April 19, 2020 FINDINGS: Lower chest: There are new trace bilateral pleural effusions, right greater than left with adjacent bibasilar atelectasis.The heart is enlarged. Hepatobiliary: The liver is normal. Normal gallbladder.There is no biliary ductal dilation. Pancreas: Again noted is a small cystic lesion in the pancreatic neck measuring approximately 1.2 cm. There is new mild hazy fat stranding about the pancreas. Spleen: Calcifications are noted in the patient's spleen. Adrenals/Urinary Tract: --Adrenal glands: Unremarkable. --Right kidney/ureter: Again noted is a right renal cyst. --Left kidney/ureter: No hydronephrosis or radiopaque kidney stones. --Urinary bladder: The urinary bladder is significantly distended. There is a left bladder wall diverticulum. Stomach/Bowel: --Stomach/Duodenum: There is a moderate-sized hiatal hernia. --Small bowel: Unremarkable. --Colon: There is an above average amount of stool in the colon. --Appendix: Normal. Vascular/Lymphatic: Atherosclerotic calcification is present within the non-aneurysmal abdominal aorta, without hemodynamically  significant stenosis. --No retroperitoneal lymphadenopathy. --No mesenteric lymphadenopathy. --No pelvic or inguinal lymphadenopathy. Reproductive: The patient is status post prior prostatectomy. Other: There is new free fluid in the patient's pericolic gutters and in the presacral region. The patient is status post prior ventral wall hernia repair with mesh. There is a small fat containing umbilical hernia. There are bilateral fat containing inguinal hernias. Musculoskeletal. No acute displaced fractures. IMPRESSION: 1. New mild hazy fat stranding about the pancreas, concerning for acute pancreatitis. Correlation with lipase is recommended. 2. New trace bilateral pleural effusions, right greater than left, with adjacent bibasilar atelectasis. 3. Cardiomegaly. 4. Moderate-sized hiatal hernia. 5. Distended urinary bladder. 6. New free fluid in the abdomen and pelvis of unknown clinical significance. Aortic Atherosclerosis (ICD10-I70.0). Electronically Signed   By: Constance Holster M.D.   On: 05/18/2020 22:02   DG Chest Port 1 View  Result Date: 05/19/2020 CLINICAL DATA:  Sepsis. EXAM: PORTABLE CHEST 1 VIEW COMPARISON:  Chest x-ray dated 02/07/2016 FINDINGS: The lung volumes are low. The heart size is enlarged. Bibasilar  airspace opacities are noted and are favored to represent atelectasis. There are trace bilateral pleural effusions. There is no acute osseous abnormality. IMPRESSION: 1. Cardiomegaly with trace bilateral pleural effusions. 2. Bibasilar airspace opacities are favored to represent atelectasis. Superimposed infiltrate is not excluded. Electronically Signed   By: Constance Holster M.D.   On: 05/19/2020 01:17        Scheduled Meds: . amLODipine  10 mg Oral Daily  . aspirin  81 mg Oral Daily  . atorvastatin  40 mg Oral Daily  . Chlorhexidine Gluconate Cloth  6 each Topical Daily  . docusate sodium  100 mg Oral BID  . enoxaparin (LOVENOX) injection  40 mg Subcutaneous Q24H  . gabapentin   200 mg Oral QHS  . levothyroxine  88 mcg Oral QAC breakfast  . pantoprazole  40 mg Oral Daily  . tamsulosin  0.4 mg Oral Daily   Continuous Infusions: . sodium chloride 100 mL/hr at 05/20/20 0600  . ceFEPime (MAXIPIME) IV Stopped (05/20/20 0124)  . metronidazole Stopped (05/20/20 0405)  . vancomycin Stopped (05/19/20 1624)     LOS: 1 day     Cordelia Poche, MD [facility] 05/20/2020, 7:51 AM  If 7PM-7AM, please contact night-coverage [email or web address]

## 2020-05-20 NOTE — Plan of Care (Signed)
Pt had a fairly good night, VSS, urine out adequate, denies any pain all night. Pt stated " feeling some rales of his LUL", encourage pt deep cough and breathing. Pt was also given IS and educated on how to use it, good effort on the use IS. PT refused for his foley to be taken out this morning, pt was educated about on the urinal infection related to long use of foley. Stated" I will talk to my Dr this morning during rounds"

## 2020-05-20 NOTE — Progress Notes (Signed)
Pharmacy Antibiotic Note  Arthur Romero is a 79 y.o. male admitted on 05/18/2020 with sepsis.  Pharmacy has been consulted for Vancomycin and Cefepime dosing.  Plan: Cefepime 2gm IV q12hrs  Vancomycin 1250 mg IV q24h. Although renal function has improved, SCr now trending back up. Will avoid changing dose at this time with concern for possible continued trend back up.     Height: 6\' 3"  (190.5 cm) Weight: 89.7 kg (197 lb 12 oz) IBW/kg (Calculated) : 84.5  Temp (24hrs), Avg:98.6 F (37 C), Min:97.6 F (36.4 C), Max:99.5 F (37.5 C)  Recent Labs  Lab 05/18/20 1754 05/18/20 1754 05/18/20 2103 05/19/20 0048 05/19/20 0048 05/19/20 0628 05/19/20 0938 05/19/20 1827 05/19/20 2251 05/20/20 0307 05/20/20 0825 05/20/20 1208  WBC 19.8*  --   --  16.9*  --  14.7*  --   --   --   --   --   --   CREATININE 2.60*   < >  --  2.18*   < > 2.00*   < > 1.82* 1.59* 1.55* 1.67* 1.71*  LATICACIDVEN  --   --  2.1* 1.3  --   --   --   --   --   --   --   --    < > = values in this interval not displayed.    Estimated Creatinine Clearance: 43.9 mL/min (A) (by C-G formula based on SCr of 1.71 mg/dL (H)).    Allergies  Allergen Reactions  . Ace Inhibitors Swelling    Lip swelling Lip swelling Lip swelling   . Prednisone Hives    Antimicrobials this admission: Vancomycin 7/25 >> Metronidazole 7/25 >> Cefepime 7/25 >>   Microbiology results: 7/24 BCx: NGTD 7/24 UCx: pending  7/25 MRSA PCR: negative  Thank you for allowing pharmacy to be a part of this patient's care.  Tawnya Crook, PharmD 05/20/2020 2:01 PM

## 2020-05-21 ENCOUNTER — Inpatient Hospital Stay (HOSPITAL_COMMUNITY)
Admit: 2020-05-21 | Discharge: 2020-05-21 | Disposition: A | Discharge status: Home / Self Care | Payer: Medicare PPO | Attending: Cardiovascular Disease | Admitting: Cardiovascular Disease

## 2020-05-21 DIAGNOSIS — E860 Dehydration: Secondary | ICD-10-CM

## 2020-05-21 DIAGNOSIS — R531 Weakness: Secondary | ICD-10-CM

## 2020-05-21 DIAGNOSIS — E440 Moderate protein-calorie malnutrition: Secondary | ICD-10-CM

## 2020-05-21 DIAGNOSIS — E871 Hypo-osmolality and hyponatremia: Secondary | ICD-10-CM

## 2020-05-21 DIAGNOSIS — R1084 Generalized abdominal pain: Secondary | ICD-10-CM

## 2020-05-21 DIAGNOSIS — I480 Paroxysmal atrial fibrillation: Secondary | ICD-10-CM

## 2020-05-21 DIAGNOSIS — I361 Nonrheumatic tricuspid (valve) insufficiency: Secondary | ICD-10-CM

## 2020-05-21 DIAGNOSIS — I340 Nonrheumatic mitral (valve) insufficiency: Secondary | ICD-10-CM

## 2020-05-21 LAB — SODIUM
Sodium: 121 mmol/L — ABNORMAL LOW (ref 135–145)
Sodium: 123 mmol/L — ABNORMAL LOW (ref 135–145)
Sodium: 122 mmol/L — ABNORMAL LOW (ref 135–145)
Sodium: 124 mmol/L — ABNORMAL LOW (ref 135–145)
Sodium: 123 mmol/L — ABNORMAL LOW (ref 135–145)
Sodium: 125 mmol/L — ABNORMAL LOW (ref 135–145)
Sodium: 125 mmol/L — ABNORMAL LOW (ref 135–145)

## 2020-05-21 LAB — CBC
HCT: 33.8 % — ABNORMAL LOW (ref 39.0–52.0)
Hemoglobin: 12.3 g/dL — ABNORMAL LOW (ref 13.0–17.0)
MCH: 32.4 pg (ref 26.0–34.0)
MCHC: 36.4 g/dL — ABNORMAL HIGH (ref 30.0–36.0)
MCV: 88.9 fL (ref 80.0–100.0)
Platelets: 259 10*3/uL (ref 150–400)
RBC: 3.8 MIL/uL — ABNORMAL LOW (ref 4.22–5.81)
RDW: 12.9 % (ref 11.5–15.5)
WBC: 17 10*3/uL — ABNORMAL HIGH (ref 4.0–10.5)
nRBC: 0 % (ref 0.0–0.2)

## 2020-05-21 LAB — BASIC METABOLIC PANEL
Anion gap: 9 (ref 5–15)
BUN: 44 mg/dL — ABNORMAL HIGH (ref 8–23)
CO2: 17 mmol/L — ABNORMAL LOW (ref 22–32)
Calcium: 7.6 mg/dL — ABNORMAL LOW (ref 8.9–10.3)
Chloride: 98 mmol/L (ref 98–111)
Creatinine, Ser: 1.59 mg/dL — ABNORMAL HIGH (ref 0.61–1.24)
GFR calc Af Amer: 48 mL/min — ABNORMAL LOW (ref >60)
GFR calc non Af Amer: 42 mL/min — ABNORMAL LOW (ref >60)
Glucose, Bld: 140 mg/dL — ABNORMAL HIGH (ref 70–99)
Potassium: 4 mmol/L (ref 3.5–5.1)
Sodium: 124 mmol/L — ABNORMAL LOW (ref 135–145)

## 2020-05-21 LAB — OSMOLALITY, URINE: Osmolality, Ur: 531 mOsm/kg (ref 300–900)

## 2020-05-21 LAB — OSMOLALITY: Osmolality: 267 mOsm/kg — ABNORMAL LOW (ref 275–295)

## 2020-05-21 LAB — PROCALCITONIN: Procalcitonin: 0.24 ng/mL

## 2020-05-21 LAB — SODIUM, URINE, RANDOM: Sodium, Ur: <10 mmol/L

## 2020-05-21 MED ORDER — SODIUM CHLORIDE 3 % IV SOLN
INTRAVENOUS | Status: AC
  Filled 2020-05-21 (×2): qty 500

## 2020-05-21 MED ORDER — LACTULOSE 10 GM/15ML PO SOLN: 20.0000 g | Freq: Two times a day (BID) | ORAL | Status: DC | PRN

## 2020-05-21 MED ORDER — SODIUM CHLORIDE 3 % IV SOLN
INTRAVENOUS | Status: DC
  Filled 2020-05-21 (×2): qty 500

## 2020-05-21 MED ORDER — SODIUM CHLORIDE 3 % IV SOLN: INTRAVENOUS | Status: DC

## 2020-05-21 MED ORDER — LACTULOSE 10 GM/15ML PO SOLN
20.0000 g | Freq: Two times a day (BID) | ORAL | Status: DC
  Administered 2020-05-22 (×2): 20 g via ORAL
  Filled 2020-05-21 (×2): qty 30

## 2020-05-21 MED ORDER — LACTULOSE 10 GM/15ML PO SOLN
20.0000 g | Freq: Once | ORAL | Status: AC
  Administered 2020-05-21: 20 g via ORAL
  Filled 2020-05-21: qty 30

## 2020-05-21 MED ORDER — RIVAROXABAN 15 MG PO TABS
15.0000 mg | ORAL_TABLET | Freq: Every day | ORAL | Status: DC
  Administered 2020-05-21 – 2020-05-23 (×3): 15 mg via ORAL
  Filled 2020-05-21 (×4): qty 1

## 2020-05-21 NOTE — Plan of Care (Signed)

## 2020-05-21 NOTE — Progress Notes (Signed)
OT Cancellation Note  Patient Details Name: Arthur Romero MRN: [id number] DOB: [birth year]   Cancelled Treatment:    Reason Eval/Treat Not Completed: Patient at procedure or test/ unavailable. Pt having echocardiogram performed, unavailable at this time for OT evaluation. Will re-attempt at later date/time as pt is available and medically appropriate.  Jeni Salles, MPH, MS, OTR/L ascom [phone] 05/21/20, 11:16 AM

## 2020-05-21 NOTE — Progress Notes (Signed)
[place] for 3% saline Indication: hyponatremia   Assessment: Patient w/ h/o HTN, hypothyroidism, RA, GERD admitted for weakness w/ nausea and abdominal pain found to be in hyponatremia w/ an initially Na of 116 mEq/L. Patient is being started on 3% saline for further correction of sodium.   Goal of Therapy:  Na 135 - 145 w/ a rise no more than 8 to 12 mEq/L in 24 hours.  Plan:  Patient started on hypertonic saline at 30 ml/hr overnight. Sodium starting to trend up but then decreased back down to 122. Hypertonic saline increased to 40 ml/hr around 1000. Sodium now up to 124. Will continue to monitor with q4hr Na checks.  Dorena Bodo, PharmD Clinical Pharmacist 05/21/2020,1:46 PM

## 2020-05-21 NOTE — Consult Note (Signed)
[address] [place], [place] [address] Phone [phone]. Fax [phone]  Date: 05/21/2020                  Patient Name:  Arthur Romero  MRN: [id number]  DOB: [birth year]  Age / Sex: 79 y.o., male         PCP: Olin Hauser, DO                 Service Requesting Consult: IM/ Mariel Aloe, MD                 Reason for Consult: ARF            History of Present Illness: Patient is a 79 y.o. male with medical problems of HTN, Gout, GERD, hypothyroidism, rheumatoid arthritis, who was admitted to [facility] on 05/18/2020 for Dehydration [E86.0] Hyponatremia [E87.1] Weakness [R53.1] Abdominal pain [R10.9] Sepsis ([place]) [A41.9]  Nephrology consult for Low Sodium  Brought  to ER by neighbor for weakness, fever, cough, nausea for 3 days prior. Fall 1 day prior to admission.  Started on 3% hypertonic saline last night with improvement of Na to 124 from 119  Patient also receiving large volume iv fluids for antibiotics  Patient's main concern is severe constipation.  He has been nauseous although he has not vomited.  No diarrhea. He has had decreased appetite for the last several days and has been primarily drinking water     Medications: Outpatient medications: Medications Prior to Admission  Medication Sig Dispense Refill Last Dose  . allopurinol (ZYLOPRIM) 300 MG tablet Take 300 mg by mouth daily.   05/15/2020 at 0730  . amLODipine (NORVASC) 10 MG tablet Take 1 tablet (10 mg total) by mouth daily. 90 tablet 1 05/18/2020 at 0730  . aspirin 81 MG chewable tablet Chew 81 mg by mouth daily.   PRN at PRN  . atorvastatin (LIPITOR) 40 MG tablet Take 1 tablet (40 mg total) by mouth daily. 90 tablet 1 05/17/2020 at 1930  . cephALEXin (KEFLEX) 500 MG capsule Take 1 capsule (500 mg total) by mouth 3 (three) times daily. For 7 days 21 capsule 0 05/18/2020 at 0730  . gabapentin (NEURONTIN) 100 MG capsule Start 1 capsule daily at bed time, increase by 1 cap every 2-3  days as tolerated up to take 3 at once in evening. (Patient taking differently: Take 200 mg by mouth at bedtime. ) 270 capsule 1 05/17/2020 at 1930  . levothyroxine (SYNTHROID) 88 MCG tablet Take 1 tablet (88 mcg total) by mouth daily. 90 tablet 3 05/18/2020 at 0600  . omeprazole (PRILOSEC) 20 MG capsule Take 1 capsule (20 mg total) by mouth daily. 90 capsule 1 05/17/2020 at 1930  . ondansetron (ZOFRAN ODT) 4 MG disintegrating tablet Take 1 tablet (4 mg total) by mouth every 8 (eight) hours as needed for nausea or vomiting. 30 tablet 0 05/18/2020 at PRN  . acetaminophen (TYLENOL) 500 MG tablet Take by mouth.   PRN at PRN    Current medications: Current Facility-Administered Medications  Medication Dose Route Frequency Provider Last Rate Last Admin  . acetaminophen (TYLENOL) tablet 650 mg  650 mg Oral Q6H PRN Athena Masse, MD   650 mg at 05/19/20 0112   Or  . acetaminophen (TYLENOL) suppository 650 mg  650 mg Rectal Q6H PRN Athena Masse, MD      . amLODipine (NORVASC) tablet 10 mg  10 mg Oral Daily Athena Masse, MD   10  mg at 05/20/20 1009  . aspirin chewable tablet 81 mg  81 mg Oral Daily Athena Masse, MD   81 mg at 05/20/20 1008  . atorvastatin (LIPITOR) tablet 40 mg  40 mg Oral Daily Athena Masse, MD   40 mg at 05/20/20 1009  . Chlorhexidine Gluconate Cloth 2 % PADS 6 each  6 each Topical Daily Mariel Aloe, MD   6 each at 05/20/20 1707  . docusate sodium (COLACE) capsule 100 mg  100 mg Oral BID Athena Masse, MD   100 mg at 05/20/20 2115  . enoxaparin (LOVENOX) injection 40 mg  40 mg Subcutaneous Q24H Hall, Scott A, RPH   40 mg at 05/20/20 1008  . feeding supplement (ENSURE ENLIVE) (ENSURE ENLIVE) liquid 237 mL  237 mL Oral BID BM Mariel Aloe, MD   237 mL at 05/20/20 1429  . gabapentin (NEURONTIN) capsule 200 mg  200 mg Oral QHS Mariel Aloe, MD   200 mg at 05/20/20 2115  . HYDROcodone-acetaminophen (NORCO/VICODIN) 5-325 MG per tablet 1-2 tablet  1-2 tablet Oral Q4H PRN  Athena Masse, MD      . lactulose (CHRONULAC) 10 GM/15ML solution 20 g  20 g Oral Once Mariel Aloe, MD      . levothyroxine (SYNTHROID) tablet 88 mcg  88 mcg Oral QAC breakfast Athena Masse, MD   88 mcg at 05/21/20 0516  . morphine 2 MG/ML injection 2 mg  2 mg Intravenous Q2H PRN Athena Masse, MD      . multivitamin with minerals tablet 1 tablet  1 tablet Oral Daily Mariel Aloe, MD      . ondansetron [facility]) tablet 4 mg  4 mg Oral Q6H PRN Athena Masse, MD       Or  . ondansetron [facility]) injection 4 mg  4 mg Intravenous Q6H PRN Athena Masse, MD   4 mg at 05/20/20 1011  . pantoprazole (PROTONIX) EC tablet 40 mg  40 mg Oral Daily Athena Masse, MD   40 mg at 05/20/20 1009  . polyethylene glycol (MIRALAX / GLYCOLAX) packet 17 g  17 g Oral BID Mariel Aloe, MD   17 g at 05/20/20 2115  . sodium chloride (hypertonic) 3 % solution   Intravenous Continuous Lang Snow, NP 30 mL/hr at 05/21/20 0744 Rate Verify at 05/21/20 0744  . tamsulosin (FLOMAX) capsule 0.4 mg  0.4 mg Oral Daily Judd Gaudier V, MD   0.4 mg at 05/20/20 1009      Allergies: Allergies  Allergen Reactions  . Ace Inhibitors Swelling    Lip swelling Lip swelling Lip swelling   . Prednisone Hives      Past Medical History: Past Medical History:  Diagnosis Date  . Arthritis   . GERD (gastroesophageal reflux disease)   . Hypertension   . Rheumatoid arthritis ([place])   . Thyroid disease      Past Surgical History: Past Surgical History:  Procedure Laterality Date  . CARDIAC SURGERY    . SHOULDER SURGERY Right 2016  . TYMPANOPLASTY Right 1987  . vocal cords  1989   polyps     Family History: Family History  Problem Relation Age of Onset  . Hypertension Mother   . Osteoarthritis Mother   . Parkinson's disease Mother   . Heart attack Father 47  . Tuberculosis Father   . Stroke Brother   . Diabetes Brother      Social  History: Social History   Socioeconomic History   . Marital status: Single    Spouse name: Not on file  . Number of children: Not on file  . Years of education: high school  . Highest education level: High school graduate  Occupational History  . Not on file  Tobacco Use  . Smoking status: Never Smoker  . Smokeless tobacco: Never Used  Vaping Use  . Vaping Use: Never used  Substance and Sexual Activity  . Alcohol use: Never    Alcohol/week: 1.0 standard drink    Types: 1 Glasses of wine per week  . Drug use: Never  . Sexual activity: Not on file  Other Topics Concern  . Not on file  Social History Narrative  . Not on file   Social Determinants of Health   Financial Resource Strain:   . Difficulty of Paying Living Expenses:   Food Insecurity:   . Worried About Charity fundraiser in the Last Year:   . Arboriculturist in the Last Year:   Transportation Needs:   . Film/video editor (Medical):   Marland Kitchen Lack of Transportation (Non-Medical):   Physical Activity:   . Days of Exercise per Week:   . Minutes of Exercise per Session:   Stress:   . Feeling of Stress :   Social Connections:   . Frequency of Communication with Friends and Family:   . Frequency of Social Gatherings with Friends and Family:   . Attends Religious Services:   . Active Member of Clubs or Organizations:   . Attends Archivist Meetings:   Marland Kitchen Marital Status:   Intimate Partner Violence:   . Fear of Current or Ex-Partner:   . Emotionally Abused:   Marland Kitchen Physically Abused:   . Sexually Abused:      Review of Systems: Gen: No fevers or chills HEENT: No vision or hearing complaints CV: No chest pain or shortness of breath Resp: No cough or sputum production GI: Severe constipation.  Denies any blood in the stool GU : No blood in the urine MS: No complaints Derm:    No complaints Psych: No complaints Heme: No complaints Neuro: No complaints Endocrine.  No complaints  Vital Signs: Blood pressure (!) 105/47, pulse 83, temperature 98.4  F (36.9 C), temperature source Oral, resp. rate (!) 24, height 6\' 3"  (1.905 m), weight 89.7 kg, SpO2 95 %.   Intake/Output Summary (Last 24 hours) at 05/21/2020 0834 Last data filed at 05/21/2020 0744 Gross per 24 hour  Intake 2080.38 ml  Output 780 ml  Net 1300.38 ml    Weight trends: Filed Weights   05/18/20 1750 05/19/20 1630  Weight: 85.3 kg 89.7 kg    Physical Exam: General:  No acute distress, laying in the bed  HEENT  anicteric, moist oral mucous membranes  Neck:  Supple, no JVD, no masses  Lungs:  Normal breathing effort, clear to auscultation  Heart::  No rub or gallop  Abdomen:  Soft, nontender  Extremities:  Trace edema  Neurologic:  Alert, able to answer questions  Skin:  Warm, dry       Lab results: Basic Metabolic Panel: Recent Labs  Lab 05/20/20 1744 05/20/20 1744 05/20/20 2302 05/20/20 2302 05/21/20 0121 05/21/20 0318 05/21/20 0457  NA 120*   < > 119*   < > 121* 123* 124*  K 4.0  --  4.1  --   --   --  4.0  CL 93*  --  91*  --   --   --  98  CO2 18*  --  19*  --   --   --  17*  GLUCOSE 157*  --  182*  --   --   --  140*  BUN 43*  --  46*  --   --   --  44*  CREATININE 1.73*  --  1.73*  --   --   --  1.59*  CALCIUM 7.8*  --  7.9*  --   --   --  7.6*   < > = values in this interval not displayed.    Liver Function Tests: No results for input(s): AST, ALT, ALKPHOS, BILITOT, PROT, ALBUMIN in the last 168 hours. Recent Labs  Lab 05/18/20 1754  LIPASE 29   No results for input(s): AMMONIA in the last 168 hours.  CBC: Recent Labs  Lab 05/19/20 0048 05/19/20 0628  WBC 16.9* 14.7*  HGB 12.9* 12.8*  HCT 35.9* 35.5*  MCV 88.6 88.1  PLT 252 227    Cardiac Enzymes: No results for input(s): CKTOTAL, TROPONINI in the last 168 hours.  BNP: Invalid input(s): POCBNP  CBG: Recent Labs  Lab 05/19/20 1630  GLUCAP 177*    Microbiology: Recent Results (from the past 720 hour(s))  Urine Culture     Status: None   Collection Time:  05/18/20  6:47 PM   Specimen: Urine, Random  Result Value Ref Range Status   Specimen Description   Final    URINE, RANDOM Performed at [facility], [address], [place], [place] [address]    Special Requests   Final    NONE Performed at [facility], [address], [place], [place] [address]    Culture   Final    NO GROWTH Performed at [facility], [place] [address], [place], [place] [address]    Report Status 05/20/2020 FINAL  Final  Blood culture (routine x 2)     Status: None (Preliminary result)   Collection Time: 05/18/20  9:03 PM   Specimen: BLOOD  Result Value Ref Range Status   Specimen Description BLOOD RIGHT ANTECUBITAL  Final   Special Requests   Final    BOTTLES DRAWN AEROBIC AND ANAEROBIC Blood Culture adequate volume   Culture   Final    NO GROWTH 3 DAYS Performed at [facility], [address] [place], [place] [address]    Report Status PENDING  Incomplete  Blood culture (routine x 2)     Status: None (Preliminary result)   Collection Time: 05/18/20  9:03 PM   Specimen: BLOOD  Result Value Ref Range Status   Specimen Description BLOOD BLOOD RIGHT WRIST  Final   Special Requests   Final    BOTTLES DRAWN AEROBIC AND ANAEROBIC Blood Culture results may not be optimal due to an inadequate volume of blood received in culture bottles   Culture   Final    NO GROWTH 3 DAYS Performed at [facility], [address] [place], [place] [address]    Report Status PENDING  Incomplete  SARS Coronavirus 2 by RT PCR (hospital order, performed in [place] hospital lab) Nasopharyngeal Nasopharyngeal Swab     Status: None   Collection Time: 05/18/20  9:28 PM   Specimen: Nasopharyngeal Swab  Result Value Ref Range Status   SARS Coronavirus 2 NEGATIVE NEGATIVE Final    Comment: (NOTE) SARS-CoV-2 target nucleic acids are NOT DETECTED.  The SARS-CoV-2  RNA is generally detectable in upper and lower respiratory specimens  during the acute phase of infection. The lowest concentration of SARS-CoV-2 viral copies this assay can detect is 250 copies / mL. A negative result does not preclude SARS-CoV-2 infection and should not be used as the sole basis for treatment or other patient management decisions.  A negative result may occur with improper specimen collection / handling, submission of specimen other than nasopharyngeal swab, presence of viral mutation(s) within the areas targeted by this assay, and inadequate number of viral copies (<250 copies / mL). A negative result must be combined with clinical observations, patient history, and epidemiological information.  Fact Sheet for Patients:   StrictlyIdeas.no  Fact Sheet for Healthcare Providers: BankingDealers.co.za  This test is not yet approved or  cleared by the [place] FDA and has been authorized for detection and/or diagnosis of SARS-CoV-2 by FDA under an Emergency Use Authorization (EUA).  This EUA will remain in effect (meaning this test can be used) for the duration of the COVID-19 declaration under Section 564(b)(1) of the Act, 21 U.S.C. section 360bbb-3(b)(1), unless the authorization is terminated or revoked sooner.  Performed at [facility], [address] [place], [place] [address]   MRSA PCR Screening     Status: None   Collection Time: 05/19/20  4:31 PM   Specimen: Nasopharyngeal  Result Value Ref Range Status   MRSA by PCR NEGATIVE NEGATIVE Final    Comment:        The GeneXpert MRSA Assay (FDA approved for NASAL specimens only), is one component of a comprehensive MRSA colonization surveillance program. It is not intended to diagnose MRSA infection nor to guide or monitor treatment for MRSA infections. Performed at [facility], [address] [place], [place] [address]      Coagulation Studies: No results for input(s): LABPROT, INR in the last 72  hours.  Urinalysis: Recent Labs    05/18/20 1847  COLORURINE YELLOW*  LABSPEC 1.016  PHURINE 5.0  GLUCOSEU NEGATIVE  HGBUR NEGATIVE  BILIRUBINUR NEGATIVE  KETONESUR NEGATIVE  PROTEINUR NEGATIVE  NITRITE NEGATIVE  LEUKOCYTESUR NEGATIVE        Imaging: DG Abd 1 View  Result Date: 05/20/2020 CLINICAL DATA:  Abdominal pain. EXAM: ABDOMEN - 1 VIEW COMPARISON:  CT 2 days ago 05/18/2020 FINDINGS: Portable supine view of the lower abdomen and pelvis. No evidence of free intra-abdominal air on this limited view. No bowel dilatation to suggest obstruction. Small volume of stool in the proximal colon. Tacks in the anterior abdominal wall from prior hernia repair. No radiopaque calculi or abnormal soft tissue calcifications. Splenic granuloma on CT not included in the field of view. IMPRESSION: No radiographic explanation for abdominal pain on this single portable supine view. Electronically Signed   By: Keith Rake M.D.   On: 05/20/2020 19:47      Assessment & Plan: Pt is a 79 y.o.  male with HTN, Gout, GERD, hypothyroidism, rheumatoid arthritis, was admitted on 05/18/2020 with Dehydration [E86.0] Hyponatremia [E87.1] Weakness [R53.1] Abdominal pain [R10.9] Sepsis ([place]) [A41.9]  # Hyponatremia #Acute kidney injury   Baseline creatinine of 0.84 from April 26, 2020 Admission creatinine of 2.60 which has slowly improved to 1.59 today Serum sodium level is also slowly improving at 122-124  AKI and hyponatremia likely secondary to volume depletion.  Patient has been drinking only water.  He has had decreased oral intake due to loss of appetite and nausea from constipation  Currently being treated with  3% hypertonic saline Serum sodium is being monitored closely Aggressive bowel regimen is being implemented for We will continue current management for now.  We will see how he responds once constipation and nausea are relieved and he is able to eat her normal diet. We will  follow      LOS: 2 Jaylissa Felty Candiss Norse 7/27/20218:34 AM    Note: This note was prepared with Dragon dictation. Any transcription errors are unintentional

## 2020-05-21 NOTE — NC FL2 (Signed)
[place] LEVEL OF CARE SCREENING TOOL     IDENTIFICATION  Patient Name: Arthur Romero Birthdate: [birth year] Sex: male Admission Date (Current Location): 05/18/2020  [place] and [place] Number:  Engineering geologist and Address:  [facility] [address] [place], [place] [address]      Provider Number: [id number]  Attending Physician Name and Address:  Mariel Aloe, MD  Relative Name and Phone Number:       Current Level of Care: Hospital Recommended Level of Care: [place] Prior Approval Number:    Date Approved/Denied:   PASRR Number: [id number]  Discharge Plan: SNF    Current Diagnoses: Patient Active Problem List   Diagnosis Date Noted  . Malnutrition of moderate degree 05/20/2020  . Abdominal pain 05/19/2020  . Hyponatremia 05/19/2020  . AKI (acute kidney injury) ([place]) 05/19/2020  . Acute urinary retention 05/19/2020  . Generalized weakness 05/19/2020  . Aortic atherosclerosis ([place]) 04/26/2020  . Foot pain, bilateral 04/26/2020  . Acquired hypothyroidism 03/15/2020  . Essential hypertension 03/15/2020  . 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 Mixed hyperlipidemia 03/15/2020  . Elevated hemoglobin A1c 03/15/2020  . Mononeuropathy 03/15/2020    Orientation RESPIRATION BLADDER Height & Weight     Self, Time, Situation, Place  O2 (Nasal Canula 2 L) Continent, Indwelling catheter Weight: 197 lb 12 oz (89.7 kg) Height:  6\' 3"  (190.5 cm)  BEHAVIORAL SYMPTOMS/MOOD NEUROLOGICAL BOWEL NUTRITION STATUS   (None)  (None) Continent Diet  AMBULATORY STATUS COMMUNICATION OF NEEDS Skin   Limited Assist Verbally Normal                       Personal Care Assistance Level of Assistance  Bathing, Feeding, Dressing Bathing Assistance: Limited assistance Feeding assistance: Limited assistance Dressing Assistance: Limited assistance     Functional Limitations Info  Sight, Hearing, Speech  Sight Info: Adequate Hearing Info: Adequate Speech Info: Adequate    SPECIAL CARE FACTORS FREQUENCY  PT (By licensed PT), OT (By licensed OT)     PT Frequency: 5 x week OT Frequency: 5 x week            Contractures Contractures Info: Not present    Additional Factors Info  Code Status, Allergies Code Status Info: Full code Allergies Info: Ace Inhibitors, Prednisone           Current Medications (05/21/2020):  This is the current hospital active medication list Current Facility-Administered Medications  Medication Dose Route Frequency Provider Last Rate Last Admin  . acetaminophen (TYLENOL) tablet 650 mg  650 mg Oral Q6H PRN Athena Masse, MD   650 mg at 05/19/20 0112   Or  . acetaminophen (TYLENOL) suppository 650 mg  650 mg Rectal Q6H PRN Athena Masse, MD      . atorvastatin (LIPITOR) tablet 40 mg  40 mg Oral Daily Athena Masse, MD   40 mg at 05/21/20 0923  . Chlorhexidine Gluconate Cloth 2 % PADS 6 each  6 each Topical Daily Mariel Aloe, MD   6 each at 05/20/20 1707  . docusate sodium (COLACE) capsule 100 mg  100 mg Oral BID Athena Masse, MD   100 mg at 05/21/20 1007  . feeding supplement (ENSURE ENLIVE) (ENSURE ENLIVE) liquid 237 mL  237 mL Oral BID BM Mariel Aloe, MD   237 mL at 05/21/20 0924  . gabapentin (NEURONTIN) capsule 200 mg  200 mg Oral QHS [place],  Evalee Jefferson, MD   200 mg at 05/20/20 2115  . HYDROcodone-acetaminophen (NORCO/VICODIN) 5-325 MG per tablet 1-2 tablet  1-2 tablet Oral Q4H PRN Athena Masse, MD      . levothyroxine (SYNTHROID) tablet 88 mcg  88 mcg Oral QAC breakfast Athena Masse, MD   88 mcg at 05/21/20 0516  . morphine 2 MG/ML injection 2 mg  2 mg Intravenous Q2H PRN Athena Masse, MD      . multivitamin with minerals tablet 1 tablet  1 tablet Oral Daily Mariel Aloe, MD   1 tablet at 05/21/20 [phone]  . ondansetron (ZOFRAN) tablet 4 mg  4 mg Oral Q6H PRN Athena Masse, MD       Or  . ondansetron [facility]) injection 4 mg  4  mg Intravenous Q6H PRN Athena Masse, MD   4 mg at 05/21/20 0901  . pantoprazole (PROTONIX) EC tablet 40 mg  40 mg Oral Daily Athena Masse, MD   40 mg at 05/21/20 0923  . polyethylene glycol (MIRALAX / GLYCOLAX) packet 17 g  17 g Oral BID Mariel Aloe, MD   17 g at 05/21/20 0923  . Rivaroxaban (XARELTO) tablet 15 mg  15 mg Oral Q supper Dunn, Ryan M, PA-C      . sodium chloride (hypertonic) 3 % solution   Intravenous Continuous Murlean Iba, MD 40 mL/hr at 05/21/20 0953 40 mL/hr at 05/21/20 0953  . tamsulosin (FLOMAX) capsule 0.4 mg  0.4 mg Oral Daily Athena Masse, MD   0.4 mg at 05/21/20 5409     Discharge Medications: Please see discharge summary for a list of discharge medications.  Relevant Imaging Results:  Relevant Lab Results:   Additional Information SS#: [id number]  Candie Chroman, LCSW

## 2020-05-21 NOTE — Progress Notes (Signed)
MEDICATION RELATED CONSULT NOTE - INITIAL   Pharmacy Consult for 3% saline Indication: hyponatremia  Allergies  Allergen Reactions  . Ace Inhibitors Swelling    Lip swelling Lip swelling Lip swelling   . Prednisone Hives    Patient Measurements: Height: 6\' 3"  (190.5 cm) Weight: 89.7 kg (197 lb 12 oz) IBW/kg (Calculated) : 84.5  Vital Signs: Temp: 98.4 F (36.9 C) (07/26 1900) Temp Source: Oral (07/26 1900) BP: 115/57 (07/26 2000) Pulse Rate: 89 (07/26 2000) Intake/Output from previous day: 07/26 0701 - 07/27 0700 In: 2007.3 [P.O.:720; I.V.:1187.3; IV Piggyback:100] Out: 415 [Urine:415] Intake/Output from this shift: Total I/O In: -  Out: 190 [Urine:190]  Labs: Recent Labs    05/18/20 1754 05/18/20 1754 05/18/20 1847 05/19/20 0048 05/19/20 0048 05/19/20 0628 05/19/20 0938 05/20/20 1208 05/20/20 1744 05/20/20 2302  WBC 19.8*  --   --  16.9*  --  14.7*  --   --   --   --   HGB 13.2  --   --  12.9*  --  12.8*  --   --   --   --   HCT 35.7*  --   --  35.9*  --  35.5*  --   --   --   --   PLT 281  --   --  252  --  227  --   --   --   --   CREATININE 2.60*   < >  --  2.18*   < > 2.00*   < > 1.71* 1.73* 1.73*  LABCREA  --   --  170  --   --   --   --   --   --   --    < > = values in this interval not displayed.   Estimated Creatinine Clearance: 43.4 mL/min (A) (by C-G formula based on SCr of 1.73 mg/dL (H)).   Microbiology: Recent Results (from the past 720 hour(s))  Urine Culture     Status: None   Collection Time: 05/18/20  6:47 PM   Specimen: Urine, Random  Result Value Ref Range Status   Specimen Description   Final    URINE, RANDOM Performed at [facility], [address], [place], [place] [address]    Special Requests   Final    NONE Performed at [facility], [address] [place], [place] [address]    Culture   Final    NO GROWTH Performed at [facility], [place] [address], [place], [place] [address]    Report  Status 05/20/2020 FINAL  Final  Blood culture (routine x 2)     Status: None (Preliminary result)   Collection Time: 05/18/20  9:03 PM   Specimen: BLOOD  Result Value Ref Range Status   Specimen Description BLOOD RIGHT ANTECUBITAL  Final   Special Requests   Final    BOTTLES DRAWN AEROBIC AND ANAEROBIC Blood Culture adequate volume   Culture   Final    NO GROWTH 2 DAYS Performed at [facility], [address] [place], [place] [address]    Report Status PENDING  Incomplete  Blood culture (routine x 2)     Status: None (Preliminary result)   Collection Time: 05/18/20  9:03 PM   Specimen: BLOOD  Result Value Ref Range Status   Specimen Description BLOOD BLOOD RIGHT WRIST  Final   Special Requests   Final    BOTTLES DRAWN AEROBIC AND ANAEROBIC Blood Culture  results may not be optimal due to an inadequate volume of blood received in culture bottles   Culture   Final    NO GROWTH 2 DAYS Performed at [facility], [address] [place], [place] [address]    Report Status PENDING  Incomplete  SARS Coronavirus 2 by RT PCR (hospital order, performed in [facility] hospital lab) Nasopharyngeal Nasopharyngeal Swab     Status: None   Collection Time: 05/18/20  9:28 PM   Specimen: Nasopharyngeal Swab  Result Value Ref Range Status   SARS Coronavirus 2 NEGATIVE NEGATIVE Final    Comment: (NOTE) SARS-CoV-2 target nucleic acids are NOT DETECTED.  The SARS-CoV-2 RNA is generally detectable in upper and lower respiratory specimens during the acute phase of infection. The lowest concentration of SARS-CoV-2 viral copies this assay can detect is 250 copies / mL. A negative result does not preclude SARS-CoV-2 infection and should not be used as the sole basis for treatment or other patient management decisions.  A negative result may occur with improper specimen collection / handling, submission of specimen other than nasopharyngeal swab, presence of viral mutation(s) within  the areas targeted by this assay, and inadequate number of viral copies (<250 copies / mL). A negative result must be combined with clinical observations, patient history, and epidemiological information.  Fact Sheet for Patients:   StrictlyIdeas.no  Fact Sheet for Healthcare Providers: BankingDealers.co.za  This test is not yet approved or  cleared by the [place] FDA and has been authorized for detection and/or diagnosis of SARS-CoV-2 by FDA under an Emergency Use Authorization (EUA).  This EUA will remain in effect (meaning this test can be used) for the duration of the COVID-19 declaration under Section 564(b)(1) of the Act, 21 U.S.C. section 360bbb-3(b)(1), unless the authorization is terminated or revoked sooner.  Performed at [facility], [address] [place], [place] [address]   MRSA PCR Screening     Status: None   Collection Time: 05/19/20  4:31 PM   Specimen: Nasopharyngeal  Result Value Ref Range Status   MRSA by PCR NEGATIVE NEGATIVE Final    Comment:        The GeneXpert MRSA Assay (FDA approved for NASAL specimens only), is one component of a comprehensive MRSA colonization surveillance program. It is not intended to diagnose MRSA infection nor to guide or monitor treatment for MRSA infections. Performed at [facility], [address], [place], [place] [address]     Medical History: Past Medical History:  Diagnosis Date  . Arthritis   . GERD (gastroesophageal reflux disease)   . Hypertension   . Rheumatoid arthritis ([place])   . Thyroid disease     Medications:  Scheduled:  . amLODipine  10 mg Oral Daily  . aspirin  81 mg Oral Daily  . atorvastatin  40 mg Oral Daily  . Chlorhexidine Gluconate Cloth  6 each Topical Daily  . docusate sodium  100 mg Oral BID  . enoxaparin (LOVENOX) injection  40 mg Subcutaneous Q24H  . feeding supplement (ENSURE ENLIVE)  237 mL Oral BID BM   . gabapentin  200 mg Oral QHS  . levothyroxine  88 mcg Oral QAC breakfast  . multivitamin with minerals  1 tablet Oral Daily  . pantoprazole  40 mg Oral Daily  . polyethylene glycol  17 g Oral BID  . tamsulosin  0.4 mg Oral Daily    Assessment: Patient w/ h/o HTN, hypothyroidism, RA, GERD admitted for weakness w/ nausea  and abdominal pain found to be in hyponatremia w/ an initially Na of 116 mEq/L. Patient is being started on 3% saline for further correction of sodium.   Goal of Therapy:  Na 135 - 145 w/ a rise no more than 8 to 12 mEq/L in 24 hours.  Plan:  07/27 @ 0130 Na 121 mEq/L trending appropriately, will continue to monitor w/ q2h labs per nephro.  Tobie Lords, PharmD, BCPS Clinical Pharmacist 05/21/2020,2:29 AM

## 2020-05-21 NOTE — Progress Notes (Signed)
Pt says his head feels weird prior to start of 3%. After starting 3% pt states his head still feel weird, as if it's being compressed which is the same as before.

## 2020-05-21 NOTE — Evaluation (Signed)
Occupational Therapy Evaluation Patient Details Name: Arthur Romero MRN: [id number] DOB: 06-08-1941 Today's Date: 05/21/2020    History of Present Illness Arthur Romero is a 79 y/o male who was admitted for hyponatremia. Chief complaints include general malaise, feeling weak and tired, hot and sweaty with nausea and poor oral intake x 3 days. Pt now with abdominal pain and constipation. PMH includes HTN, gout, GERD, hypothyroidism, and RA.   Clinical Impression   Pt was seen for OT evaluation this date. Prior to hospital admission, pt reports living by himself and has his sisters and landlord occasionally check in on him. Pt reports being relatively independent with basic ADL and med mgt, and that his sister has more recently been assisting with meal prep and transportation. Pt has not driven in approx 4-5 months. Currently pt demonstrates impairments as described below (See OT problem list) which functionally limit his ability to perform ADL/self-care tasks and ADL mobility at baseline level. Pt currently requires SUPERVISION for bed mobility, CGA to MIN A for ADL transfers EOB with bed elevated to mimic home set up. Functional transfer training in context of ADL and RW mgt with pt return demonstrating with minimal cueing. MIN A for LB ADL tasks. Pt sat EOB to perform grooming tasks with set up. Reported fatigue after <10 min sitting EOB and with ADL transfers and lateral steps EOB.  Pt would benefit from skilled OT to address noted impairments and functional limitations (see below for any additional details) in order to maximize safety and independence while minimizing falls risk and caregiver burden. Upon hospital discharge, recommend STR to maximize pt safety and return to PLOF. Pt's goal is to return home. Will work towards this goal and reassess d/c recommendation based on future progress.     Follow Up Recommendations  SNF    Equipment Recommendations  3 in 1 bedside commode     Recommendations for Other Services       Precautions / Restrictions Precautions Precautions: Fall Restrictions Weight Bearing Restrictions: No      Mobility Bed Mobility Overal bed mobility: Needs Assistance Bed Mobility: Supine to Sit;Sit to Supine     Supine to sit: Supervision Sit to supine: Supervision   General bed mobility comments: increased time/effort to perform, no dizziness  Transfers Overall transfer level: Needs assistance Equipment used: Rolling walker (2 wheeled) Transfers: Sit to/from Stand Sit to Stand: Min guard;Min assist;From elevated surface         General transfer comment: bed elevated to mimic home set up; CGA to MIN A for transfer with VC for hand/foot placement to improve safety/technique with RW    Balance Overall balance assessment: Needs assistance Sitting-balance support: Feet supported Sitting balance-Leahy Scale: Good     Standing balance support: Bilateral upper extremity supported;During functional activity Standing balance-Leahy Scale: Fair Standing balance comment: initially with slight posterior lean and bracing against bed, with cues able to shift weight anteriorly to improve static standing balance                           ADL either performed or assessed with clinical judgement   ADL Overall ADL's : Needs assistance/impaired                                       General ADL Comments: MIN A for LB ADL tasks involving STS transfers, CGA-MIN  A for ADL transfers, cues for safety/balance; set up for brushing teeth seated EOB     Vision Patient Visual Report: No change from baseline       Perception     Praxis      Pertinent Vitals/Pain Pain Assessment: No/denies pain     Hand Dominance     Extremity/Trunk Assessment Upper Extremity Assessment Upper Extremity Assessment: Generalized weakness   Lower Extremity Assessment Lower Extremity Assessment: Generalized weakness   Cervical /  Trunk Assessment Cervical / Trunk Assessment: Kyphotic   Communication Communication Communication: No difficulties   Cognition Arousal/Alertness: Awake/alert Behavior During Therapy: WFL for tasks assessed/performed Overall Cognitive Status: Within Functional Limits for tasks assessed                                     General Comments  VSS throughout session    Exercises Other Exercises Other Exercises: functional ADL transfer training, RW mgt, lateral steps with RW to improve positioning in bed; pt tolerated sitting EOB for approx 10 min for brushing teeth and washing face with set up. No LOB. Pt endorsing minimal fatigue until after STS transfers   Shoulder Instructions      Home Living Family/patient expects to be discharged to:: Private residence Living Arrangements: Alone Available Help at Discharge: Family;Friend(s);Available PRN/intermittently (sisters and landlord) Type of Home: House Home Access: Stairs to enter [employer] of Steps: 4 Entrance Stairs-Rails: Right Home Layout: One level     Bathroom Shower/Tub: Occupational psychologist: Handicapped height     Home Equipment: None;Grab bars - tub/shower   Additional Comments: Pt reports living alone however he has two sisters and his landlord that could come and check on him from time to time.      Prior Functioning/Environment Level of Independence: Needs assistance  Gait / Transfers Assistance Needed: Pt ambulated with RW and reported havign someone provide a little assistance for balance/steadying (agrees to less than 25%). Pt reports that he was mod I with sit <> stand transfers with RW and independence with bed mobility. ADL's / Homemaking Assistance Needed: Per pt, he was generally independent with ADL tasks. Sister has been assisting more recently with meal prep   Comments: Pt reported using a RW for ambulation and transfers however when asked if he had any equipment,  pt denied having any.; pt also endorses multiple falls over 2 year span        OT Problem List: Decreased strength;Decreased activity tolerance;Impaired balance (sitting and/or standing);Decreased knowledge of use of DME or AE      OT Treatment/Interventions: Self-care/ADL training;Therapeutic exercise;Therapeutic activities;DME and/or AE instruction;Patient/family education;Balance training    OT Goals(Current goals can be found in the care plan section) Acute Rehab OT Goals Patient Stated Goal: get stronger and go home OT Goal Formulation: With patient Time For Goal Achievement: 06/04/20 Potential to Achieve Goals: Good ADL Goals Pt Will Perform Lower Body Dressing: sit to/from stand;with min guard assist;with adaptive equipment Pt Will Transfer to Toilet: with supervision;ambulating (elevated commode, LRAD for amb) Additional ADL Goal #1: Pt will verbalize plan to implement at least 2 learned energy conservation/falls prevention strategies to implement upon return home.  OT Frequency: Min 1X/week   Barriers to D/C:            Co-evaluation              AM-PAC OT "6 Clicks" Daily  Activity     Outcome Measure Help from another person eating meals?: None Help from another person taking care of personal grooming?: None Help from another person toileting, which includes using toliet, bedpan, or urinal?: A Little Help from another person bathing (including washing, rinsing, drying)?: A Little Help from another person to put on and taking off regular upper body clothing?: None Help from another person to put on and taking off regular lower body clothing?: A Little 6 Click Score: 21   End of Session Equipment Utilized During Treatment: Gait belt;Rolling walker  Activity Tolerance: Patient tolerated treatment well Patient left: in bed;with call bell/phone within reach;with nursing/sitter in room (RN in to assist with rest of pt's bath)  OT Visit Diagnosis: Other  abnormalities of gait and mobility (R26.89);History of falling (Z91.81);Muscle weakness (generalized) (M62.81)                Time: 7703-4035 OT Time Calculation (min): 27 min Charges:  OT General Charges $OT Visit: 1 Visit OT Evaluation $OT Eval Moderate Complexity: 1 Mod OT Treatments $Self Care/Home Management : 8-22 mins  Jeni Salles, MPH, MS, OTR/L ascom [phone] 05/21/20, 3:22 PM

## 2020-05-21 NOTE — Progress Notes (Addendum)
MEDICATION RELATED CONSULT NOTE - INITIAL   Pharmacy Consult for 3% saline Indication: hyponatremia  Allergies  Allergen Reactions  . Ace Inhibitors Swelling    Lip swelling Lip swelling Lip swelling   . Prednisone Hives    Patient Measurements: Height: 6\' 3"  (190.5 cm) Weight: 89.7 kg (197 lb 12 oz) IBW/kg (Calculated) : 84.5  Vital Signs: Temp: 98.4 F (36.9 C) (07/26 1900) Temp Source: Oral (07/26 1900) BP: 115/57 (07/26 2000) Pulse Rate: 89 (07/26 2000) Intake/Output from previous day: 07/26 0701 - 07/27 0700 In: 2007.3 [P.O.:720; I.V.:1187.3; IV Piggyback:100] Out: 415 [Urine:415] Intake/Output from this shift: Total I/O In: -  Out: 190 [Urine:190]  Labs: Recent Labs    05/18/20 1754 05/18/20 1754 05/18/20 1847 05/19/20 0048 05/19/20 0048 05/19/20 0628 05/19/20 0938 05/20/20 1208 05/20/20 1744 05/20/20 2302  WBC 19.8*  --   --  16.9*  --  14.7*  --   --   --   --   HGB 13.2  --   --  12.9*  --  12.8*  --   --   --   --   HCT 35.7*  --   --  35.9*  --  35.5*  --   --   --   --   PLT 281  --   --  252  --  227  --   --   --   --   CREATININE 2.60*   < >  --  2.18*   < > 2.00*   < > 1.71* 1.73* 1.73*  LABCREA  --   --  170  --   --   --   --   --   --   --    < > = values in this interval not displayed.   Estimated Creatinine Clearance: 43.4 mL/min (A) (by C-G formula based on SCr of 1.73 mg/dL (H)).   Microbiology: Recent Results (from the past 720 hour(s))  Urine Culture     Status: None   Collection Time: 05/18/20  6:47 PM   Specimen: Urine, Random  Result Value Ref Range Status   Specimen Description   Final    URINE, RANDOM Performed at [facility], [address] [place], [place] [address]    Special Requests   Final    NONE Performed at [facility], [address] [place], [place] [address]    Culture   Final    NO GROWTH Performed at [facility], [place] [address] [place], [place] [address]    Report  Status 05/20/2020 FINAL  Final  Blood culture (routine x 2)     Status: None (Preliminary result)   Collection Time: 05/18/20  9:03 PM   Specimen: BLOOD  Result Value Ref Range Status   Specimen Description BLOOD RIGHT ANTECUBITAL  Final   Special Requests   Final    BOTTLES DRAWN AEROBIC AND ANAEROBIC Blood Culture adequate volume   Culture   Final    NO GROWTH 2 DAYS Performed at [facility], [address], [place],  [address]    Report Status PENDING  Incomplete  Blood culture (routine x 2)     Status: None (Preliminary result)   Collection Time: 05/18/20  9:03 PM   Specimen: BLOOD  Result Value Ref Range Status   Specimen Description BLOOD BLOOD RIGHT WRIST  Final   Special Requests   Final    BOTTLES DRAWN AEROBIC AND ANAEROBIC Blood Culture  results may not be optimal due to an inadequate volume of blood received in culture bottles   Culture   Final    NO GROWTH 2 DAYS Performed at [facility], [address] [place], [place] [address]    Report Status PENDING  Incomplete  SARS Coronavirus 2 by RT PCR (hospital order, performed in [facility] hospital lab) Nasopharyngeal Nasopharyngeal Swab     Status: None   Collection Time: 05/18/20  9:28 PM   Specimen: Nasopharyngeal Swab  Result Value Ref Range Status   SARS Coronavirus 2 NEGATIVE NEGATIVE Final    Comment: (NOTE) SARS-CoV-2 target nucleic acids are NOT DETECTED.  The SARS-CoV-2 RNA is generally detectable in upper and lower respiratory specimens during the acute phase of infection. The lowest concentration of SARS-CoV-2 viral copies this assay can detect is 250 copies / mL. A negative result does not preclude SARS-CoV-2 infection and should not be used as the sole basis for treatment or other patient management decisions.  A negative result may occur with improper specimen collection / handling, submission of specimen other than nasopharyngeal swab, presence of viral mutation(s) within  the areas targeted by this assay, and inadequate number of viral copies (<250 copies / mL). A negative result must be combined with clinical observations, patient history, and epidemiological information.  Fact Sheet for Patients:   StrictlyIdeas.no  Fact Sheet for Healthcare Providers: BankingDealers.co.za  This test is not yet approved or  cleared by the [place] FDA and has been authorized for detection and/or diagnosis of SARS-CoV-2 by FDA under an Emergency Use Authorization (EUA).  This EUA will remain in effect (meaning this test can be used) for the duration of the COVID-19 declaration under Section 564(b)(1) of the Act, 21 U.S.C. section 360bbb-3(b)(1), unless the authorization is terminated or revoked sooner.  Performed at [facility], [address] [place], [place] [address]   MRSA PCR Screening     Status: None   Collection Time: 05/19/20  4:31 PM   Specimen: Nasopharyngeal  Result Value Ref Range Status   MRSA by PCR NEGATIVE NEGATIVE Final    Comment:        The GeneXpert MRSA Assay (FDA approved for NASAL specimens only), is one component of a comprehensive MRSA colonization surveillance program. It is not intended to diagnose MRSA infection nor to guide or monitor treatment for MRSA infections. Performed at [facility], [address], [place], [place] [address]     Medical History: Past Medical History:  Diagnosis Date  . Arthritis   . GERD (gastroesophageal reflux disease)   . Hypertension   . Rheumatoid arthritis ([place])   . Thyroid disease     Medications:  Scheduled:  . amLODipine  10 mg Oral Daily  . aspirin  81 mg Oral Daily  . atorvastatin  40 mg Oral Daily  . Chlorhexidine Gluconate Cloth  6 each Topical Daily  . docusate sodium  100 mg Oral BID  . enoxaparin (LOVENOX) injection  40 mg Subcutaneous Q24H  . feeding supplement (ENSURE ENLIVE)  237 mL Oral BID BM   . gabapentin  200 mg Oral QHS  . levothyroxine  88 mcg Oral QAC breakfast  . multivitamin with minerals  1 tablet Oral Daily  . pantoprazole  40 mg Oral Daily  . polyethylene glycol  17 g Oral BID  . tamsulosin  0.4 mg Oral Daily    Assessment: Patient w/ h/o HTN, hypothyroidism, RA, GERD admitted for weakness w/ nausea  and abdominal pain found to be in hyponatremia w/ an initially Na of 116 mEq/L. Patient is being started on 3% saline for further correction of sodium.   Goal of Therapy:  Na 135 - 145 w/ a rise no more than 8 to 12 mEq/L in 24 hours.  Plan:  Patient is anticipated to be started on 3% saline at 30 ml/hr. RN called and instructed her that it is recommended for patient to have a central line to run 3% saline through; however, patient only has a peripheral line currently, and that a rate of 30 ml/hr for initial treatment is appropriate, higher rates will require a central line. Will monitor q2h sodium labs and make recommendations based on sodium trends.  Tobie Lords, PharmD, BCPS Clinical Pharmacist 05/21/2020,12:48 AM

## 2020-05-21 NOTE — TOC Initial Note (Signed)
Transition of Care [facility]) - Initial/Assessment Note    Patient Details  Name: Arthur Romero MRN: [id number] Date of Birth: 28-Mar-1941  Transition of [facility]) CM/SW Contact:    Candie Chroman, LCSW Phone Number: 05/21/2020, 11:12 AM  Clinical Narrative: CSW met with patient. No supports at bedside. CSW introduced role and explained that PT recommendations would be discussed. Patient is willing to consider SNF placement but hopes he can improve enough to go home. Will send out SNF referral and review bed offers with patient when available. No further concerns. CSW encouraged patient to contact CSW as needed. CSW will continue to follow patient for support and facilitate discharge to SNF vs. Home with home health when stable.               Expected Discharge Plan: Skilled Nursing Facility Barriers to Discharge: Ship broker, Continued Medical Work up   Patient Goals and CMS Choice        Expected Discharge Plan and Services Expected Discharge Plan: [place] Acute Care Choice: [place] arrangements for the past 2 months: [place]                                      Prior Living Arrangements/Services Living arrangements for the past 2 months: Single Family Home Lives with:: Self Patient language and need for interpreter reviewed:: Yes Do you feel safe going back to the place where you live?: Yes      Need for Family Participation in Patient Care: Yes (Comment) Care giver support system in place?: No (comment)   Criminal Activity/Legal Involvement Pertinent to Current Situation/Hospitalization: No - Comment as needed  Activities of Daily Living Home Assistive Devices/Equipment: Environmental consultant (specify type), Cane (specify quad or straight) ADL Screening (condition at time of admission) Patient's cognitive ability adequate to safely complete daily activities?: Yes Is the patient deaf or  have difficulty hearing?: No Does the patient have difficulty seeing, even when wearing glasses/contacts?: No Does the patient have difficulty concentrating, remembering, or making decisions?: No Patient able to express need for assistance with ADLs?: Yes Does the patient have difficulty dressing or bathing?: No Independently performs ADLs?: Yes (appropriate for developmental age) Does the patient have difficulty walking or climbing stairs?: Yes Weakness of Legs: Both Weakness of Arms/Hands: Both  Permission Sought/Granted Permission sought to share information with : Facility Art therapist granted to share information with : Yes, Verbal Permission Granted     Permission granted to share info w AGENCY: SNF's        Emotional Assessment Appearance:: Appears stated age Attitude/Demeanor/Rapport: Engaged, Gracious Affect (typically observed): Accepting, Appropriate, Calm, Pleasant Orientation: : Oriented to Self, Oriented to Place, Oriented to  Time, Oriented to Situation Alcohol / Substance Use: Not Applicable Psych Involvement: No (comment)  Admission diagnosis:  Dehydration [E86.0] Hyponatremia [E87.1] Weakness [R53.1] Abdominal pain [R10.9] Sepsis ([place]) [A41.9] Patient Active Problem List   Diagnosis Date Noted  . Malnutrition of moderate degree 05/20/2020  . Abdominal pain 05/19/2020  . Hyponatremia 05/19/2020  . AKI (acute kidney injury) ([place]) 05/19/2020  . Acute urinary retention 05/19/2020  . Generalized weakness 05/19/2020  . Aortic atherosclerosis ([place]) 04/26/2020  . Foot pain, bilateral 04/26/2020  . Acquired hypothyroidism 03/15/2020  . Essential hypertension 03/15/2020  . 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 Mixed hyperlipidemia 03/15/2020  .  Elevated hemoglobin A1c 03/15/2020  . Mononeuropathy 03/15/2020   PCP:  Karamalegos, Alexander J, DO Pharmacy:   [facility] - [place], [place] - [address] [place]  [place] [address] Phone: [phone] Fax: [phone]  [facility] - [place], [place] - [address] [place] [address] Phone: [phone] Fax: [phone]     Social Determinants of Health (SDOH) Interventions    Readmission Risk Interventions No flowsheet data found.  

## 2020-05-21 NOTE — Progress Notes (Signed)
PROGRESS NOTE    Arthur Romero  FWY:[id number] DOB: 10-Mar-1941 DOA: 05/18/2020 PCP: Olin Hauser, DO   Brief Narrative: [name] is a 79 y.o. male with medical history significant for hypertension, gout, GERD, hypothyroidism. Patient presented secondary to weakness, nausea and abdominal pain and found to have profound hyponatremia in addition to evidence AKI/urinary retention. Patient started on empiric antibiotics for possible infectious etiology. Initially managed with normal saline for hyponatremia and now on hypertonic saline.   Assessment & Plan:   Principal Problem:   Hyponatremia Active Problems:   Acquired hypothyroidism   Essential hypertension   Abdominal pain   AKI (acute kidney injury) ([place])   Acute urinary retention   Generalized weakness   Malnutrition of moderate degree   Possible UTI Patient presented with urinary retention. No other symptoms. Urinalysis unremarkable. No urine culture obtained on admission. Patient does not meet criteria for Sepsis per my review. Started on empiric Vancomycin, Cefepime and Flagyl. Procalcitonin with slight increase. Blood cultures obtained and have no growth to date. Urine culture with no growth. IV antibiotics discontinued 7/26.   Symptomatic hyponatremia Patient presented with a sodium of 114 and started on NS IV @ 100 ml/hr, increased to 125 ml/hr after initial sodium plateau at 122 and eventual decrease down to 120. IV fluids held as repeat urine studies suggest SIADH. Clinical presentation suggests hypovolemia. TSH normal. Physical exam euvolemic. Patient's sodium decreased slightly to 119 five hours after holding IV fluids and nephrology was consulted for hypertonic saline infusion. Sodium improving quickly. -BMP daily -Sodium q4 hours while on hypertonic saline -Nephrology recommendations  AKI Creatinine of 2.60 on admission. Baseline creatinine of 0.84. Improved with IV fluids initially with slight  worsening. Possibly pre-renal with evidence of soft BP/hypotension vs secondary to urinary retention. No hydronephrosis noted on CT abdomen/pelvis.  Acute urinary retention Unknown etiology. Foley placed on 7/25.  -Voiding trial once out of the ICU/stepdown  Hypothyroidism -Continue Synthroid  Abdominal pain Likely secondary to urinary retention. Appears to be resolved. Evidence of possible pancreatitis on imaging but no correlating with physical exam or labs  Atrial fibrillation New onset. 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 He reports having to be stretched in the past. No current concerns on evaluation but if symptoms worsen, may consider inpatient barium swallow vs outpatient management.  Constipation Received an enema last night without any result. Abdomen is less distended. Abdominal x-ray without evidence of obstruction. -Miralax BID, lactulose x1  DVT prophylaxis: Lovenox Code Status:   Code Status: Full Code Family Communication: None at bedside Disposition Plan: Discharge home vs SNF pending continued management of sodium, improvement of AKI   Consultants:   Nephrology  Cardiology  Procedures:   None  Antimicrobials:  Vancomycin  Cefepime  Flagyl    Subjective: Some abdominal discomfort. Wanting to have a bowel movement.  Objective: Vitals:   05/21/20 0200 05/21/20 0520 05/21/20 0600 05/21/20 0700  BP: (!) 96/60 112/67 (!) 105/47   Pulse: 83 97 80 83  Resp: (!) 27 21 18  (!) 24  Temp:      TempSrc:      SpO2: 95% 93% 94% 95%  Weight:      Height:        Intake/Output Summary (Last 24 hours) at 05/21/2020 0834 Last data filed at 05/21/2020 0744 Gross per 24 hour  Intake 2080.38 ml  Output 780 ml  Net 1300.38 ml   Filed Weights   05/18/20 1750 05/19/20 1630   Weight: 85.3 kg 89.7 kg    Examination:  General exam: Appears calm and comfortable  Respiratory system: Clear to auscultation. Respiratory effort normal. Cardiovascular system: S1 & S2 heard, Irregular rhythm, normal rate. No murmurs, rubs, gallops or clicks. Gastrointestinal system: Abdomen is nondistended, soft and nontender. No organomegaly or masses felt. Normal bowel sounds heard. Central nervous system: Alert and oriented. No focal neurological deficits. Musculoskeletal: No edema. No calf tenderness Skin: No cyanosis. No rashes Psychiatry: Judgement and insight appear normal. Mood & affect appropriate.     Data Reviewed: I have personally reviewed following labs and imaging studies  CBC Lab Results  Component Value Date   WBC 14.7 (H) 05/19/2020   RBC 4.03 (L) 05/19/2020   HGB 12.8 (L) 05/19/2020   HCT 35.5 (L) 05/19/2020   MCV 88.1 05/19/2020   MCH 31.8 05/19/2020   PLT 227 05/19/2020   MCHC 36.1 (H) 05/19/2020   RDW 12.6 05/19/2020   LYMPHSABS 2,020 03/18/2020   MONOABS 0.8 07/03/2018   EOSABS 230 03/18/2020   BASOSABS 50 22/11/5425     Last metabolic panel Lab Results  Component Value Date   NA 124 (L) 05/21/2020   K 4.0 05/21/2020   CL 98 05/21/2020   CO2 17 (L) 05/21/2020   BUN 44 (H) 05/21/2020   CREATININE 1.59 (H) 05/21/2020   GLUCOSE 140 (H) 05/21/2020   GFRNONAA 42 (L) 05/21/2020   GFRAA 48 (L) 05/21/2020   CALCIUM 7.6 (L) 05/21/2020   PROT 7.8 04/19/2020   ALBUMIN 4.5 04/19/2020   BILITOT 0.7 04/19/2020   ALKPHOS 88 04/19/2020   AST 23 04/19/2020   ALT 26 04/19/2020   ANIONGAP 9 05/21/2020    CBG (last 3)  Recent Labs    05/19/20 1630  GLUCAP 177*     GFR: Estimated Creatinine Clearance: 47.2 mL/min (A) (by C-G formula based on SCr of 1.59 mg/dL (H)).  Coagulation Profile: No results for input(s): INR, PROTIME in the last 168 hours.  Recent Results (from the past 240 hour(s))  Urine Culture     Status: None   Collection  Time: 05/18/20  6:47 PM   Specimen: Urine, Random  Result Value Ref Range Status   Specimen Description   Final    URINE, RANDOM Performed at [facility], [address] [place], [place] [address]    Special Requests   Final    NONE Performed at [facility], [address], [place], [place] [address]    Culture   Final    NO GROWTH Performed at [facility], [place] [address] [place], [place] [address]    Report Status 05/20/2020 FINAL  Final  Blood culture (routine x 2)     Status: None (Preliminary result)   Collection Time: 05/18/20  9:03 PM   Specimen: BLOOD  Result Value Ref Range Status   Specimen Description BLOOD RIGHT ANTECUBITAL  Final   Special Requests   Final    BOTTLES DRAWN AEROBIC AND ANAEROBIC Blood Culture adequate volume   Culture   Final    NO GROWTH 3 DAYS Performed at [facility], [address], [place], [place] [address]    Report Status PENDING  Incomplete  Blood culture (routine x 2)     Status: None (Preliminary result)   Collection Time: 05/18/20  9:03 PM   Specimen:  BLOOD  Result Value Ref Range Status   Specimen Description BLOOD BLOOD RIGHT WRIST  Final   Special Requests   Final    BOTTLES DRAWN AEROBIC AND ANAEROBIC Blood Culture results may not be optimal due to an inadequate volume of blood received in culture bottles   Culture   Final    NO GROWTH 3 DAYS Performed at [facility], [address] [place], [place] [address]    Report Status PENDING  Incomplete  SARS Coronavirus 2 by RT PCR (hospital order, performed in [facility] hospital lab) Nasopharyngeal Nasopharyngeal Swab     Status: None   Collection Time: 05/18/20  9:28 PM   Specimen: Nasopharyngeal Swab  Result Value Ref Range Status   SARS Coronavirus 2 NEGATIVE NEGATIVE Final    Comment: (NOTE) SARS-CoV-2 target nucleic acids are NOT DETECTED.  The SARS-CoV-2 RNA is generally detectable in upper and lower respiratory  specimens during the acute phase of infection. The lowest concentration of SARS-CoV-2 viral copies this assay can detect is 250 copies / mL. A negative result does not preclude SARS-CoV-2 infection and should not be used as the sole basis for treatment or other patient management decisions.  A negative result may occur with improper specimen collection / handling, submission of specimen other than nasopharyngeal swab, presence of viral mutation(s) within the areas targeted by this assay, and inadequate number of viral copies (<250 copies / mL). A negative result must be combined with clinical observations, patient history, and epidemiological information.  Fact Sheet for Patients:   StrictlyIdeas.no  Fact Sheet for Healthcare Providers: BankingDealers.co.za  This test is not yet approved or  cleared by the [place] FDA and has been authorized for detection and/or diagnosis of SARS-CoV-2 by FDA under an Emergency Use Authorization (EUA).  This EUA will remain in effect (meaning this test can be used) for the duration of the COVID-19 declaration under Section 564(b)(1) of the Act, 21 U.S.C. section 360bbb-3(b)(1), unless the authorization is terminated or revoked sooner.  Performed at [facility], [address] [place], [place] [address]   MRSA PCR Screening     Status: None   Collection Time: 05/19/20  4:31 PM   Specimen: Nasopharyngeal  Result Value Ref Range Status   MRSA by PCR NEGATIVE NEGATIVE Final    Comment:        The GeneXpert MRSA Assay (FDA approved for NASAL specimens only), is one component of a comprehensive MRSA colonization surveillance program. It is not intended to diagnose MRSA infection nor to guide or monitor treatment for MRSA infections. Performed at [facility], [address], [place], [place] [address]         Radiology Studies: DG Abd 1 View  Result Date:  05/20/2020 CLINICAL DATA:  Abdominal pain. EXAM: ABDOMEN - 1 VIEW COMPARISON:  CT 2 days ago 05/18/2020 FINDINGS: Portable supine view of the lower abdomen and pelvis. No evidence of free intra-abdominal air on this limited view. No bowel dilatation to suggest obstruction. Small volume of stool in the proximal colon. Tacks in the anterior abdominal wall from prior hernia repair. No radiopaque calculi or abnormal soft tissue calcifications. Splenic granuloma on CT not included in the field of view. IMPRESSION: No radiographic explanation for abdominal pain on this single portable supine view. Electronically Signed   By: Keith Rake M.D.   On: 05/20/2020 19:47        Scheduled Meds: . amLODipine  10 mg Oral Daily  .  aspirin  81 mg Oral Daily  . atorvastatin  40 mg Oral Daily  . Chlorhexidine Gluconate Cloth  6 each Topical Daily  . docusate sodium  100 mg Oral BID  . enoxaparin (LOVENOX) injection  40 mg Subcutaneous Q24H  . feeding supplement (ENSURE ENLIVE)  237 mL Oral BID BM  . gabapentin  200 mg Oral QHS  . lactulose  20 g Oral Once  . levothyroxine  88 mcg Oral QAC breakfast  . multivitamin with minerals  1 tablet Oral Daily  . pantoprazole  40 mg Oral Daily  . polyethylene glycol  17 g Oral BID  . tamsulosin  0.4 mg Oral Daily   Continuous Infusions: . sodium chloride (hypertonic) 30 mL/hr at 05/21/20 0744     LOS: 2 days     Cordelia Poche, MD [facility] 05/21/2020, 8:34 AM  If 7PM-7AM, please contact night-coverage [email or web address]

## 2020-05-21 NOTE — Progress Notes (Signed)
*  PRELIMINARY RESULTS* Echocardiogram 2D Echocardiogram has been performed.  Arthur Romero 05/21/2020, 11:36 AM

## 2020-05-21 NOTE — Progress Notes (Addendum)
° ° °  BRIEF OVERNIGHT PROGRESS REPORT   SUBJECTIVE:RN reports drop in serum Na+ to 119.   OBJECTIVE: On bedside assessment, he was afebrile with blood pressure 94/56 mm Hg and pulse rate 77 beats/min. There were no focal neurological deficits; he was alert and oriented x4. Appears lethargic but able to hold appropriate conversation.  BRIEF PATIENT DESCRIPTION: 79 year old male with PMH of HTN, gout, GERD, hypothyroidism, and RA presenting with generalized malaise, progressive weakness status post fall associated with nausea, abdominal cramping and poor p.o. intake x3 days found to have significant hyponatremia (116), AKI/urinary retention and possible UTI.  ASSESSMENT/PLAN: 1.Symptomatic hyponatremia - Na+ 116 on presentation with serum osmolality <280. 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 -Check serial sodium Q2 hours -Discussed  with on-call Nephrologist Dr. Murlean Iba given drop in Sodium <120.  Recommends starting hypertonic solution 3% with serial sodium checks every 2 hours. Appreciate input. Will follow in the am -Nephrology consult placed     Rufina Falco, RN, BSN, MSN, DNP, CCRN, FNP-C  [facility] Nurse Practitioner  [facility]

## 2020-05-21 NOTE — Consult Note (Signed)
Cardiology Consultation:   Patient ID: DENO SIDA; [id number]; [birth year]   Admit date: 05/18/2020 Date of Consult: 05/21/2020  Primary Care Provider: Olin Hauser, DO Primary Cardiologist: New to [facility] - consult by Rockey Situ Primary Electrophysiologist:  None   Patient Profile:   Arthur Romero is a 79 y.o. male with a hx of prediabetes, HTN, HLD, hypothyroidism, chronic dizziness, neuropathy, and OA who is being seen today for the evaluation of Afib at the request of Dr. Lonny Prude.  History of Present Illness:   [name] has previously undergone nuclear stress testing through [facility] for exertional chest pain in 2017 and again in 2019 with both studies showing no evidence of significant ischemia or scar with preserved LVSF.   He was recently seen in the ED in 03/2020 with weakness, abdominal pain, and constipation. Workup was unrevealing. He was seen at [facility] ED on 05/08/2020 with similar symptoms with repeat workup being reassuring.   He presented to the ED on 7/24 with weakness, fever, cough, nausea, and constipation and was found to have symptomatic hyponatremia with AKI and urinary retention. CT of the abdomen and pelvis concerning for acute pancreatitis with new trace bilateral pleural effusions with the right greater than the left, moderate sized hiatal hernia, distended urinary bladder, and free fluid within the abdomen. He was started on empiric ABX and initially NS now with hypertonic saline. Initial EKG on 7/24 showed NSR with 1st degree AV block, inferior TWI. On 05/20/2020, he was noted to be in new onset rate-controlled Afib with EKG at that time showing Afib, 67 bpm, inferior TWI. Review of EKGs in Epic and Care Everywhere show a baseline EKG of sinus rhythm with first degree AV block with no prior documented episodes of Afib. CHADS2VASc 4 (HTN, age x2, vascular disease). He denies any chest pain, dyspnea, palpitations, lower extremity swelling, orthopnea, or PND.  He does note some early satiety, though has attributed this to his constipation. He did have one fall last week.   Past Medical History:  Diagnosis Date  . Arthritis   . GERD (gastroesophageal reflux disease)   . Hypertension   . Rheumatoid arthritis ([place])   . Thyroid disease     Past Surgical History:  Procedure Laterality Date  . CARDIAC SURGERY    . SHOULDER SURGERY Right 2016  . TYMPANOPLASTY Right 1987  . vocal cords  1989   polyps     Home Meds: Prior to Admission medications   Medication Sig Start Date End Date Taking? Authorizing Provider  allopurinol (ZYLOPRIM) 300 MG tablet Take 300 mg by mouth daily.   Yes [provider]  amLODipine (NORVASC) 10 MG tablet Take 1 tablet (10 mg total) by mouth daily. 04/18/20  Yes Karamalegos, Devonne Doughty, DO  aspirin 81 MG chewable tablet Chew 81 mg by mouth daily.   Yes [provider]  atorvastatin (LIPITOR) 40 MG tablet Take 1 tablet (40 mg total) by mouth daily. 04/18/20  Yes Karamalegos, Devonne Doughty, DO  cephALEXin (KEFLEX) 500 MG capsule Take 1 capsule (500 mg total) by mouth 3 (three) times daily. For 7 days 05/16/20  Yes Karamalegos, Devonne Doughty, DO  gabapentin (NEURONTIN) 100 MG capsule Start 1 capsule daily at bed time, increase by 1 cap every 2-3 days as tolerated up to take 3 at once in evening. Patient taking differently: Take 200 mg by mouth at bedtime.  04/18/20  Yes Karamalegos, Devonne Doughty, DO  levothyroxine (SYNTHROID) 88 MCG tablet Take 1  tablet (88 mcg total) by mouth daily. 03/27/20  Yes Karamalegos, Devonne Doughty, DO  omeprazole (PRILOSEC) 20 MG capsule Take 1 capsule (20 mg total) by mouth daily. 04/18/20  Yes Karamalegos, Alexander J, DO  ondansetron (ZOFRAN ODT) 4 MG disintegrating tablet Take 1 tablet (4 mg total) by mouth every 8 (eight) hours as needed for nausea or vomiting. 04/26/20  Yes Karamalegos, Devonne Doughty, DO  acetaminophen (TYLENOL) 500 MG tablet Take by mouth.    [provider]     Inpatient Medications: Scheduled Meds: . amLODipine  10 mg Oral Daily  . aspirin  81 mg Oral Daily  . atorvastatin  40 mg Oral Daily  . Chlorhexidine Gluconate Cloth  6 each Topical Daily  . docusate sodium  100 mg Oral BID  . enoxaparin (LOVENOX) injection  40 mg Subcutaneous Q24H  . feeding supplement (ENSURE ENLIVE)  237 mL Oral BID BM  . gabapentin  200 mg Oral QHS  . levothyroxine  88 mcg Oral QAC breakfast  . multivitamin with minerals  1 tablet Oral Daily  . pantoprazole  40 mg Oral Daily  . polyethylene glycol  17 g Oral BID  . tamsulosin  0.4 mg Oral Daily   Continuous Infusions: . sodium chloride (hypertonic)     PRN Meds: acetaminophen **OR** acetaminophen, HYDROcodone-acetaminophen, morphine injection, ondansetron **OR** ondansetron (ZOFRAN) IV  Allergies:   Allergies  Allergen Reactions  . Ace Inhibitors Swelling    Lip swelling Lip swelling Lip swelling   . Prednisone Hives    Social History:   Social History   Socioeconomic History  . Marital status: Single    Spouse name: Not on file  . Number of children: Not on file  . Years of education: high school  . Highest education level: High school graduate  Occupational History  . Not on file  Tobacco Use  . Smoking status: Never Smoker  . Smokeless tobacco: Never Used  Vaping Use  . Vaping Use: Never used  Substance and Sexual Activity  . Alcohol use: Never    Alcohol/week: 1.0 standard drink    Types: 1 Glasses of wine per week  . Drug use: Never  . Sexual activity: Not on file  Other Topics Concern  . Not on file  Social History Narrative  . Not on file   Social Determinants of Health   Financial Resource Strain:   . Difficulty of Paying Living Expenses:   Food Insecurity:   . Worried About Charity fundraiser in the Last Year:   . Arboriculturist in the Last Year:   Transportation Needs:   . Film/video editor (Medical):   Marland Kitchen Lack of Transportation (Non-Medical):   Physical  Activity:   . Days of Exercise per Week:   . Minutes of Exercise per Session:   Stress:   . Feeling of Stress :   Social Connections:   . Frequency of Communication with Friends and Family:   . Frequency of Social Gatherings with Friends and Family:   . Attends Religious Services:   . Active Member of Clubs or Organizations:   . Attends Archivist Meetings:   Marland Kitchen Marital Status:   Intimate Partner Violence:   . Fear of Current or Ex-Partner:   . Emotionally Abused:   Marland Kitchen Physically Abused:   . Sexually Abused:      Family History:   Family History  Problem Relation Age of Onset  . Hypertension Mother   . Osteoarthritis  Mother   . Parkinson's disease Mother   . Heart attack Father 52  . Tuberculosis Father   . Stroke Brother   . Diabetes Brother     ROS:  Review of Systems  Constitutional: Positive for malaise/fatigue. Negative for chills, diaphoresis, fever and weight loss.  HENT: Negative for congestion.   Eyes: Negative for discharge and redness.  Respiratory: Negative for cough, hemoptysis, sputum production, shortness of breath and wheezing.   Cardiovascular: Negative for chest pain, palpitations, orthopnea, claudication, leg swelling and PND.  Gastrointestinal: Positive for abdominal pain, constipation and nausea. Negative for blood in stool, diarrhea, heartburn, melena and vomiting.  Genitourinary: Negative for hematuria.  Musculoskeletal: Negative for falls and myalgias.  Skin: Negative for rash.  Neurological: Positive for weakness. Negative for dizziness, tingling, tremors, sensory change, speech change, focal weakness and loss of consciousness.  Endo/Heme/Allergies: Does not bruise/bleed easily.  Psychiatric/Behavioral: Negative for substance abuse. The patient is not nervous/anxious.   All other systems reviewed and are negative.     Physical Exam/Data:   Vitals:   05/21/20 0200 05/21/20 0520 05/21/20 0600 05/21/20 0700  BP: (!) 96/60 112/67 (!)  105/47   Pulse: 83 97 80 83  Resp: (!) 27 21 18  (!) 24  Temp:      TempSrc:      SpO2: 95% 93% 94% 95%  Weight:      Height:        Intake/Output Summary (Last 24 hours) at 05/21/2020 0948 Last data filed at 05/21/2020 0744 Gross per 24 hour  Intake 2080.38 ml  Output 780 ml  Net 1300.38 ml   Filed Weights   05/18/20 1750 05/19/20 1630  Weight: 85.3 kg 89.7 kg   Body mass index is 24.72 kg/m.   Physical Exam: General: Elderly and chronically ill appearing, in no acute distress. Head: Normocephalic, atraumatic, sclera non-icteric, no xanthomas, nares without discharge.  Neck: Negative for carotid bruits. JVD not elevated. Lungs: Mildly diminished breath sounds along the bilateral bases. Breathing is unlabored. Heart: Irregularly irregular with S1 S2. No murmurs, rubs, or gallops appreciated. Abdomen: Soft, non-tender, non-distended with normoactive bowel sounds. No hepatomegaly. No rebound/guarding. No obvious abdominal masses. Msk:  Strength and tone appear normal for age. Extremities: No clubbing or cyanosis. No edema. Distal pedal pulses are 2+ and equal bilaterally. Neuro: Alert and oriented X 3. No facial asymmetry. No focal deficit. Moves all extremities spontaneously. Psych:  Responds to questions appropriately with a normal affect.   EKG:  The EKG was personally reviewed and demonstrates: nitial EKG on 7/24 showed NSR with 1st degree AV block, inferior TWI. Repeat EKG 7/26 - Afib, 67 bpm, inferior TWI Telemetry:  Telemetry was personally reviewed and demonstrates: Admitted with sinus rhythm with first degree AV block. Now with Afib with controlled ventricular response starting on 7/26  Weights: Filed Weights   05/18/20 1750 05/19/20 1630  Weight: 85.3 kg 89.7 kg    Relevant CV Studies:  Nuclear stress test 06/2018: Impressions:  - Normal myocardial perfusion study  - No evidence for significant ischemia or scar isnoted.  - Post stress: Global systolic function  is hyperdynamic. The ejection  fraction was greater than 65%.  - No significant coronary calcifications were noted on the attenuation CT  __________  Nuclear stress test 01/2016: Impressions:  - Normal myocardial perfusion study  - No evidence for significant ischemia orscar is noted.  - Post stress: Global systolic function is hyperdynamic. The ejection  fraction was greater than 65%.  -  No significant coronary calcifications were noted on the attenuation CT  - Sensitivity and specificity of this test are reduced by the noted  attenuation (arm imaged at patient's side)     Laboratory Data:  Chemistry Recent Labs  Lab 05/20/20 1744 05/20/20 1744 05/20/20 2302 05/21/20 0121 05/21/20 0318 05/21/20 0457 05/21/20 0824  NA 120*   < > 119*   < > 123* 124* 122*  K 4.0  --  4.1  --   --  4.0  --   CL 93*  --  91*  --   --  98  --   CO2 18*  --  19*  --   --  17*  --   GLUCOSE 157*  --  182*  --   --  140*  --   BUN 43*  --  46*  --   --  44*  --   CREATININE 1.73*  --  1.73*  --   --  1.59*  --   CALCIUM 7.8*  --  7.9*  --   --  7.6*  --   GFRNONAA 38*  --  38*  --   --  42*  --   GFRAA 43*  --  43*  --   --  48*  --   ANIONGAP 9  --  9  --   --  9  --    < > = values in this interval not displayed.    No results for input(s): PROT, ALBUMIN, AST, ALT, ALKPHOS, BILITOT in the last 168 hours. Hematology Recent Labs  Lab 05/18/20 1754 05/19/20 0048 05/19/20 0628  WBC 19.8* 16.9* 14.7*  RBC 4.11* 4.05* 4.03*  HGB 13.2 12.9* 12.8*  HCT 35.7* 35.9* 35.5*  MCV 86.9 88.6 88.1  MCH 32.1 31.9 31.8  MCHC 37.0* 35.9 36.1*  RDW 12.8 12.7 12.6  PLT 281 252 227   Cardiac EnzymesNo results for input(s): TROPONINI in the last 168 hours. No results for input(s): TROPIPOC in the last 168 hours.  BNPNo results for input(s): BNP, PROBNP in the last 168 hours.  DDimer No results for input(s): DDIMER in the last 168 hours.  Radiology/Studies:  CT ABDOMEN PELVIS WO CONTRAST  Result  Date: 05/18/2020 IMPRESSION: 1. New mild hazy fat stranding about the pancreas, concerning for acute pancreatitis. Correlation with lipase is recommended. 2. New trace bilateral pleural effusions, right greater than left, with adjacent bibasilar atelectasis. 3. Cardiomegaly. 4. Moderate-sized hiatal hernia. 5. Distended urinary bladder. 6. New free fluid in the abdomen and pelvis of unknown clinical significance. Aortic Atherosclerosis (ICD10-I70.0). Electronically Signed   By: Constance Holster M.D.   On: 05/18/2020 22:02   DG Abd 1 View  Result Date: 05/20/2020 IMPRESSION: No radiographic explanation for abdominal pain on this single portable supine view. Electronically Signed   By: Keith Rake M.D.   On: 05/20/2020 19:47   DG Chest Port 1 View  Result Date: 05/19/2020 IMPRESSION: 1. Cardiomegaly with trace bilateral pleural effusions. 2. Bibasilar airspace opacities are favored to represent atelectasis. Superimposed infiltrate is not excluded. Electronically Signed   By: Constance Holster M.D.   On: 05/19/2020 01:17    Assessment and Plan:   1. New onset Afib: -Controlled ventricular rates without rate controlling medications  -Likely in the setting of his acute illness -TSH normal this admission -Potassium 4.0 -Check echo -He fell last week with noted head contusion -CHADS2VASc 4 (HTN, age x2, vascular disease)  -Stop ASA -Start Xarelto 15  mg q dinner -Estimated Creatinine Clearance: 47.2 mL/min (A) (by C-G formula based on SCr of 1.59 mg/dL (H)).  2. HTN: -Blood pressure mildly soft this morning -Amlodipine held  3. HLD: -LDL 108 from 07/2019 -Lipitor  4. Symptomatic hyponatremia: -Improving with hypertonic saline, though remains low -Unsure if there is a role of Tolvaptan   -Management per IM -Nephrology on board   5. AKI: -Likely in the setting of poor PO intake -Baseline SCr appears to be around 0.8-0.9 -Improving -Hydration   6. Constipation: -Patient's  main complaint this morning -Appears to be an ongoing issue without prior revealing work up -Per primary   7. Anemia: -Stable   For questions or updates, please contact [place] Please consult [email or web address] for contact info under Cardiology/STEMI.   Signed, Christell Faith, PA-C [place] Pager: [phone] 05/21/2020, 9:48 AM

## 2020-05-21 NOTE — Progress Notes (Signed)
MEDICATION RELATED CONSULT NOTE - INITIAL   Pharmacy Consult for 3% saline Indication: hyponatremia  Allergies  Allergen Reactions  . Ace Inhibitors Swelling    Lip swelling Lip swelling Lip swelling   . Prednisone Hives    Patient Measurements: Height: 6\' 3"  (190.5 cm) Weight: 89.7 kg (197 lb 12 oz) IBW/kg (Calculated) : 84.5  Vital Signs: Temp: 98.4 F (36.9 C) (07/26 1900) Temp Source: Oral (07/26 1900) BP: 96/60 (07/27 0200) Pulse Rate: 83 (07/27 0200) Intake/Output from previous day: 07/26 0701 - 07/27 0700 In: 2007.3 [P.O.:720; I.V.:1187.3; IV Piggyback:100] Out: 700 [Urine:700] Intake/Output from this shift: Total I/O In: -  Out: 475 [Urine:475]  Labs: Recent Labs    05/18/20 1754 05/18/20 1754 05/18/20 1847 05/19/20 0048 05/19/20 0048 05/19/20 0628 05/19/20 0938 05/20/20 1208 05/20/20 1744 05/20/20 2302  WBC 19.8*  --   --  16.9*  --  14.7*  --   --   --   --   HGB 13.2  --   --  12.9*  --  12.8*  --   --   --   --   HCT 35.7*  --   --  35.9*  --  35.5*  --   --   --   --   PLT 281  --   --  252  --  227  --   --   --   --   CREATININE 2.60*   < >  --  2.18*   < > 2.00*   < > 1.71* 1.73* 1.73*  LABCREA  --   --  170  --   --   --   --   --   --   --    < > = values in this interval not displayed.   Estimated Creatinine Clearance: 43.4 mL/min (A) (by C-G formula based on SCr of 1.73 mg/dL (H)).   Microbiology: Recent Results (from the past 720 hour(s))  Urine Culture     Status: None   Collection Time: 05/18/20  6:47 PM   Specimen: Urine, Random  Result Value Ref Range Status   Specimen Description   Final    URINE, RANDOM Performed at [facility], [address] [place], [place] [address]    Special Requests   Final    NONE Performed at [facility], [address], [place], [place] [address]    Culture   Final    NO GROWTH Performed at [facility], [place] [address] [place], [place] [address]    Report  Status 05/20/2020 FINAL  Final  Blood culture (routine x 2)     Status: None (Preliminary result)   Collection Time: 05/18/20  9:03 PM   Specimen: BLOOD  Result Value Ref Range Status   Specimen Description BLOOD RIGHT ANTECUBITAL  Final   Special Requests   Final    BOTTLES DRAWN AEROBIC AND ANAEROBIC Blood Culture adequate volume   Culture   Final    NO GROWTH 2 DAYS Performed at [facility], [address] [place], [place] [address]    Report Status PENDING  Incomplete  Blood culture (routine x 2)     Status: None (Preliminary result)   Collection Time: 05/18/20  9:03 PM   Specimen: BLOOD  Result Value Ref Range Status   Specimen Description BLOOD BLOOD RIGHT WRIST  Final   Special Requests   Final    BOTTLES DRAWN AEROBIC AND ANAEROBIC Blood Culture  results may not be optimal due to an inadequate volume of blood received in culture bottles   Culture   Final    NO GROWTH 2 DAYS Performed at [facility], [address] [place], [place] [address]    Report Status PENDING  Incomplete  SARS Coronavirus 2 by RT PCR (hospital order, performed in [facility] hospital lab) Nasopharyngeal Nasopharyngeal Swab     Status: None   Collection Time: 05/18/20  9:28 PM   Specimen: Nasopharyngeal Swab  Result Value Ref Range Status   SARS Coronavirus 2 NEGATIVE NEGATIVE Final    Comment: (NOTE) SARS-CoV-2 target nucleic acids are NOT DETECTED.  The SARS-CoV-2 RNA is generally detectable in upper and lower respiratory specimens during the acute phase of infection. The lowest concentration of SARS-CoV-2 viral copies this assay can detect is 250 copies / mL. A negative result does not preclude SARS-CoV-2 infection and should not be used as the sole basis for treatment or other patient management decisions.  A negative result may occur with improper specimen collection / handling, submission of specimen other than nasopharyngeal swab, presence of viral mutation(s) within  the areas targeted by this assay, and inadequate number of viral copies (<250 copies / mL). A negative result must be combined with clinical observations, patient history, and epidemiological information.  Fact Sheet for Patients:   StrictlyIdeas.no  Fact Sheet for Healthcare Providers: BankingDealers.co.za  This test is not yet approved or  cleared by the [place] FDA and has been authorized for detection and/or diagnosis of SARS-CoV-2 by FDA under an Emergency Use Authorization (EUA).  This EUA will remain in effect (meaning this test can be used) for the duration of the COVID-19 declaration under Section 564(b)(1) of the Act, 21 U.S.C. section 360bbb-3(b)(1), unless the authorization is terminated or revoked sooner.  Performed at [facility], [address] [place], [place] [address]   MRSA PCR Screening     Status: None   Collection Time: 05/19/20  4:31 PM   Specimen: Nasopharyngeal  Result Value Ref Range Status   MRSA by PCR NEGATIVE NEGATIVE Final    Comment:        The GeneXpert MRSA Assay (FDA approved for NASAL specimens only), is one component of a comprehensive MRSA colonization surveillance program. It is not intended to diagnose MRSA infection nor to guide or monitor treatment for MRSA infections. Performed at [facility], [address], [place], [place] [address]     Medical History: Past Medical History:  Diagnosis Date  . Arthritis   . GERD (gastroesophageal reflux disease)   . Hypertension   . Rheumatoid arthritis ([place])   . Thyroid disease     Medications:  Scheduled:  . amLODipine  10 mg Oral Daily  . aspirin  81 mg Oral Daily  . atorvastatin  40 mg Oral Daily  . Chlorhexidine Gluconate Cloth  6 each Topical Daily  . docusate sodium  100 mg Oral BID  . enoxaparin (LOVENOX) injection  40 mg Subcutaneous Q24H  . feeding supplement (ENSURE ENLIVE)  237 mL Oral BID BM   . gabapentin  200 mg Oral QHS  . levothyroxine  88 mcg Oral QAC breakfast  . multivitamin with minerals  1 tablet Oral Daily  . pantoprazole  40 mg Oral Daily  . polyethylene glycol  17 g Oral BID  . tamsulosin  0.4 mg Oral Daily    Assessment: Patient w/ h/o HTN, hypothyroidism, RA, GERD admitted for weakness w/ nausea  and abdominal pain found to be in hyponatremia w/ an initially Na of 116 mEq/L. Patient is being started on 3% saline for further correction of sodium.   Goal of Therapy:  Na 135 - 145 w/ a rise no more than 8 to 12 mEq/L in 24 hours.  Plan:  07/27 @ 0300 Na 123 mEq/L trending appropriately, will continue to monitor w/ q2h labs per nephro.  Tobie Lords, PharmD, BCPS Clinical Pharmacist 05/21/2020,4:54 AM

## 2020-05-21 NOTE — Progress Notes (Signed)
MEDICATION RELATED CONSULT NOTE - INITIAL   Pharmacy Consult for 3% saline Indication: hyponatremia  Allergies  Allergen Reactions   Ace Inhibitors Swelling    Lip swelling Lip swelling Lip swelling    Prednisone Hives    Patient Measurements: Height: 6\' 3"  (190.5 cm) Weight: 89.7 kg (197 lb 12 oz) IBW/kg (Calculated) : 84.5  Vital Signs: BP: 112/67 (07/27 0520) Pulse Rate: 97 (07/27 0520) Intake/Output from previous day: 07/26 0701 - 07/27 0700 In: 2146.8 [P.O.:720; I.V.:1326.8; IV Piggyback:100] Out: 700 [Urine:700] Intake/Output from this shift: No intake/output data recorded.  Labs: Recent Labs    05/18/20 1754 05/18/20 1754 05/18/20 1847 05/19/20 0048 05/19/20 0048 05/19/20 0628 05/19/20 0938 05/20/20 1744 05/20/20 2302 05/21/20 0457  WBC 19.8*  --   --  16.9*  --  14.7*  --   --   --   --   HGB 13.2  --   --  12.9*  --  12.8*  --   --   --   --   HCT 35.7*  --   --  35.9*  --  35.5*  --   --   --   --   PLT 281  --   --  252  --  227  --   --   --   --   CREATININE 2.60*   < >  --  2.18*   < > 2.00*   < > 1.73* 1.73* 1.59*  LABCREA  --   --  170  --   --   --   --   --   --   --    < > = values in this interval not displayed.   Estimated Creatinine Clearance: 47.2 mL/min (A) (by C-G formula based on SCr of 1.59 mg/dL (H)).   Microbiology: Recent Results (from the past 720 hour(s))  Urine Culture     Status: None   Collection Time: 05/18/20  6:47 PM   Specimen: Urine, Random  Result Value Ref Range Status   Specimen Description   Final    URINE, RANDOM Performed at [facility], [address], [place], [place] [address]    Special Requests   Final    NONE Performed at [facility], [address] [place], [place] [address]    Culture   Final    NO GROWTH Performed at [facility], [place] [address] [place], [place] [address]    Report Status 05/20/2020 FINAL  Final  Blood culture (routine x 2)     Status: None  (Preliminary result)   Collection Time: 05/18/20  9:03 PM   Specimen: BLOOD  Result Value Ref Range Status   Specimen Description BLOOD RIGHT ANTECUBITAL  Final   Special Requests   Final    BOTTLES DRAWN AEROBIC AND ANAEROBIC Blood Culture adequate volume   Culture   Final    NO GROWTH 2 DAYS Performed at [facility], [address], [place], [place] [address]    Report Status PENDING  Incomplete  Blood culture (routine x 2)     Status: None (Preliminary result)   Collection Time: 05/18/20  9:03 PM   Specimen: BLOOD  Result Value Ref Range Status   Specimen Description BLOOD BLOOD RIGHT WRIST  Final   Special Requests   Final    BOTTLES DRAWN AEROBIC AND ANAEROBIC Blood Culture results may not be optimal due to an inadequate volume of blood received in culture bottles  Culture   Final    NO GROWTH 2 DAYS Performed at [facility], [address] [place], [place] [address]    Report Status PENDING  Incomplete  SARS Coronavirus 2 by RT PCR (hospital order, performed in [facility] hospital lab) Nasopharyngeal Nasopharyngeal Swab     Status: None   Collection Time: 05/18/20  9:28 PM   Specimen: Nasopharyngeal Swab  Result Value Ref Range Status   SARS Coronavirus 2 NEGATIVE NEGATIVE Final    Comment: (NOTE) SARS-CoV-2 target nucleic acids are NOT DETECTED.  The SARS-CoV-2 RNA is generally detectable in upper and lower respiratory specimens during the acute phase of infection. The lowest concentration of SARS-CoV-2 viral copies this assay can detect is 250 copies / mL. A negative result does not preclude SARS-CoV-2 infection and should not be used as the sole basis for treatment or other patient management decisions.  A negative result may occur with improper specimen collection / handling, submission of specimen other than nasopharyngeal swab, presence of viral mutation(s) within the areas targeted by this assay, and inadequate number of viral  copies (<250 copies / mL). A negative result must be combined with clinical observations, patient history, and epidemiological information.  Fact Sheet for Patients:   StrictlyIdeas.no  Fact Sheet for Healthcare Providers: BankingDealers.co.za  This test is not yet approved or  cleared by the [place] FDA and has been authorized for detection and/or diagnosis of SARS-CoV-2 by FDA under an Emergency Use Authorization (EUA).  This EUA will remain in effect (meaning this test can be used) for the duration of the COVID-19 declaration under Section 564(b)(1) of the Act, 21 U.S.C. section 360bbb-3(b)(1), unless the authorization is terminated or revoked sooner.  Performed at [facility], [address] [place], [place] [address]   MRSA PCR Screening     Status: None   Collection Time: 05/19/20  4:31 PM   Specimen: Nasopharyngeal  Result Value Ref Range Status   MRSA by PCR NEGATIVE NEGATIVE Final    Comment:        The GeneXpert MRSA Assay (FDA approved for NASAL specimens only), is one component of a comprehensive MRSA colonization surveillance program. It is not intended to diagnose MRSA infection nor to guide or monitor treatment for MRSA infections. Performed at [facility], [address] [place], [place] [address]     Medical History: Past Medical History:  Diagnosis Date   Arthritis    GERD (gastroesophageal reflux disease)    Hypertension    Rheumatoid arthritis ([place])    Thyroid disease     Medications:  Scheduled:   amLODipine  10 mg Oral Daily   aspirin  81 mg Oral Daily   atorvastatin  40 mg Oral Daily   Chlorhexidine Gluconate Cloth  6 each Topical Daily   docusate sodium  100 mg Oral BID   enoxaparin (LOVENOX) injection  40 mg Subcutaneous Q24H   feeding supplement (ENSURE ENLIVE)  237 mL Oral BID BM   gabapentin  200 mg Oral QHS   levothyroxine  88 mcg Oral QAC  breakfast   multivitamin with minerals  1 tablet Oral Daily   pantoprazole  40 mg Oral Daily   polyethylene glycol  17 g Oral BID   tamsulosin  0.4 mg Oral Daily    Assessment: Patient w/ h/o HTN, hypothyroidism, RA, GERD admitted for weakness w/ nausea and abdominal pain found to be in hyponatremia w/ an initially Na of 116 mEq/L. Patient is being  started on 3% saline for further correction of sodium.   Goal of Therapy:  Na 135 - 145 w/ a rise no more than 8 to 12 mEq/L in 24 hours.  Plan:  07/27 @ 0500 Na 124 mEq/L trending appropriately, will continue to monitor w/ q2h labs per nephro.  Tobie Lords, PharmD, BCPS Clinical Pharmacist 05/21/2020,7:18 AM

## 2020-05-22 DIAGNOSIS — I480 Paroxysmal atrial fibrillation: Secondary | ICD-10-CM

## 2020-05-22 DIAGNOSIS — I4819 Other persistent atrial fibrillation: Secondary | ICD-10-CM

## 2020-05-22 LAB — CBC
HCT: 31.7 % — ABNORMAL LOW (ref 39.0–52.0)
Hemoglobin: 11.5 g/dL — ABNORMAL LOW (ref 13.0–17.0)
MCH: 32.4 pg (ref 26.0–34.0)
MCHC: 36.3 g/dL — ABNORMAL HIGH (ref 30.0–36.0)
MCV: 89.3 fL (ref 80.0–100.0)
Platelets: 266 10*3/uL (ref 150–400)
RBC: 3.55 MIL/uL — ABNORMAL LOW (ref 4.22–5.81)
RDW: 13.3 % (ref 11.5–15.5)
WBC: 16 10*3/uL — ABNORMAL HIGH (ref 4.0–10.5)
nRBC: 0.1 % (ref 0.0–0.2)

## 2020-05-22 LAB — BASIC METABOLIC PANEL
Anion gap: 7 (ref 5–15)
BUN: 42 mg/dL — ABNORMAL HIGH (ref 8–23)
CO2: 18 mmol/L — ABNORMAL LOW (ref 22–32)
Calcium: 8 mg/dL — ABNORMAL LOW (ref 8.9–10.3)
Chloride: 104 mmol/L (ref 98–111)
Creatinine, Ser: 1.43 mg/dL — ABNORMAL HIGH (ref 0.61–1.24)
GFR calc Af Amer: 55 mL/min — ABNORMAL LOW (ref >60)
GFR calc non Af Amer: 47 mL/min — ABNORMAL LOW (ref >60)
Glucose, Bld: 138 mg/dL — ABNORMAL HIGH (ref 70–99)
Potassium: 4.6 mmol/L (ref 3.5–5.1)
Sodium: 129 mmol/L — ABNORMAL LOW (ref 135–145)

## 2020-05-22 LAB — MAGNESIUM: Magnesium: 2.5 mg/dL — ABNORMAL HIGH (ref 1.7–2.4)

## 2020-05-22 LAB — ECHOCARDIOGRAM COMPLETE
AR max vel: 2.25 cm2
AV Area VTI: 2.86 cm2
AV Area mean vel: 2.43 cm2
AV Mean grad: 7 mmHg
AV Peak grad: 13.5 mmHg
Ao pk vel: 1.84 m/s
Area-P 1/2: 4.17 cm2
Height: 75 in
S' Lateral: 3.57 cm
Weight: 3164.04 oz

## 2020-05-22 LAB — SODIUM
Sodium: 131 mmol/L — ABNORMAL LOW (ref 135–145)
Sodium: 129 mmol/L — ABNORMAL LOW (ref 135–145)
Sodium: 133 mmol/L — ABNORMAL LOW (ref 135–145)

## 2020-05-22 LAB — PROCALCITONIN: Procalcitonin: 0.16 ng/mL

## 2020-05-22 MED ORDER — LACTULOSE 10 GM/15ML PO SOLN
30.0000 g | Freq: Three times a day (TID) | ORAL | Status: DC
  Administered 2020-05-22 – 2020-05-23 (×3): 30 g via ORAL
  Filled 2020-05-22 (×4): qty 60

## 2020-05-22 MED ORDER — SODIUM CHLORIDE 3 % IV SOLN
INTRAVENOUS | Status: DC
  Filled 2020-05-22 (×2): qty 500

## 2020-05-22 MED ORDER — PEG 3350-KCL-NA BICARB-NACL 420 G PO SOLR
2000.0000 mL | Freq: Once | ORAL | Status: AC
  Administered 2020-05-22: 2000 mL via ORAL
  Filled 2020-05-22: qty 4000

## 2020-05-22 MED ORDER — SODIUM CHLORIDE 1 G PO TABS
1.0000 g | ORAL_TABLET | Freq: Two times a day (BID) | ORAL | Status: DC
  Administered 2020-05-22 – 2020-05-24 (×4): 1 g via ORAL
  Filled 2020-05-22 (×5): qty 1

## 2020-05-22 NOTE — Progress Notes (Signed)
[address] [place], [place] [address] Phone [phone]. Fax [phone]  Date: 05/22/2020                  Patient Name:  Arthur Romero  MRN: [id number]  DOB: [birth year]  Age / Sex: 79 y.o., male         PCP: Olin Hauser, DO                 Service Requesting Consult: IM/ Sharen Hones, MD                 Reason for Consult: ARF            History of Present Illness: Patient is a 79 y.o. male with medical problems of HTN, Gout, GERD, hypothyroidism, rheumatoid arthritis, who was admitted to [facility] on 05/18/2020 for Dehydration [E86.0] Hyponatremia [E87.1] Weakness [R53.1] Abdominal pain [R10.9] Sepsis ([place]) [A41.9]  Nephrology consult for Low Sodium  Brought  to ER by neighbor for weakness, fever, cough, nausea for 3 days prior. Fall 1 day prior to admission.  Started on 3% hypertonic saline at the time of admission  Continues to complain of severe hypertension Serum sodium improved to 131 this morning No shortness of breath Able to eat but gets full quickly because of constipation.     Current medications: Current Facility-Administered Medications  Medication Dose Route Frequency Provider Last Rate Last Admin  . acetaminophen (TYLENOL) tablet 650 mg  650 mg Oral Q6H PRN Athena Masse, MD   650 mg at 05/19/20 0112   Or  . acetaminophen (TYLENOL) suppository 650 mg  650 mg Rectal Q6H PRN Athena Masse, MD      . atorvastatin (LIPITOR) tablet 40 mg  40 mg Oral Daily Athena Masse, MD   40 mg at 05/22/20 0926  . Chlorhexidine Gluconate Cloth 2 % PADS 6 each  6 each Topical Daily Mariel Aloe, MD   6 each at 05/21/20 1414  . docusate sodium (COLACE) capsule 100 mg  100 mg Oral BID Athena Masse, MD   100 mg at 05/22/20 0926  . feeding supplement (ENSURE ENLIVE) (ENSURE ENLIVE) liquid 237 mL  237 mL Oral BID BM Mariel Aloe, MD   237 mL at 05/22/20 0926  . gabapentin (NEURONTIN) capsule 200 mg  200 mg Oral QHS Mariel Aloe,  MD   200 mg at 05/21/20 2102  . HYDROcodone-acetaminophen (NORCO/VICODIN) 5-325 MG per tablet 1-2 tablet  1-2 tablet Oral Q4H PRN Athena Masse, MD      . lactulose (CHRONULAC) 10 GM/15ML solution 20 g  20 g Oral BID Mariel Aloe, MD   20 g at 05/22/20 0926  . levothyroxine (SYNTHROID) tablet 88 mcg  88 mcg Oral QAC breakfast Athena Masse, MD   88 mcg at 05/22/20 0500  . morphine 2 MG/ML injection 2 mg  2 mg Intravenous Q2H PRN Athena Masse, MD      . multivitamin with minerals tablet 1 tablet  1 tablet Oral Daily Mariel Aloe, MD   1 tablet at 05/22/20 0926  . ondansetron (ZOFRAN) tablet 4 mg  4 mg Oral Q6H PRN Athena Masse, MD       Or  . ondansetron [facility]) injection 4 mg  4 mg Intravenous Q6H PRN Athena Masse, MD   4 mg at 05/21/20 0901  . pantoprazole (PROTONIX) EC tablet 40 mg  40 mg Oral Daily Athena Masse, MD  40 mg at 05/22/20 0926  . polyethylene glycol (MIRALAX / GLYCOLAX) packet 17 g  17 g Oral BID Mariel Aloe, MD   17 g at 05/22/20 0926  . Rivaroxaban (XARELTO) tablet 15 mg  15 mg Oral Q supper Christell Faith M, PA-C   15 mg at 05/21/20 1717  . sodium chloride (hypertonic) 3 % solution   Intravenous Continuous Lang Snow, NP 40 mL/hr at 05/22/20 0700 Rate Verify at 05/22/20 0700  . tamsulosin (FLOMAX) capsule 0.4 mg  0.4 mg Oral Daily Judd Gaudier V, MD   0.4 mg at 05/22/20 0973      Vital Signs: Blood pressure (!) 96/57, pulse (!) 25, temperature 98 F (36.7 C), resp. rate 19, height 6\' 3"  (1.905 m), weight 89.7 kg, SpO2 91 %.   Intake/Output Summary (Last 24 hours) at 05/22/2020 0959 Last data filed at 05/22/2020 0700 Gross per 24 hour  Intake 528.17 ml  Output 1010 ml  Net -481.83 ml    Weight trends: Filed Weights   05/18/20 1750 05/19/20 1630  Weight: 85.3 kg 89.7 kg    Physical Exam: General:  No acute distress, laying in the bed  HEENT  anicteric, moist oral mucous membranes  Neck:  Supple, no JVD, no masses  Lungs:   Normal breathing effort, clear to auscultation  Heart::  No rub or gallop  Abdomen:  Soft, nontender  Extremities:  Trace edema  Neurologic:  Alert, able to answer questions  Skin:  Warm, dry       Lab results: Basic Metabolic Panel: Recent Labs  Lab 05/20/20 2302 05/21/20 0121 05/21/20 0457 05/21/20 0824 05/21/20 2302 05/22/20 0353 05/22/20 0756  NA 119*   < > 124*   < > 125* 129* 131*  K 4.1  --  4.0  --   --  4.6  --   CL 91*  --  98  --   --  104  --   CO2 19*  --  17*  --   --  18*  --   GLUCOSE 182*  --  140*  --   --  138*  --   BUN 46*  --  44*  --   --  42*  --   CREATININE 1.73*  --  1.59*  --   --  1.43*  --   CALCIUM 7.9*  --  7.6*  --   --  8.0*  --   MG  --   --   --   --   --   --  2.5*   < > = values in this interval not displayed.    Liver Function Tests: No results for input(s): AST, ALT, ALKPHOS, BILITOT, PROT, ALBUMIN in the last 168 hours. Recent Labs  Lab 05/18/20 1754  LIPASE 29   No results for input(s): AMMONIA in the last 168 hours.  CBC: Recent Labs  Lab 05/21/20 1212 05/22/20 0353  WBC 17.0* 16.0*  HGB 12.3* 11.5*  HCT 33.8* 31.7*  MCV 88.9 89.3  PLT 259 266    Cardiac Enzymes: No results for input(s): CKTOTAL, TROPONINI in the last 168 hours.  BNP: Invalid input(s): POCBNP  CBG: Recent Labs  Lab 05/19/20 1630  GLUCAP 177*    Microbiology: Recent Results (from the past 720 hour(s))  Urine Culture     Status: None   Collection Time: 05/18/20  6:47 PM   Specimen: Urine, Random  Result Value Ref Range Status   Specimen Description  Final    URINE, RANDOM Performed at [facility], [address] [place], [place] [address]    Special Requests   Final    NONE Performed at [facility], [address], [place], [place] [address]    Culture   Final    NO GROWTH Performed at [facility], [place] [address] [place], [place] [address]    Report Status 05/20/2020 FINAL  Final  Blood culture  (routine x 2)     Status: None (Preliminary result)   Collection Time: 05/18/20  9:03 PM   Specimen: BLOOD  Result Value Ref Range Status   Specimen Description BLOOD RIGHT ANTECUBITAL  Final   Special Requests   Final    BOTTLES DRAWN AEROBIC AND ANAEROBIC Blood Culture adequate volume   Culture   Final    NO GROWTH 4 DAYS Performed at [facility], [address], [place], [place] [address]    Report Status PENDING  Incomplete  Blood culture (routine x 2)     Status: None (Preliminary result)   Collection Time: 05/18/20  9:03 PM   Specimen: BLOOD  Result Value Ref Range Status   Specimen Description BLOOD BLOOD RIGHT WRIST  Final   Special Requests   Final    BOTTLES DRAWN AEROBIC AND ANAEROBIC Blood Culture results may not be optimal due to an inadequate volume of blood received in culture bottles   Culture   Final    NO GROWTH 4 DAYS Performed at [facility], [address] [place], [place] [address]    Report Status PENDING  Incomplete  SARS Coronavirus 2 by RT PCR (hospital order, performed in [place] hospital lab) Nasopharyngeal Nasopharyngeal Swab     Status: None   Collection Time: 05/18/20  9:28 PM   Specimen: Nasopharyngeal Swab  Result Value Ref Range Status   SARS Coronavirus 2 NEGATIVE NEGATIVE Final    Comment: (NOTE) SARS-CoV-2 target nucleic acids are NOT DETECTED.  The SARS-CoV-2 RNA is generally detectable in upper and lower respiratory specimens during the acute phase of infection. The lowest concentration of SARS-CoV-2 viral copies this assay can detect is 250 copies / mL. A negative result does not preclude SARS-CoV-2 infection and should not be used as the sole basis for treatment or other patient management decisions.  A negative result may occur with improper specimen collection / handling, submission of specimen other than nasopharyngeal swab, presence of viral mutation(s) within the areas targeted by this assay, and  inadequate number of viral copies (<250 copies / mL). A negative result must be combined with clinical observations, patient history, and epidemiological information.  Fact Sheet for Patients:   StrictlyIdeas.no  Fact Sheet for Healthcare Providers: BankingDealers.co.za  This test is not yet approved or  cleared by the [place] FDA and has been authorized for detection and/or diagnosis of SARS-CoV-2 by FDA under an Emergency Use Authorization (EUA).  This EUA will remain in effect (meaning this test can be used) for the duration of the COVID-19 declaration under Section 564(b)(1) of the Act, 21 U.S.C. section 360bbb-3(b)(1), unless the authorization is terminated or revoked sooner.  Performed at [facility], [address] [place], [place] [address]   MRSA PCR Screening     Status: None   Collection Time: 05/19/20  4:31 PM   Specimen: Nasopharyngeal  Result Value Ref Range Status   MRSA by PCR NEGATIVE NEGATIVE Final    Comment:  The GeneXpert MRSA Assay (FDA approved for NASAL specimens only), is one component of a comprehensive MRSA colonization surveillance program. It is not intended to diagnose MRSA infection nor to guide or monitor treatment for MRSA infections. Performed at [facility], [address] [place], [place] [address]      Coagulation Studies: No results for input(s): LABPROT, INR in the last 72 hours.  Urinalysis: No results for input(s): COLORURINE, LABSPEC, PHURINE, GLUCOSEU, HGBUR, BILIRUBINUR, KETONESUR, PROTEINUR, UROBILINOGEN, NITRITE, LEUKOCYTESUR in the last 72 hours.  Invalid input(s): APPERANCEUR      Imaging: DG Abd 1 View  Result Date: 05/20/2020 CLINICAL DATA:  Abdominal pain. EXAM: ABDOMEN - 1 VIEW COMPARISON:  CT 2 days ago 05/18/2020 FINDINGS: Portable supine view of the lower abdomen and pelvis. No evidence of free intra-abdominal air on this  limited view. No bowel dilatation to suggest obstruction. Small volume of stool in the proximal colon. Tacks in the anterior abdominal wall from prior hernia repair. No radiopaque calculi or abnormal soft tissue calcifications. Splenic granuloma on CT not included in the field of view. IMPRESSION: No radiographic explanation for abdominal pain on this single portable supine view. Electronically Signed   By: Keith Rake M.D.   On: 05/20/2020 19:47   ECHOCARDIOGRAM COMPLETE  Result Date: 05/22/2020    ECHOCARDIOGRAM REPORT   Patient Name:   Arthur Romero Date of Exam: 05/21/2020 Medical Rec #:  [id number]         Height:       75.0 in Accession #:    [id number]        Weight:       197.8 lb Date of Birth:  1941/03/05        BSA:          2.184 m Patient Age:    10 years          BP:           104/56 mmHg Patient Gender: M                 HR:           95 bpm. Exam Location:  [facility] Procedure: 2D Echo, Color Doppler and Cardiac Doppler Indications:     I48.91 Atrial Fibrillation  History:         Patient has no prior history of Echocardiogram examinations.                  Risk Factors:Hypertension.  Sonographer:     Charmayne Sheer RDCS (AE) Referring Phys:  [place] Diagnosing Phys: Ida Rogue MD  Sonographer Comments: Suboptimal apical window and no subcostal window. IMPRESSIONS  1. Left ventricular ejection fraction, by estimation, is 60 to 65%. The left ventricle has normal function. The left ventricle has no regional wall motion abnormalities. Left ventricular diastolic parameters are indeterminate.  2. Right ventricular systolic function is normal. The right ventricular size is normal. There is normal pulmonary artery systolic pressure.  3. Left atrial size was mildly dilated.  4. Mild to moderate mitral valve regurgitation.  5. Rhythm is atrial fibrillation FINDINGS  Left Ventricle: Left ventricular ejection fraction, by estimation, is 60 to 65%. The left ventricle has normal function. The  left ventricle has no regional wall motion abnormalities. The left ventricular internal cavity size was normal in size. There is  no left ventricular hypertrophy. Left ventricular diastolic parameters are indeterminate. Right Ventricle: The right ventricular size is normal. No increase in  right ventricular wall thickness. Right ventricular systolic function is normal. There is normal pulmonary artery systolic pressure. The tricuspid regurgitant velocity is 2.44 m/s, and  with an assumed right atrial pressure of 10 mmHg, the estimated right ventricular systolic pressure is 62.1 mmHg. Left Atrium: Left atrial size was mildly dilated. Right Atrium: Right atrial size was normal in size. Pericardium: There is no evidence of pericardial effusion. Mitral Valve: The mitral valve was not well visualized. Normal mobility of the mitral valve leaflets. Mild to moderate mitral valve regurgitation. No evidence of mitral valve stenosis. MV peak gradient, 8.2 mmHg. The mean mitral valve gradient is 3.0 mmHg. Tricuspid Valve: The tricuspid valve is not well visualized. Tricuspid valve regurgitation is mild . No evidence of tricuspid stenosis. Aortic Valve: The aortic valve is normal in structure. Aortic valve regurgitation is not visualized. Mild aortic valve sclerosis is present, with no evidence of aortic valve stenosis. Aortic valve mean gradient measures 7.0 mmHg. Aortic valve peak gradient measures 13.5 mmHg. Aortic valve area, by VTI measures 2.86 cm. Pulmonic Valve: The pulmonic valve was not well visualized. Pulmonic valve regurgitation is not visualized. No evidence of pulmonic stenosis. Aorta: The aortic root is normal in size and structure. Venous: The inferior vena cava is normal in size with greater than 50% respiratory variability, suggesting right atrial pressure of 3 mmHg. IAS/Shunts: No atrial level shunt detected by color flow Doppler.  LEFT VENTRICLE PLAX 2D LVIDd:         5.15 cm  Diastology LVIDs:         3.57  cm  LV e' lateral:   16.50 cm/s LV PW:         1.05 cm  LV E/e' lateral: 8.1 LV IVS:        0.74 cm  LV e' medial:    10.20 cm/s LVOT diam:     2.00 cm  LV E/e' medial:  13.2 LV SV:         74 LV SV Index:   34 LVOT Area:     3.14 cm  LEFT ATRIUM             Index LA diam:        4.50 cm 2.06 cm/m LA Vol (A2C):   71.2 ml 32.60 ml/m LA Vol (A4C):   56.0 ml 25.64 ml/m LA Biplane Vol: 64.8 ml 29.67 ml/m  AORTIC VALVE                    PULMONIC VALVE AV Area (Vmax):    2.25 cm     PV Vmax:       1.06 m/s AV Area (Vmean):   2.43 cm     PV Vmean:      70.100 cm/s AV Area (VTI):     2.86 cm     PV VTI:        0.158 m AV Vmax:           184.00 cm/s  PV Peak grad:  4.5 mmHg AV Vmean:          119.000 cm/s PV Mean grad:  2.0 mmHg AV VTI:            0.260 m AV Peak Grad:      13.5 mmHg AV Mean Grad:      7.0 mmHg LVOT Vmax:         132.00 cm/s LVOT Vmean:        92.000 cm/s LVOT VTI:  0.237 m LVOT/AV VTI ratio: 0.91  AORTA Ao Root diam: 3.50 cm MITRAL VALVE                TRICUSPID VALVE MV Area (PHT): 4.17 cm     TR Peak grad:   23.8 mmHg MV Peak grad:  8.2 mmHg     TR Vmax:        244.00 cm/s MV Mean grad:  3.0 mmHg MV Vmax:       1.43 m/s     SHUNTS MV Vmean:      76.3 cm/s    Systemic VTI:  0.24 m MV Decel Time: 182 msec     Systemic Diam: 2.00 cm MV E velocity: 134.33 cm/s Ida Rogue MD Electronically signed by Ida Rogue MD Signature Date/Time: 05/22/2020/6:51:00 AM    Final      Assessment & Plan: Pt is a 79 y.o.  male with HTN, Gout, GERD, hypothyroidism, rheumatoid arthritis, was admitted on 05/18/2020 with Dehydration [E86.0] Hyponatremia [E87.1] Weakness [R53.1] Abdominal pain [R10.9] Sepsis ([place]) [A41.9]   #Acute kidney injury   Baseline creatinine of 0.84 from April 26, 2020 Admission creatinine of 2.60.  Serum creatinine trend is improving as noted below Lab Results  Component Value Date   CREATININE 1.43 (H) 05/22/2020   CREATININE 1.59 (H) 05/21/2020   CREATININE 1.73  (H) 05/20/2020   # Hyponatremia Serum sodium level improved to 131 this morning Discontinue 3% saline and monitor         LOS: 3 Oshua Mccon[place] 7/28/20219:59 AM    Note: This note was prepared with Dragon dictation. Any transcription errors are unintentional

## 2020-05-22 NOTE — Progress Notes (Signed)
[place] for 3% saline Indication: hyponatremia   Assessment: Patient w/ h/o HTN, hypothyroidism, RA, GERD admitted for weakness w/ nausea and abdominal pain found to be in hyponatremia w/ an initially Na of 116 mEq/L. Patient is being started on 3% saline for further correction of sodium.   Goal of Therapy:  Na 135 - 145 w/ a rise no more than 8 to 12 mEq/L in 24 hours.  Plan:  07/27 @ 2300 Na 125 mEq/L. Up by 6 mEq/L in 24 hours which is appropriate. Will continue to monitor w/ q4h sodium checks and make recommendations in rate adjustment per sodium trends.   Tobie Lords, PharmD, BCPS Clinical Pharmacist 05/22/2020,2:12 AM

## 2020-05-22 NOTE — Progress Notes (Signed)
[place] for 3% saline Indication: hyponatremia   Assessment: Patient w/ h/o HTN, hypothyroidism, RA, GERD admitted for weakness w/ nausea and abdominal pain found to be in hyponatremia w/ an initially Na of 116 mEq/L. Patient is being started on 3% saline for further correction of sodium.   Goal of Therapy:  Na 135 - 145 w/ a rise no more than 8 to 12 mEq/L in 24 hours.  Plan:  07/28 @ 0400 Na 129 mEq/L. Up by 5 mEq/L in 24 hours which is appropriate. Will continue to monitor w/ q4h sodium checks and make recommendations in rate adjustment per sodium trends.   Tobie Lords, PharmD, BCPS Clinical Pharmacist 05/22/2020,6:21 AM

## 2020-05-22 NOTE — Progress Notes (Signed)
Progress Note  Patient Name: Arthur Romero Date of Encounter: 05/22/2020  Primary Cardiologist: Ida Rogue, MD  Subjective   Still bothered by constipation.  Notes occasional palpitations.  HRs trended 80's yesterday - 90's to low 100's this AM.  No chest pain or dyspnea.  Inpatient Medications    Scheduled Meds:  atorvastatin  40 mg Oral Daily   Chlorhexidine Gluconate Cloth  6 each Topical Daily   docusate sodium  100 mg Oral BID   feeding supplement (ENSURE ENLIVE)  237 mL Oral BID BM   gabapentin  200 mg Oral QHS   lactulose  20 g Oral BID   levothyroxine  88 mcg Oral QAC breakfast   multivitamin with minerals  1 tablet Oral Daily   pantoprazole  40 mg Oral Daily   polyethylene glycol  17 g Oral BID   Rivaroxaban  15 mg Oral Q supper   tamsulosin  0.4 mg Oral Daily   Continuous Infusions:  sodium chloride (hypertonic) 40 mL/hr at 05/22/20 0700   PRN Meds: acetaminophen **OR** acetaminophen, HYDROcodone-acetaminophen, morphine injection, ondansetron **OR** ondansetron (ZOFRAN) IV   Vital Signs    Vitals:   05/22/20 0200 05/22/20 0400 05/22/20 0600 05/22/20 0700  BP: 106/76 (!) 106/64 (!) 96/57   Pulse: 81  92 (!) 25  Resp: 20 20 15 19   Temp:  98 F (36.7 C)    TempSrc:      SpO2: 94% 94% 95% 91%  Weight:      Height:        Intake/Output Summary (Last 24 hours) at 05/22/2020 0821 Last data filed at 05/22/2020 0700 Gross per 24 hour  Intake 528.17 ml  Output 1010 ml  Net -481.83 ml   Filed Weights   05/18/20 1750 05/19/20 1630  Weight: 85.3 kg 89.7 kg    Physical Exam   GEN: Well nourished, well developed, in no acute distress.  HEENT: Grossly normal.  Neck: Supple, no JVD, carotid bruits, or masses. Cardiac: IR, IR, no murmurs, rubs, or gallops. No clubbing, cyanosis, edema.  Radials/DP/PT 2+ and equal bilaterally.  Respiratory:  Respirations regular and unlabored, diminished breath sounds @ bases. GI: semi-firm, nontender,  BS + x 4. MS: no deformity or atrophy. Skin: warm and dry, no rash. Neuro:  Strength and sensation are intact. Psych: AAOx3.  Normal affect.  Labs    Chemistry Recent Labs  Lab 05/20/20 2302 05/21/20 0121 05/21/20 0457 05/21/20 0824 05/21/20 1940 05/21/20 2302 05/22/20 0353  NA 119*   < > 124*   < > 125* 125* 129*  K 4.1  --  4.0  --   --   --  4.6  CL 91*  --  98  --   --   --  104  CO2 19*  --  17*  --   --   --  18*  GLUCOSE 182*  --  140*  --   --   --  138*  BUN 46*  --  44*  --   --   --  42*  CREATININE 1.73*  --  1.59*  --   --   --  1.43*  CALCIUM 7.9*  --  7.6*  --   --   --  8.0*  GFRNONAA 38*  --  42*  --   --   --  47*  GFRAA 43*  --  48*  --   --   --  55*  ANIONGAP 9  --  9  --   --   --  7   < > = values in this interval not displayed.     Hematology Recent Labs  Lab 05/19/20 0628 05/21/20 1212 05/22/20 0353  WBC 14.7* 17.0* 16.0*  RBC 4.03* 3.80* 3.55*  HGB 12.8* 12.3* 11.5*  HCT 35.5* 33.8* 31.7*  MCV 88.1 88.9 89.3  MCH 31.8 32.4 32.4  MCHC 36.1* 36.4* 36.3*  RDW 12.6 12.9 13.3  PLT 227 259 266   HbA1c  Lab Results  Component Value Date   HGBA1C 6.0 (H) 04/26/2020    Radiology    CT ABDOMEN PELVIS WO CONTRAST  Result Date: 05/18/2020 CLINICAL DATA:  Fever. Cough. Nausea x3 days. Constipation x1 week. EXAM: CT ABDOMEN AND PELVIS WITHOUT CONTRAST TECHNIQUE: Multidetector CT imaging of the abdomen and pelvis was performed following the standard protocol without IV contrast. COMPARISON:  April 19, 2020 FINDINGS: Lower chest: There are new trace bilateral pleural effusions, right greater than left with adjacent bibasilar atelectasis.The heart is enlarged. Hepatobiliary: The liver is normal. Normal gallbladder.There is no biliary ductal dilation. Pancreas: Again noted is a small cystic lesion in the pancreatic neck measuring approximately 1.2 cm. There is new mild hazy fat stranding about the pancreas. Spleen: Calcifications are noted in the  patient's spleen. Adrenals/Urinary Tract: --Adrenal glands: Unremarkable. --Right kidney/ureter: Again noted is a right renal cyst. --Left kidney/ureter: No hydronephrosis or radiopaque kidney stones. --Urinary bladder: The urinary bladder is significantly distended. There is a left bladder wall diverticulum. Stomach/Bowel: --Stomach/Duodenum: There is a moderate-sized hiatal hernia. --Small bowel: Unremarkable. --Colon: There is an above average amount of stool in the colon. --Appendix: Normal. Vascular/Lymphatic: Atherosclerotic calcification is present within the non-aneurysmal abdominal aorta, without hemodynamically significant stenosis. --No retroperitoneal lymphadenopathy. --No mesenteric lymphadenopathy. --No pelvic or inguinal lymphadenopathy. Reproductive: The patient is status post prior prostatectomy. Other: There is new free fluid in the patient's pericolic gutters and in the presacral region. The patient is status post prior ventral wall hernia repair with mesh. There is a small fat containing umbilical hernia. There are bilateral fat containing inguinal hernias. Musculoskeletal. No acute displaced fractures. IMPRESSION: 1. New mild hazy fat stranding about the pancreas, concerning for acute pancreatitis. Correlation with lipase is recommended. 2. New trace bilateral pleural effusions, right greater than left, with adjacent bibasilar atelectasis. 3. Cardiomegaly. 4. Moderate-sized hiatal hernia. 5. Distended urinary bladder. 6. New free fluid in the abdomen and pelvis of unknown clinical significance. Aortic Atherosclerosis (ICD10-I70.0). Electronically Signed   By: Constance Holster M.D.   On: 05/18/2020 22:02   DG Abd 1 View  Result Date: 05/20/2020 CLINICAL DATA:  Abdominal pain. EXAM: ABDOMEN - 1 VIEW COMPARISON:  CT 2 days ago 05/18/2020 FINDINGS: Portable supine view of the lower abdomen and pelvis. No evidence of free intra-abdominal air on this limited view. No bowel dilatation to suggest  obstruction. Small volume of stool in the proximal colon. Tacks in the anterior abdominal wall from prior hernia repair. No radiopaque calculi or abnormal soft tissue calcifications. Splenic granuloma on CT not included in the field of view. IMPRESSION: No radiographic explanation for abdominal pain on this single portable supine view. Electronically Signed   By: Keith Rake M.D.   On: 05/20/2020 19:47   DG Chest Port 1 View  Result Date: 05/19/2020 CLINICAL DATA:  Sepsis. EXAM: PORTABLE CHEST 1 VIEW COMPARISON:  Chest x-ray dated 02/07/2016 FINDINGS: The lung volumes are low. The heart size is enlarged. Bibasilar airspace opacities are noted and  are favored to represent atelectasis. There are trace bilateral pleural effusions. There is no acute osseous abnormality. IMPRESSION: 1. Cardiomegaly with trace bilateral pleural effusions. 2. Bibasilar airspace opacities are favored to represent atelectasis. Superimposed infiltrate is not excluded. Electronically Signed   By: Constance Holster M.D.   On: 05/19/2020 01:17    Telemetry    Afib, 80's - Personally Reviewed  Cardiac Studies   2D Echocardiogram 7.27.2021  1. Left ventricular ejection fraction, by estimation, is 60 to 65%. The  left ventricle has normal function. The left ventricle has no regional  wall motion abnormalities. Left ventricular diastolic parameters are  indeterminate.   2. Right ventricular systolic function is normal. The right ventricular  size is normal. There is normal pulmonary artery systolic pressure.   3. Left atrial size was mildly dilated.   4. Mild to moderate mitral valve regurgitation.   5. Rhythm is atrial fibrillation    Patient Profile     79 y.o. male with a hx of prediabetes, HTN, HLD, hypothyroidism, chronic dizziness, neuropathy, and OA, who was admitted 7/24 w/ weakness, fever, cough, nausea, constipation, hyponatremia, AKI (creat 2.6), bladder distension, ? UTI, dehydration, ? Pancreatitis by  CT (lipase nl however), and new onset rate-controlled afib.  Assessment & Plan    1.  Persistent Atrial Fibrillation:  Pt presented w/ dehydration, AKI, UTI, and hyponatremia (118), and was also noted to be in rate-controlled afib.  Occasional palps, but relatively asymptomatic otw.  Rates mostly 80's yesterday, sl higher this AM in 90's to low 100's.  BPs remain soft - 90's to 110.  He is not currently on AVN blocking agents.  Echo yesterday w/ nl EF.  Mild to mod MR.  LA mildly dil.  Xarelto 15mg  daily added yesterday in setting of CHA2DS2VASc = 4.  Cont conservative rx for afib w/ treatment of other comorbid conditions.  Can re-eval rhythm/rate control strategy as outpt.  2.  Hyponatremia:  Improving.  Na 129 this AM.  On hypertonic saline.  3.  ? UTI:  UA neg.  Abx d/c'd.  4.  AKI/Dehydration:  In setting of poor PO intake/FTT.  Creat improving.  5.  Constipation:  Recalcitrant to miralax, stool softeners, enema.  Passing gas.  + BS.  Per IM.  Signed, Murray Hodgkins, NP  05/22/2020, 8:21 AM    For questions or updates, please contact   Please consult [email or web address] for contact info under Cardiology/STEMI.

## 2020-05-22 NOTE — Progress Notes (Signed)
PROGRESS NOTE    Arthur Romero  MWN:[id number] DOB: [birth year] DOA: 05/18/2020 PCP: Olin Hauser, DO   Chief complaint.  Severe constipation.  Brief Narrative:  Arthur Romero is a 79 y.o. malewith medical history significant forhypertension, gout, GERD, hypothyroidism. Patient presented secondary to weakness, nausea and abdominal pain and found to have profound hyponatremia in addition to evidence AKI/urinary retention. Patient started on empiric antibiotics for possible infectious etiology. Initially managed with normal saline for hyponatremia and now on hypertonic saline.  7/28.  Patient was seen by nephrology, was placed on 3% saline infusion.  Sodium level now at 131.  Change to oral sodium at 1 g twice a day.  Also give GoLYTELY for severe constipation.    Assessment & Plan:   Principal Problem:   Hyponatremia Active Problems:   Acquired hypothyroidism   Essential hypertension   Abdominal pain   AKI (acute kidney injury) ([place])   Acute urinary retention   Generalized weakness   Malnutrition of moderate degree  #1.  Severe hyponatremia.  Secondary to SIADH and with some dehydration. Sodium level is better after giving 3% sodium chloride.  Continue oral sodium tablets at 1 g twice a day and sodium level starting coming down again.  Monitor sodium level every 8 hours now.  #2.  Urinary retention.  Suspect UTI, but urine culture come back negative.  Antibiotics discontinued.  On Flomax.   3.  Acute kidney injury. Secondary to dehydration.  Renal function gradually improving.  Recheck a BMP level tomorrow.  4.  Severe constipation. Patient has been given scheduled MiraLAX twice a day, MiraLAX.  Still has no bowel movement for the last few days.  No evidence of acute abdomen based on exam.  No abdominal pain.  I added GoLYTELY after discussion with Dr. Candiss Norse.  5.  Atrial fibrillation. Heart rate under control.  Continue anticoagulation.  Appreciate  cardiology consult.  Echocardiogram showed ejection fraction 60 to 65%.  6.  Dysphagia. Patient was able to swallow, no history of aspiration pneumonia.  Can be followed as outpatient.    DVT prophylaxis: Xarelto Code Status: Full Family Communication: None Disposition Plan:  . Patient came from: Home            . Anticipated d/c place: Home . Barriers to d/c OR conditions which need to be met to effect a safe d/c:   Consultants:   Cardiology and nephrology  Procedures: None Antimicrobials:None  Subjective: Patient doing well today.  Currently still complaining some constipation, no bowel movement after taking higher dose of lactulose and MiraLAX.  No nausea vomiting.  No abdominal pain. Denies any fever chills. Denies any short of breath or palpitation.  Objective: Vitals:   05/22/20 0900 05/22/20 1000 05/22/20 1200 05/22/20 1300  BP:  (!) 116/56 (!) 114/64   Pulse: 91 (!) 111 101 (!) 107  Resp: '19 17 19 '$ (!) 31  Temp:   98.6 F (37 C)   TempSrc:   Oral   SpO2: 96% 96% 94% 92%  Weight:      Height:        Intake/Output Summary (Last 24 hours) at 05/22/2020 1351 Last data filed at 05/22/2020 1200 Gross per 24 hour  Intake 5269.1 ml  Output 1210 ml  Net 4059.1 ml   Filed Weights   05/18/20 1750 05/19/20 1630  Weight: 85.3 kg 89.7 kg    Examination:  General exam: Appears calm and comfortable  Respiratory system: Clear to auscultation. Respiratory effort normal.  Cardiovascular system: Irregular. No JVD, murmurs, rubs, gallops or clicks. No pedal edema. Gastrointestinal system: Abdomen is nondistended, soft and nontender. No organomegaly or masses felt. Normal bowel sounds heard. Central nervous system: Alert and oriented. No focal neurological deficits. Extremities: Symmetric  Skin: No rashes, lesions or ulcers Psychiatry: Judgement and insight appear normal. Mood & affect appropriate.     Data Reviewed: I have personally reviewed following labs and  imaging studies  CBC: Recent Labs  Lab 05/18/20 1754 05/19/20 0048 05/19/20 0628 05/21/20 1212 05/22/20 0353  WBC 19.8* 16.9* 14.7* 17.0* 16.0*  HGB 13.2 12.9* 12.8* 12.3* 11.5*  HCT 35.7* 35.9* 35.5* 33.8* 31.7*  MCV 86.9 88.6 88.1 88.9 89.3  PLT 281 252 227 259 782   Basic Metabolic Panel: Recent Labs  Lab 05/20/20 1208 05/20/20 1208 05/20/20 1744 05/20/20 1744 05/20/20 2302 05/21/20 0121 05/21/20 0457 05/21/20 0824 05/21/20 1940 05/21/20 2302 05/22/20 0353 05/22/20 0756 05/22/20 1217  NA 120*   < > 120*   < > 119*   < > 124*   < > 125* 125* 129* 131* 129*  K 4.3  --  4.0  --  4.1  --  4.0  --   --   --  4.6  --   --   CL 91*  --  93*  --  91*  --  98  --   --   --  104  --   --   CO2 20*  --  18*  --  19*  --  17*  --   --   --  18*  --   --   GLUCOSE 192*  --  157*  --  182*  --  140*  --   --   --  138*  --   --   BUN 41*  --  43*  --  46*  --  44*  --   --   --  42*  --   --   CREATININE 1.71*  --  1.73*  --  1.73*  --  1.59*  --   --   --  1.43*  --   --   CALCIUM 7.6*  --  7.8*  --  7.9*  --  7.6*  --   --   --  8.0*  --   --   MG  --   --   --   --   --   --   --   --   --   --   --  2.5*  --    < > = values in this interval not displayed.   GFR: Estimated Creatinine Clearance: 52.5 mL/min (A) (by C-G formula based on SCr of 1.43 mg/dL (H)). Liver Function Tests: No results for input(s): AST, ALT, ALKPHOS, BILITOT, PROT, ALBUMIN in the last 168 hours. Recent Labs  Lab 05/18/20 1754  LIPASE 29   No results for input(s): AMMONIA in the last 168 hours. Coagulation Profile: No results for input(s): INR, PROTIME in the last 168 hours. Cardiac Enzymes: No results for input(s): CKTOTAL, CKMB, CKMBINDEX, TROPONINI in the last 168 hours. BNP (last 3 results) No results for input(s): PROBNP in the last 8760 hours. HbA1C: No results for input(s): HGBA1C in the last 72 hours. CBG: Recent Labs  Lab 05/19/20 1630  GLUCAP 177*   Lipid Profile: No results  for input(s): CHOL, HDL, LDLCALC, TRIG, CHOLHDL, LDLDIRECT in the last 72 hours. Thyroid Function Tests: Recent  Labs    05/19/20 1827  TSH 1.550   Anemia Panel: No results for input(s): VITAMINB12, FOLATE, FERRITIN, TIBC, IRON, RETICCTPCT in the last 72 hours. Sepsis Labs: Recent Labs  Lab 05/18/20 2103 05/19/20 0048 05/20/20 0825 05/21/20 0457 05/22/20 0353  PROCALCITON  --   --  0.17 0.24 0.16  LATICACIDVEN 2.1* 1.3  --   --   --     Recent Results (from the past 240 hour(s))  Urine Culture     Status: None   Collection Time: 05/18/20  6:47 PM   Specimen: Urine, Random  Result Value Ref Range Status   Specimen Description   Final    URINE, RANDOM Performed at [facility], [address] [place], [place] [address]    Special Requests   Final    NONE Performed at [facility], [address] [place], [place] [address]    Culture   Final    NO GROWTH Performed at [facility], [place] [address] [place], [place] [address]    Report Status 05/20/2020 FINAL  Final  Blood culture (routine x 2)     Status: None (Preliminary result)   Collection Time: 05/18/20  9:03 PM   Specimen: BLOOD  Result Value Ref Range Status   Specimen Description BLOOD RIGHT ANTECUBITAL  Final   Special Requests   Final    BOTTLES DRAWN AEROBIC AND ANAEROBIC Blood Culture adequate volume   Culture   Final    NO GROWTH 4 DAYS Performed at [facility], [address], [place], [place] [address]    Report Status PENDING  Incomplete  Blood culture (routine x 2)     Status: None (Preliminary result)   Collection Time: 05/18/20  9:03 PM   Specimen: BLOOD  Result Value Ref Range Status   Specimen Description BLOOD BLOOD RIGHT WRIST  Final   Special Requests   Final    BOTTLES DRAWN AEROBIC AND ANAEROBIC Blood Culture results may not be optimal due to an inadequate volume of blood received in culture bottles   Culture   Final    NO GROWTH 4 DAYS Performed at  [facility], [address] [place], [place] [address]    Report Status PENDING  Incomplete  SARS Coronavirus 2 by RT PCR (hospital order, performed in [place] hospital lab) Nasopharyngeal Nasopharyngeal Swab     Status: None   Collection Time: 05/18/20  9:28 PM   Specimen: Nasopharyngeal Swab  Result Value Ref Range Status   SARS Coronavirus 2 NEGATIVE NEGATIVE Final    Comment: (NOTE) SARS-CoV-2 target nucleic acids are NOT DETECTED.  The SARS-CoV-2 RNA is generally detectable in upper and lower respiratory specimens during the acute phase of infection. The lowest concentration of SARS-CoV-2 viral copies this assay can detect is 250 copies / mL. A negative result does not preclude SARS-CoV-2 infection and should not be used as the sole basis for treatment or other patient management decisions.  A negative result may occur with improper specimen collection / handling, submission of specimen other than nasopharyngeal swab, presence of viral mutation(s) within the areas targeted by this assay, and inadequate number of viral copies (<250 copies / mL). A negative result must be combined with clinical observations, patient history, and epidemiological information.  Fact Sheet for Patients:   StrictlyIdeas.no  Fact Sheet for Healthcare Providers: BankingDealers.co.za  This test is not yet approved or  cleared by the [place] FDA and has been authorized  for detection and/or diagnosis of SARS-CoV-2 by FDA under an Emergency Use Authorization (EUA).  This EUA will remain in effect (meaning this test can be used) for the duration of the COVID-19 declaration under Section 564(b)(1) of the Act, 21 U.S.C. section 360bbb-3(b)(1), unless the authorization is terminated or revoked sooner.  Performed at [facility], [address] [place], [place] [address]   MRSA PCR Screening     Status: None   Collection Time:  05/19/20  4:31 PM   Specimen: Nasopharyngeal  Result Value Ref Range Status   MRSA by PCR NEGATIVE NEGATIVE Final    Comment:        The GeneXpert MRSA Assay (FDA approved for NASAL specimens only), is one component of a comprehensive MRSA colonization surveillance program. It is not intended to diagnose MRSA infection nor to guide or monitor treatment for MRSA infections. Performed at [facility], [address] [place], [place] [address]          Radiology Studies: DG Abd 1 View  Result Date: 05/20/2020 CLINICAL DATA:  Abdominal pain. EXAM: ABDOMEN - 1 VIEW COMPARISON:  CT 2 days ago 05/18/2020 FINDINGS: Portable supine view of the lower abdomen and pelvis. No evidence of free intra-abdominal air on this limited view. No bowel dilatation to suggest obstruction. Small volume of stool in the proximal colon. Tacks in the anterior abdominal wall from prior hernia repair. No radiopaque calculi or abnormal soft tissue calcifications. Splenic granuloma on CT not included in the field of view. IMPRESSION: No radiographic explanation for abdominal pain on this single portable supine view. Electronically Signed   By: Narda Rutherford M.D.   On: 05/20/2020 19:47   ECHOCARDIOGRAM COMPLETE  Result Date: 05/22/2020    ECHOCARDIOGRAM REPORT   Patient Name:   Arthur Romero Diebold Date of Exam: 05/21/2020 Medical Rec #:  [id number]         Height:       75.0 in Accession #:    [id number]        Weight:       197.8 lb Date of Birth:  June 07, 1941        BSA:          2.184 m Patient Age:    78 years          BP:           104/56 mmHg Patient Gender: M                 HR:           95 bpm. Exam Location:  [facility] Procedure: 2D Echo, Color Doppler and Cardiac Doppler Indications:     I48.91 Atrial Fibrillation  History:         Patient has no prior history of Echocardiogram examinations.                  Risk Factors:Hypertension.  Sonographer:     Humphrey Rolls RDCS (AE) Referring Phys:  [id number] Iba Diagnosing Phys: Julien Nordmann MD  Sonographer Comments: Suboptimal apical window and no subcostal window. IMPRESSIONS  1. Left ventricular ejection fraction, by estimation, is 60 to 65%. The left ventricle has normal function. The left ventricle has no regional wall motion abnormalities. Left ventricular diastolic parameters are indeterminate.  2. Right ventricular systolic function is normal. The right ventricular size is normal. There is normal pulmonary artery systolic pressure.  3. Left atrial size was mildly dilated.  4. Mild  to moderate mitral valve regurgitation.  5. Rhythm is atrial fibrillation FINDINGS  Left Ventricle: Left ventricular ejection fraction, by estimation, is 60 to 65%. The left ventricle has normal function. The left ventricle has no regional wall motion abnormalities. The left ventricular internal cavity size was normal in size. There is  no left ventricular hypertrophy. Left ventricular diastolic parameters are indeterminate. Right Ventricle: The right ventricular size is normal. No increase in right ventricular wall thickness. Right ventricular systolic function is normal. There is normal pulmonary artery systolic pressure. The tricuspid regurgitant velocity is 2.44 m/s, and  with an assumed right atrial pressure of 10 mmHg, the estimated right ventricular systolic pressure is 96.2 mmHg. Left Atrium: Left atrial size was mildly dilated. Right Atrium: Right atrial size was normal in size. Pericardium: There is no evidence of pericardial effusion. Mitral Valve: The mitral valve was not well visualized. Normal mobility of the mitral valve leaflets. Mild to moderate mitral valve regurgitation. No evidence of mitral valve stenosis. MV peak gradient, 8.2 mmHg. The mean mitral valve gradient is 3.0 mmHg. Tricuspid Valve: The tricuspid valve is not well visualized. Tricuspid valve regurgitation is mild . No evidence of tricuspid stenosis. Aortic Valve: The aortic valve is normal in  structure. Aortic valve regurgitation is not visualized. Mild aortic valve sclerosis is present, with no evidence of aortic valve stenosis. Aortic valve mean gradient measures 7.0 mmHg. Aortic valve peak gradient measures 13.5 mmHg. Aortic valve area, by VTI measures 2.86 cm. Pulmonic Valve: The pulmonic valve was not well visualized. Pulmonic valve regurgitation is not visualized. No evidence of pulmonic stenosis. Aorta: The aortic root is normal in size and structure. Venous: The inferior vena cava is normal in size with greater than 50% respiratory variability, suggesting right atrial pressure of 3 mmHg. IAS/Shunts: No atrial level shunt detected by color flow Doppler.  LEFT VENTRICLE PLAX 2D LVIDd:         5.15 cm  Diastology LVIDs:         3.57 cm  LV e' lateral:   16.50 cm/s LV PW:         1.05 cm  LV E/e' lateral: 8.1 LV IVS:        0.74 cm  LV e' medial:    10.20 cm/s LVOT diam:     2.00 cm  LV E/e' medial:  13.2 LV SV:         74 LV SV Index:   34 LVOT Area:     3.14 cm  LEFT ATRIUM             Index LA diam:        4.50 cm 2.06 cm/m LA Vol (A2C):   71.2 ml 32.60 ml/m LA Vol (A4C):   56.0 ml 25.64 ml/m LA Biplane Vol: 64.8 ml 29.67 ml/m  AORTIC VALVE                    PULMONIC VALVE AV Area (Vmax):    2.25 cm     PV Vmax:       1.06 m/s AV Area (Vmean):   2.43 cm     PV Vmean:      70.100 cm/s AV Area (VTI):     2.86 cm     PV VTI:        0.158 m AV Vmax:           184.00 cm/s  PV Peak grad:  4.5 mmHg AV Vmean:  119.000 cm/s PV Mean grad:  2.0 mmHg AV VTI:            0.260 m AV Peak Grad:      13.5 mmHg AV Mean Grad:      7.0 mmHg LVOT Vmax:         132.00 cm/s LVOT Vmean:        92.000 cm/s LVOT VTI:          0.237 m LVOT/AV VTI ratio: 0.91  AORTA Ao Root diam: 3.50 cm MITRAL VALVE                TRICUSPID VALVE MV Area (PHT): 4.17 cm     TR Peak grad:   23.8 mmHg MV Peak grad:  8.2 mmHg     TR Vmax:        244.00 cm/s MV Mean grad:  3.0 mmHg MV Vmax:       1.43 m/s     SHUNTS MV Vmean:       76.3 cm/s    Systemic VTI:  0.24 m MV Decel Time: 182 msec     Systemic Diam: 2.00 cm MV E velocity: 134.33 cm/s Ida Rogue MD Electronically signed by Ida Rogue MD Signature Date/Time: 05/22/2020/6:51:00 AM    Final         Scheduled Meds: . atorvastatin  40 mg Oral Daily  . Chlorhexidine Gluconate Cloth  6 each Topical Daily  . docusate sodium  100 mg Oral BID  . feeding supplement (ENSURE ENLIVE)  237 mL Oral BID BM  . gabapentin  200 mg Oral QHS  . lactulose  30 g Oral TID  . levothyroxine  88 mcg Oral QAC breakfast  . multivitamin with minerals  1 tablet Oral Daily  . pantoprazole  40 mg Oral Daily  . polyethylene glycol-electrolytes  2,000 mL Oral Once  . Rivaroxaban  15 mg Oral Q supper  . sodium chloride  1 g Oral BID WC  . tamsulosin  0.4 mg Oral Daily   Continuous Infusions:   LOS: 3 days    Time spent: 35 minutes    Sharen Hones, MD [facility]   To contact the attending provider between 7A-7P or the covering provider during after hours 7P-7A, please log into the web site [email or web address] and access using universal [place] password for that web site. If you do not have the password, please call the hospital operator.  05/22/2020, 1:51 PM

## 2020-05-22 NOTE — Plan of Care (Signed)
  Problem: Education: Goal: Knowledge of General Education information will improve Description: Including pain rating scale, medication(s)/side effects and non-pharmacologic comfort measures Outcome: Progressing   Problem: Health Behavior/Discharge Planning: Goal: Ability to manage health-related needs will improve Outcome: Progressing   Problem: Clinical Measurements: Goal: Ability to maintain clinical measurements within normal limits will improve Outcome: Progressing Goal: Will remain free from infection Outcome: Progressing Goal: Diagnostic test results will improve Outcome: Progressing Goal: Respiratory complications will improve Outcome: Progressing Goal: Cardiovascular complication will be avoided Outcome: Progressing   Problem: Activity: Goal: Risk for activity intolerance will decrease Outcome: Progressing   Problem: Nutrition: Goal: Adequate nutrition will be maintained Outcome: Progressing   Problem: Coping: Goal: Level of anxiety will decrease Outcome: Progressing   Problem: Elimination: Goal: Will not experience complications related to bowel motility Outcome: Progressing Goal: Will not experience complications related to urinary retention Outcome: Progressing   Problem: Pain Managment: Goal: General experience of comfort will improve Outcome: Progressing   Problem: Safety: Goal: Ability to remain free from injury will improve Outcome: Progressing   Problem: Skin Integrity: Goal: Risk for impaired skin integrity will decrease Outcome: Progressing   Problem: Nutrition Goal: Patient maintains adequate hydration Outcome: Progressing Goal: Patient maintains weight Outcome: Progressing Goal: Patient/Family demonstrates understanding of diet Outcome: Progressing Goal: Patient/Family independently completes tube feeding Outcome: Progressing Goal: Patient will have no more than 5 lb weight change during LOS Outcome: Progressing Goal: Patient will  utilize adaptive techniques to administer nutrition Outcome: Progressing Goal: Patient will verbalize dietary restrictions Outcome: Progressing   

## 2020-05-23 DIAGNOSIS — R531 Weakness: Secondary | ICD-10-CM | POA: Diagnosis not present

## 2020-05-23 DIAGNOSIS — E871 Hypo-osmolality and hyponatremia: Secondary | ICD-10-CM | POA: Diagnosis not present

## 2020-05-23 DIAGNOSIS — E440 Moderate protein-calorie malnutrition: Secondary | ICD-10-CM | POA: Diagnosis not present

## 2020-05-23 DIAGNOSIS — I4819 Other persistent atrial fibrillation: Secondary | ICD-10-CM | POA: Diagnosis not present

## 2020-05-23 LAB — CULTURE, BLOOD (ROUTINE X 2)
Culture: NO GROWTH
Culture: NO GROWTH
Special Requests: ADEQUATE

## 2020-05-23 LAB — BASIC METABOLIC PANEL
Anion gap: 9 (ref 5–15)
BUN: 31 mg/dL — ABNORMAL HIGH (ref 8–23)
CO2: 20 mmol/L — ABNORMAL LOW (ref 22–32)
Calcium: 8.2 mg/dL — ABNORMAL LOW (ref 8.9–10.3)
Chloride: 104 mmol/L (ref 98–111)
Creatinine, Ser: 1.19 mg/dL (ref 0.61–1.24)
GFR calc Af Amer: >60 mL/min (ref >60)
GFR calc non Af Amer: 59 mL/min — ABNORMAL LOW (ref >60)
Glucose, Bld: 124 mg/dL — ABNORMAL HIGH (ref 70–99)
Potassium: 4.4 mmol/L (ref 3.5–5.1)
Sodium: 133 mmol/L — ABNORMAL LOW (ref 135–145)

## 2020-05-23 LAB — MAGNESIUM: Magnesium: 2.3 mg/dL (ref 1.7–2.4)

## 2020-05-23 LAB — SODIUM: Sodium: 133 mmol/L — ABNORMAL LOW (ref 135–145)

## 2020-05-23 LAB — SARS CORONAVIRUS 2 BY RT PCR (HOSPITAL ORDER, PERFORMED IN ~~LOC~~ HOSPITAL LAB): SARS Coronavirus 2: NEGATIVE

## 2020-05-23 MED ORDER — SIMETHICONE 80 MG PO CHEW
80.0000 mg | CHEWABLE_TABLET | Freq: Four times a day (QID) | ORAL | Status: DC | PRN
  Filled 2020-05-23: qty 1

## 2020-05-23 NOTE — TOC Progression Note (Signed)
Transition of Care [facility]) - Progression Note    Patient Details  Name: Arthur Romero MRN: [id number] Date of Birth: [birth year]  Transition of [facility]) CM/SW Contact  Shelbie Hutching, RN Phone Number: 05/23/2020, 2:30 PM  Clinical Narrative:    Patient has agreed to go to [facility] for skilled nursing rehab.  Patient has been approved by [facility] for SNF [place] ID is [id number] approved from today until 8/2.  Patient needs a repeat COVID before discharge from hospital.   Plan to DC to [facility].    Expected Discharge Plan: Skilled Nursing Facility Barriers to Discharge: Ship broker, Continued Medical Work up  Expected Discharge Plan and Services Expected Discharge Plan: [place] Choice: [place] arrangements for the past 2 months: Single Family Home                                       Social Determinants of Health (SDOH) Interventions    Readmission Risk Interventions No flowsheet data found.

## 2020-05-23 NOTE — Care Management Important Message (Signed)
Important Message  Patient Details  Name: URA HAUSEN MRN: [id number] Date of Birth: 1941-06-13   Medicare Important Message Given:  Yes     Juliann Pulse A Karem Farha 05/23/2020, 11:07 AM

## 2020-05-23 NOTE — Progress Notes (Signed)
Progress Note  Patient Name: Arthur Romero Date of Encounter: 05/23/2020  [facility] Cardiologist: Ida Rogue, MD  Subjective   Feels very weak today, just got cleaned up at his shower, now tired Otherwise no specific complaints No pain, no shortness of breath, laying flat No tachypalpitations  Inpatient Medications    Scheduled Meds:  atorvastatin  40 mg Oral Daily   Chlorhexidine Gluconate Cloth  6 each Topical Daily   docusate sodium  100 mg Oral BID   feeding supplement (ENSURE ENLIVE)  237 mL Oral BID BM   gabapentin  200 mg Oral QHS   lactulose  30 g Oral TID   levothyroxine  88 mcg Oral QAC breakfast   multivitamin with minerals  1 tablet Oral Daily   pantoprazole  40 mg Oral Daily   Rivaroxaban  15 mg Oral Q supper   sodium chloride  1 g Oral BID WC   tamsulosin  0.4 mg Oral Daily   Continuous Infusions:  PRN Meds: acetaminophen **OR** acetaminophen, HYDROcodone-acetaminophen, ondansetron **OR** ondansetron (ZOFRAN) IV   Vital Signs    Vitals:   05/23/20 0015 05/23/20 0359 05/23/20 0407 05/23/20 0800  BP:  (!) 105/56  (!) 101/56  Pulse: 96 48  (!) 106  Resp:  20  18  Temp:  (!) 97.4 F (36.3 C)  98.3 F (36.8 C)  TempSrc:  Oral  Oral  SpO2: (!) 86% (!) 89% 94% 94%  Weight:      Height:        Intake/Output Summary (Last 24 hours) at 05/23/2020 1253 Last data filed at 05/22/2020 1600 Gross per 24 hour  Intake 500 ml  Output --  Net 500 ml   Last 3 Weights 05/19/2020 05/18/2020 04/26/2020  Weight (lbs) 197 lb 12 oz 188 lb 0.8 oz 178 lb 9.6 oz  Weight (kg) 89.7 kg 85.3 kg 81.012 kg      Telemetry    Atrial fibrillation rate 90-100- Personally Reviewed  ECG     - Personally Reviewed  Physical Exam   GEN: No acute distress.  Thin Neck: No JVD Cardiac:  Irregularly irregular no murmurs, rubs, or gallops.  Respiratory: Clear to auscultation bilaterally. GI: Soft, nontender, non-distended  MS: No edema; No  deformity. Neuro:  Nonfocal  Psych: Normal affect   Labs    High Sensitivity Troponin:  No results for input(s): TROPONINIHS in the last 720 hours.    Chemistry Recent Labs  Lab 05/21/20 0457 05/21/20 0824 05/22/20 0353 05/22/20 0756 05/22/20 2034 05/23/20 0410 05/23/20 1209  NA 124*   < > 129*   < > 133* 133* 133*  K 4.0  --  4.6  --   --  4.4  --   CL 98  --  104  --   --  104  --   CO2 17*  --  18*  --   --  20*  --   GLUCOSE 140*  --  138*  --   --  124*  --   BUN 44*  --  42*  --   --  31*  --   CREATININE 1.59*  --  1.43*  --   --  1.19  --   CALCIUM 7.6*  --  8.0*  --   --  8.2*  --   GFRNONAA 42*  --  47*  --   --  59*  --   GFRAA 48*  --  55*  --   --  >  60  --   ANIONGAP 9  --  7  --   --  9  --    < > = values in this interval not displayed.     Hematology Recent Labs  Lab 05/19/20 0628 05/21/20 1212 05/22/20 0353  WBC 14.7* 17.0* 16.0*  RBC 4.03* 3.80* 3.55*  HGB 12.8* 12.3* 11.5*  HCT 35.5* 33.8* 31.7*  MCV 88.1 88.9 89.3  MCH 31.8 32.4 32.4  MCHC 36.1* 36.4* 36.3*  RDW 12.6 12.9 13.3  PLT 227 259 266    BNPNo results for input(s): BNP, PROBNP in the last 168 hours.   DDimer No results for input(s): DDIMER in the last 168 hours.   Radiology    No results found.  Cardiac Studies   Echocardiogram 1. Left ventricular ejection fraction, by estimation, is 60 to 65%. The  left ventricle has normal function. The left ventricle has no regional  wall motion abnormalities. Left ventricular diastolic parameters are  indeterminate.  2. Right ventricular systolic function is normal. The right ventricular  size is normal. There is normal pulmonary artery systolic pressure.  3. Left atrial size was mildly dilated.  4. Mild to moderate mitral valve regurgitation.  5. Rhythm is atrial fibrillation   Patient Profile     79 year old gentleman with past medical history of hypertension, hyperlipidemia, hypothyroidism, Prediabetes, presenting with  weakness for the past week, cardiology consulted for new atrial fibrillation starting yesterday while inpatient  Assessment & Plan    New onset atrial fibrillation Symptomatic, suspect persistent Normal ejection fraction of 60% Presentation in the setting of electrolyte abnormality, hyponatremia, stress from constipation On Xarelto 15 mg daily this admission As he is asymptomatic, no plan for cardioversion at this time, can discuss as outpatient  Hyponatremia Etiology unclear, 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, he reports significant urine production Minimal improvement on normal saline, now with improvement on hypertonic saline Has been transitioned to salt tab  Failure to thrive Tremendous weakness on today's visit, will need skilled nursing facility/rehab Appears weakness has been a chronic issue, Used to drive do his own groceries now relies on neighbors and family to do his groceries and ADLs --Encouragement provided today  Constipation Has had four bowel movements past 24 hours, has had aggressive treatment this admission   Total encounter time more than 25 minutes  Greater than 50% was spent in counseling and coordination of care with the patient   For questions or updates, please contact [facility] Please consult [email or web address] for contact info under        Signed, Ida Rogue, MD  05/23/2020, 12:53 PM

## 2020-05-23 NOTE — Progress Notes (Signed)
[address] [place], [place] [address] Phone [phone]. Fax [phone]  Date: 05/23/2020                  Patient Name:  Arthur Romero  MRN: [id number]  DOB: 1941/06/15  Age / Sex: 79 y.o., male         PCP: Olin Hauser, DO                 Service Requesting Consult: IM/ Sharen Hones, MD                 Reason for Consult: ARF            History of Present Illness: Patient is a 79 y.o. male with medical problems of HTN, Gout, GERD, hypothyroidism, rheumatoid arthritis, who was admitted to [facility] on 05/18/2020 for Dehydration [E86.0] Hyponatremia [E87.1] Weakness [R53.1] Abdominal pain [R10.9] Sepsis ([place]) [A41.9]  Nephrology consult for Low Sodium  Brought  to ER by neighbor for weakness, fever, cough, nausea for 3 days prior. Fall 1 day prior to admission.  Started on 3% hypertonic saline at the time of admission  Doing fair today Appetite is good Denies any acute complaints    Current medications: Current Facility-Administered Medications  Medication Dose Route Frequency Provider Last Rate Last Admin  . acetaminophen (TYLENOL) tablet 650 mg  650 mg Oral Q6H PRN Athena Masse, MD   650 mg at 05/19/20 0112   Or  . acetaminophen (TYLENOL) suppository 650 mg  650 mg Rectal Q6H PRN Athena Masse, MD      . atorvastatin (LIPITOR) tablet 40 mg  40 mg Oral Daily Athena Masse, MD   40 mg at 05/23/20 0849  . Chlorhexidine Gluconate Cloth 2 % PADS 6 each  6 each Topical Daily Mariel Aloe, MD   6 each at 05/23/20 1400  . docusate sodium (COLACE) capsule 100 mg  100 mg Oral BID Athena Masse, MD   100 mg at 05/23/20 0850  . feeding supplement (ENSURE ENLIVE) (ENSURE ENLIVE) liquid 237 mL  237 mL Oral BID BM Mariel Aloe, MD   237 mL at 05/23/20 1400  . gabapentin (NEURONTIN) capsule 200 mg  200 mg Oral QHS Mariel Aloe, MD   200 mg at 05/22/20 2059  . HYDROcodone-acetaminophen (NORCO/VICODIN) 5-325 MG per tablet 1-2 tablet  1-2  tablet Oral Q4H PRN Athena Masse, MD      . lactulose (CHRONULAC) 10 GM/15ML solution 30 g  30 g Oral TID Murlean Iba, MD   30 g at 05/23/20 1528  . levothyroxine (SYNTHROID) tablet 88 mcg  88 mcg Oral QAC breakfast Athena Masse, MD   88 mcg at 05/23/20 0526  . multivitamin with minerals tablet 1 tablet  1 tablet Oral Daily Mariel Aloe, MD   1 tablet at 05/23/20 0851  . ondansetron (ZOFRAN) tablet 4 mg  4 mg Oral Q6H PRN Athena Masse, MD       Or  . ondansetron [facility]) injection 4 mg  4 mg Intravenous Q6H PRN Athena Masse, MD   4 mg at 05/21/20 0901  . pantoprazole (PROTONIX) EC tablet 40 mg  40 mg Oral Daily Athena Masse, MD   40 mg at 05/23/20 0851  . Rivaroxaban (XARELTO) tablet 15 mg  15 mg Oral Q supper Christell Faith M, PA-C   15 mg at 05/22/20 1734  . simethicone (MYLICON) chewable tablet 80 mg  80  mg Oral Q6H PRN Sharen Hones, MD      . sodium chloride tablet 1 g  1 g Oral BID WC Sharen Hones, MD   1 g at 05/23/20 0848  . tamsulosin (FLOMAX) capsule 0.4 mg  0.4 mg Oral Daily Judd Gaudier V, MD   0.4 mg at 05/23/20 0851      Vital Signs: Blood pressure (!) 101/56, pulse (!) 106, temperature 98.3 F (36.8 C), temperature source Oral, resp. rate 18, height 6\' 3"  (1.905 m), weight 89.7 kg, SpO2 94 %.   Intake/Output Summary (Last 24 hours) at 05/23/2020 1557 Last data filed at 05/22/2020 1600 Gross per 24 hour  Intake 500 ml  Output --  Net 500 ml    Weight trends: Filed Weights   05/18/20 1750 05/19/20 1630  Weight: 85.3 kg 89.7 kg    Physical Exam: General:  No acute distress, laying in the bed  HEENT  anicteric, moist oral mucous membranes  Neck:  Supple, no JVD, no masses  Lungs:  Normal breathing effort, clear to auscultation  Heart::  No rub or gallop  Abdomen:  Soft, nontender  Extremities:  Trace edema  Neurologic:  Alert, able to answer questions  Skin:  Warm, dry       Lab results: Basic Metabolic Panel: Recent Labs  Lab 05/21/20 0457  05/21/20 0824 05/22/20 0353 05/22/20 0353 05/22/20 0756 05/22/20 1217 05/22/20 2034 05/23/20 0410 05/23/20 1209  NA 124*   < > 129*   < > 131*   < > 133* 133* 133*  K 4.0  --  4.6  --   --   --   --  4.4  --   CL 98  --  104  --   --   --   --  104  --   CO2 17*  --  18*  --   --   --   --  20*  --   GLUCOSE 140*  --  138*  --   --   --   --  124*  --   BUN 44*  --  42*  --   --   --   --  31*  --   CREATININE 1.59*  --  1.43*  --   --   --   --  1.19  --   CALCIUM 7.6*  --  8.0*  --   --   --   --  8.2*  --   MG  --   --   --   --  2.5*  --   --  2.3  --    < > = values in this interval not displayed.    Liver Function Tests: No results for input(s): AST, ALT, ALKPHOS, BILITOT, PROT, ALBUMIN in the last 168 hours. Recent Labs  Lab 05/18/20 1754  LIPASE 29   No results for input(s): AMMONIA in the last 168 hours.  CBC: Recent Labs  Lab 05/21/20 1212 05/22/20 0353  WBC 17.0* 16.0*  HGB 12.3* 11.5*  HCT 33.8* 31.7*  MCV 88.9 89.3  PLT 259 266    Cardiac Enzymes: No results for input(s): CKTOTAL, TROPONINI in the last 168 hours.  BNP: Invalid input(s): POCBNP  CBG: Recent Labs  Lab 05/19/20 1630  GLUCAP 177*    Microbiology: Recent Results (from the past 720 hour(s))  Urine Culture     Status: None   Collection Time: 05/18/20  6:47 PM   Specimen: Urine, Random  Result Value Ref Range Status   Specimen Description   Final    URINE, RANDOM Performed at [facility], [address], [place], [place] [address]    Special Requests   Final    NONE Performed at [facility], [address], [place], [place] [address]    Culture   Final    NO GROWTH Performed at [facility], [place] [address] [place], [place] [address]    Report Status 05/20/2020 FINAL  Final  Blood culture (routine x 2)     Status: None   Collection Time: 05/18/20  9:03 PM   Specimen: BLOOD  Result Value Ref Range Status   Specimen Description BLOOD RIGHT  ANTECUBITAL  Final   Special Requests   Final    BOTTLES DRAWN AEROBIC AND ANAEROBIC Blood Culture adequate volume   Culture   Final    NO GROWTH 5 DAYS Performed at [facility], [address] [place] [place], [place] [address]    Report Status 05/23/2020 FINAL  Final  Blood culture (routine x 2)     Status: None   Collection Time: 05/18/20  9:03 PM   Specimen: BLOOD  Result Value Ref Range Status   Specimen Description BLOOD BLOOD RIGHT WRIST  Final   Special Requests   Final    BOTTLES DRAWN AEROBIC AND ANAEROBIC Blood Culture results may not be optimal due to an inadequate volume of blood received in culture bottles   Culture   Final    NO GROWTH 5 DAYS Performed at [facility], [address] [place], [place] [address]    Report Status 05/23/2020 FINAL  Final  SARS Coronavirus 2 by RT PCR (hospital order, performed in [place] hospital lab) Nasopharyngeal Nasopharyngeal Swab     Status: None   Collection Time: 05/18/20  9:28 PM   Specimen: Nasopharyngeal Swab  Result Value Ref Range Status   SARS Coronavirus 2 NEGATIVE NEGATIVE Final    Comment: (NOTE) SARS-CoV-2 target nucleic acids are NOT DETECTED.  The SARS-CoV-2 RNA is generally detectable in upper and lower respiratory specimens during the acute phase of infection. The lowest concentration of SARS-CoV-2 viral copies this assay can detect is 250 copies / mL. A negative result does not preclude SARS-CoV-2 infection and should not be used as the sole basis for treatment or other patient management decisions.  A negative result may occur with improper specimen collection / handling, submission of specimen other than nasopharyngeal swab, presence of viral mutation(s) within the areas targeted by this assay, and inadequate number of viral copies (<250 copies / mL). A negative result must be combined with clinical observations, patient history, and epidemiological information.  Fact Sheet for Patients:    StrictlyIdeas.no  Fact Sheet for Healthcare Providers: BankingDealers.co.za  This test is not yet approved or  cleared by the [place] FDA and has been authorized for detection and/or diagnosis of SARS-CoV-2 by FDA under an Emergency Use Authorization (EUA).  This EUA will remain in effect (meaning this test can be used) for the duration of the COVID-19 declaration under Section 564(b)(1) of the Act, 21 U.S.C. section 360bbb-3(b)(1), unless the authorization is terminated or revoked sooner.  Performed at [facility], [address] [place], [place] [address]   MRSA PCR Screening     Status: None   Collection Time: 05/19/20  4:31 PM   Specimen: Nasopharyngeal  Result Value Ref Range Status   MRSA by PCR NEGATIVE NEGATIVE Final  Comment:        The GeneXpert MRSA Assay (FDA approved for NASAL specimens only), is one component of a comprehensive MRSA colonization surveillance program. It is not intended to diagnose MRSA infection nor to guide or monitor treatment for MRSA infections. Performed at [facility], [address] [place], [place] [address]      Coagulation Studies: No results for input(s): LABPROT, INR in the last 72 hours.  Urinalysis: No results for input(s): COLORURINE, LABSPEC, PHURINE, GLUCOSEU, HGBUR, BILIRUBINUR, KETONESUR, PROTEINUR, UROBILINOGEN, NITRITE, LEUKOCYTESUR in the last 72 hours.  Invalid input(s): APPERANCEUR      Imaging: No results found.   Assessment & Plan: Pt is a 79 y.o.  male with HTN, Gout, GERD, hypothyroidism, rheumatoid arthritis, was admitted on 05/18/2020 with Dehydration [E86.0] Hyponatremia [E87.1] Weakness [R53.1] Abdominal pain [R10.9] Sepsis ([place]) [A41.9]   #Acute kidney injury   Baseline creatinine of 0.84 from April 26, 2020 Admission creatinine of 2.60.  Serum creatinine trend is improving as noted below.  Serum creatinine improved  with oral intake Lab Results  Component Value Date   CREATININE 1.19 05/23/2020   CREATININE 1.43 (H) 05/22/2020   CREATININE 1.59 (H) 05/21/2020   # Hyponatremia Serum sodium level improved to 133 this morning Discontinue 3% saline and monitor  We will sign off.  Please reconsult as necessary          LOS: [place] 7/29/20213:57 PM    Note: This note was prepared with Dragon dictation. Any transcription errors are unintentional

## 2020-05-23 NOTE — Progress Notes (Signed)
PROGRESS NOTE    Arthur Romero  JJO:[id number] DOB: 12/05/1940 DOA: 05/18/2020 PCP: Olin Hauser, DO    Chief complaint.  Constipation.  Brief Narrative:  Arthur A Crawfordis a 79 y.o.malewith medical history significant forhypertension, gout, GERD, hypothyroidism. Patient presented secondary to weakness, nausea and abdominal pain and found to have profound hyponatremia in addition to evidence AKI/urinary retention. Patient started on empiric antibiotics for possible infectious etiology.Initially managed with normal saline for hyponatremia and now on hypertonic saline.  7/28.  Patient was seen by nephrology, was placed on 3% saline infusion.  Sodium level now at 131.  Change to oral sodium at 1 g twice a day.  Also give GoLYTELY for severe constipation.  7/29.  Sodium continue improved at 133 today, patient had 4 large stools after giving GoLYTELY.  Pending SNF placement.   Assessment & Plan:   Principal Problem:   Hyponatremia Active Problems:   Acquired hypothyroidism   Essential hypertension   Abdominal pain   AKI (acute kidney injury) ([place])   Acute urinary retention   Generalized weakness   Malnutrition of moderate degree   Persistent atrial fibrillation ([place])  #1.  Severe hyponatremia. Secondary to SIADH and some dehydration. Sodium level much improved.  Continue sodium supplements.  Recheck a BMP tomorrow.  Will decide tomorrow if we need additional sodium tablets.  2.  Urinary retention. On Flomax.  Condition improved.  3.  Severe constipation. Condition resolved after giving GoLYTELY.  Continue scheduled stool softeners.  4.  Acute kidney injury. Renal function improved.  5.  Permanent atrial fibrillation. Continue anticoagulation.  6.  Dysphagia. Outpatient follow-up with GI.   DVT prophylaxis: Xarelto Code Status: Full Family Communication: None Disposition Plan:   Patient came from: Home                                                                                                                            Anticipated d/c place: Home  Barriers to d/c OR conditions which need to be met to effect a safe d/c:   Consultants:   Cardiology and nephrology  Procedures: None Antimicrobials:None   Subjective: Patient had a multiple bowel movement yesterday, had increased gas today.  No nausea vomiting or abdominal pain.  No short of breath or palpitation.  No fever or chills.  Objective: Vitals:   05/23/20 0015 05/23/20 0359 05/23/20 0407 05/23/20 0800  BP:  (!) 105/56  (!) 101/56  Pulse: 96 48  (!) 106  Resp:  20  18  Temp:  (!) 97.4 F (36.3 C)  98.3 F (36.8 C)  TempSrc:  Oral  Oral  SpO2: (!) 86% (!) 89% 94% 94%  Weight:      Height:        Intake/Output Summary (Last 24 hours) at 05/23/2020 1449 Last data filed at 05/22/2020 1600 Gross per 24 hour  Intake 500 ml  Output --  Net 500 ml   [employer]   05/18/20  1750 05/19/20 1630  Weight: 85.3 kg 89.7 kg    Examination:  General exam: Appears calm and comfortable  Respiratory system: Clear to auscultation. Respiratory effort normal. Cardiovascular system: Irregular.  No JVD, murmurs, rubs, gallops or clicks. No pedal edema. Gastrointestinal system: Abdomen is mildly distended, soft and nontender. No organomegaly or masses felt. Normal bowel sounds heard. Central nervous system: Alert and oriented. No focal neurological deficits. Extremities: Symmetric  Skin: No rashes, lesions or ulcers Psychiatry: Judgement and insight appear normal. Mood & affect appropriate.     Data Reviewed: I have personally reviewed following labs and imaging studies  CBC: Recent Labs  Lab 05/18/20 1754 05/19/20 0048 05/19/20 0628 05/21/20 1212 05/22/20 0353  WBC 19.8* 16.9* 14.7* 17.0* 16.0*  HGB 13.2 12.9* 12.8* 12.3* 11.5*  HCT 35.7* 35.9* 35.5* 33.8* 31.7*  MCV 86.9 88.6 88.1 88.9 89.3  PLT 281 252 227 259 266   Basic Metabolic Panel: Recent  Labs  Lab 05/20/20 1744 05/20/20 1744 05/20/20 2302 05/21/20 0121 05/21/20 0457 05/21/20 0824 05/22/20 0353 05/22/20 0353 05/22/20 0756 05/22/20 1217 05/22/20 2034 05/23/20 0410 05/23/20 1209  NA 120*   < > 119*   < > 124*   < > 129*   < > 131* 129* 133* 133* 133*  K 4.0  --  4.1  --  4.0  --  4.6  --   --   --   --  4.4  --   CL 93*  --  91*  --  98  --  104  --   --   --   --  104  --   CO2 18*  --  19*  --  17*  --  18*  --   --   --   --  20*  --   GLUCOSE 157*  --  182*  --  140*  --  138*  --   --   --   --  124*  --   BUN 43*  --  46*  --  44*  --  42*  --   --   --   --  31*  --   CREATININE 1.73*  --  1.73*  --  1.59*  --  1.43*  --   --   --   --  1.19  --   CALCIUM 7.8*  --  7.9*  --  7.6*  --  8.0*  --   --   --   --  8.2*  --   MG  --   --   --   --   --   --   --   --  2.5*  --   --  2.3  --    < > = values in this interval not displayed.   GFR: Estimated Creatinine Clearance: 63.1 mL/min (by C-G formula based on SCr of 1.19 mg/dL). Liver Function Tests: No results for input(s): AST, ALT, ALKPHOS, BILITOT, PROT, ALBUMIN in the last 168 hours. Recent Labs  Lab 05/18/20 1754  LIPASE 29   No results for input(s): AMMONIA in the last 168 hours. Coagulation Profile: No results for input(s): INR, PROTIME in the last 168 hours. Cardiac Enzymes: No results for input(s): CKTOTAL, CKMB, CKMBINDEX, TROPONINI in the last 168 hours. BNP (last 3 results) No results for input(s): PROBNP in the last 8760 hours. HbA1C: No results for input(s): HGBA1C in the last 72 hours. CBG: Recent Labs  Lab 05/19/20 1630  GLUCAP 177*   Lipid Profile: No results for input(s): CHOL, HDL, LDLCALC, TRIG, CHOLHDL, LDLDIRECT in the last 72 hours. Thyroid Function Tests: No results for input(s): TSH, T4TOTAL, FREET4, T3FREE, THYROIDAB in the last 72 hours. Anemia Panel: No results for input(s): VITAMINB12, FOLATE, FERRITIN, TIBC, IRON, RETICCTPCT in the last 72 hours. Sepsis  Labs: Recent Labs  Lab 05/18/20 2103 05/19/20 0048 05/20/20 0825 05/21/20 0457 05/22/20 0353  PROCALCITON  --   --  0.17 0.24 0.16  LATICACIDVEN 2.1* 1.3  --   --   --     Recent Results (from the past 240 hour(s))  Urine Culture     Status: None   Collection Time: 05/18/20  6:47 PM   Specimen: Urine, Random  Result Value Ref Range Status   Specimen Description   Final    URINE, RANDOM Performed at [facility], [address], [place], [place] [address]    Special Requests   Final    NONE Performed at [facility], [address] [place], [place] [address]    Culture   Final    NO GROWTH Performed at [facility], [place] [address] [place], [place] [address]    Report Status 05/20/2020 FINAL  Final  Blood culture (routine x 2)     Status: None   Collection Time: 05/18/20  9:03 PM   Specimen: BLOOD  Result Value Ref Range Status   Specimen Description BLOOD RIGHT ANTECUBITAL  Final   Special Requests   Final    BOTTLES DRAWN AEROBIC AND ANAEROBIC Blood Culture adequate volume   Culture   Final    NO GROWTH 5 DAYS Performed at [facility], [address] [place], [place] [address]    Report Status 05/23/2020 FINAL  Final  Blood culture (routine x 2)     Status: None   Collection Time: 05/18/20  9:03 PM   Specimen: BLOOD  Result Value Ref Range Status   Specimen Description BLOOD BLOOD RIGHT WRIST  Final   Special Requests   Final    BOTTLES DRAWN AEROBIC AND ANAEROBIC Blood Culture results may not be optimal due to an inadequate volume of blood received in culture bottles   Culture   Final    NO GROWTH 5 DAYS Performed at [facility], [address] [place],  [address]    Report Status 05/23/2020 FINAL  Final  SARS Coronavirus 2 by RT PCR (hospital order, performed in [place] hospital lab) Nasopharyngeal Nasopharyngeal Swab     Status: None   Collection Time: 05/18/20  9:28 PM   Specimen: Nasopharyngeal  Swab  Result Value Ref Range Status   SARS Coronavirus 2 NEGATIVE NEGATIVE Final    Comment: (NOTE) SARS-CoV-2 target nucleic acids are NOT DETECTED.  The SARS-CoV-2 RNA is generally detectable in upper and lower respiratory specimens during the acute phase of infection. The lowest concentration of SARS-CoV-2 viral copies this assay can detect is 250 copies / mL. A negative result does not preclude SARS-CoV-2 infection and should not be used as the sole basis for treatment or other patient management decisions.  A negative result may occur with improper specimen collection / handling, submission of specimen other than nasopharyngeal swab, presence of viral mutation(s) within the areas targeted by this assay, and inadequate number of viral copies (<250 copies / mL). A negative result must be combined with clinical observations, patient history, and epidemiological information.  Fact Sheet for Patients:   StrictlyIdeas.no  Fact Sheet for Healthcare Providers: BankingDealers.co.za  This test is not yet approved or  cleared by the [place] FDA and has been authorized for detection and/or diagnosis of SARS-CoV-2 by FDA under an Emergency Use Authorization (EUA).  This EUA will remain in effect (meaning this test can be used) for the duration of the COVID-19 declaration under Section 564(b)(1) of the Act, 21 U.S.C. section 360bbb-3(b)(1), unless the authorization is terminated or revoked sooner.  Performed at [facility], [address] [place], [place] [address]   MRSA PCR Screening     Status: None   Collection Time: 05/19/20  4:31 PM   Specimen: Nasopharyngeal  Result Value Ref Range Status   MRSA by PCR NEGATIVE NEGATIVE Final    Comment:        The GeneXpert MRSA Assay (FDA approved for NASAL specimens only), is one component of a comprehensive MRSA colonization surveillance program. It is not intended to  diagnose MRSA infection nor to guide or monitor treatment for MRSA infections. Performed at [facility], [address], [place], [place] [address]          Radiology Studies: No results found.      Scheduled Meds: . atorvastatin  40 mg Oral Daily  . Chlorhexidine Gluconate Cloth  6 each Topical Daily  . docusate sodium  100 mg Oral BID  . feeding supplement (ENSURE ENLIVE)  237 mL Oral BID BM  . gabapentin  200 mg Oral QHS  . lactulose  30 g Oral TID  . levothyroxine  88 mcg Oral QAC breakfast  . multivitamin with minerals  1 tablet Oral Daily  . pantoprazole  40 mg Oral Daily  . Rivaroxaban  15 mg Oral Q supper  . sodium chloride  1 g Oral BID WC  . tamsulosin  0.4 mg Oral Daily   Continuous Infusions:   LOS: 4 days    Time spent: 28 minutes    Sharen Hones, MD [facility]   To contact the attending provider between 7A-7P or the covering provider during after hours 7P-7A, please log into the web site [email or web address] and access using universal [place] password for that web site. If you do not have the password, please call the hospital operator.  05/23/2020, 2:49 PM

## 2020-05-23 NOTE — Progress Notes (Signed)
Physical Therapy Treatment Patient Details Name: Arthur Romero MRN: [id number] DOB: 1941/03/08 Today's Date: 05/23/2020    History of Present Illness Tayari Yankee is a 79 y/o male who was admitted for hyponatremia. Chief complaints include general malaise, feeling weak and tired, hot and sweaty with nausea and poor oral intake x 3 days. Pt now with abdominal pain and constipation. PMH includes HTN, gout, GERD, hypothyroidism, and RA.    PT Comments    Pt received in bed on 1L O2 via nasal cannula. Pt agreeable to PT session however reports increased weakness and fatigue. Pt performed standing marching with BUE on RW and CGA for steadying and required several seated rest breaks between sets secondary to fatigue. Pt's vitals remained stable with SpO2 remaining in mid to high 90s with activity. Deferred ambulation secondary to report of dizziness in standing and pt reports of 9/10 fatigue with activity. Pt then performed therex seated EOB and in semi-supine position for promotion of muscle strengthening. Limiting factor this session is fatigue and decreased standing activity tolerance. At this point., STR recommendation remains appropriate to maximize functional mobility and independence prior to returning home. Will continue to follow pt this acute stay and will progress pt as tolerated.  Follow Up Recommendations  SNF     Equipment Recommendations  Rolling walker with 5" wheels    Recommendations for Other Services       Precautions / Restrictions Precautions Precautions: Fall Restrictions Weight Bearing Restrictions: No    Mobility  Bed Mobility Overal bed mobility: Needs Assistance Bed Mobility: Supine to Sit;Sit to Supine     Supine to sit: Supervision Sit to supine: Supervision   General bed mobility comments: pt required increased effort to complete task with increased time  Transfers Overall transfer level: Needs assistance Equipment used: Rolling walker (2  wheeled) Transfers: Sit to/from Stand Sit to Stand: Min assist;From elevated surface         General transfer comment: pt performed sit <> stand from elevated bed and required min-mod A for boost into standing, balance while transitioning hands from bed to RW, and for eccentric control secondary to fatigue after standing activities; verbal cues for hand placement  Ambulation/Gait             General Gait Details: deferred secondary to limited activity tolerance to standing activities today   Stairs             Wheelchair Mobility    Modified Rankin (Stroke Patients Only)       Balance Overall balance assessment: Needs assistance Sitting-balance support: Feet supported Sitting balance-Leahy Scale: Good Sitting balance - Comments: no LOB noted   Standing balance support: Bilateral upper extremity supported;During functional activity Standing balance-Leahy Scale: Fair Standing balance comment: CGA for standing balance secondary to postural sway and reports of weakness                            Cognition Arousal/Alertness: Awake/alert Behavior During Therapy: WFL for tasks assessed/performed Overall Cognitive Status: Within Functional Limits for tasks assessed                                        Exercises Other Exercises Other Exercises: standing marches performed x 10 reps, 3 sets with min A; seated LAW x 10 bilat; semi-supine SLR and hip ab/add x 10 bilat  General Comments        Pertinent Vitals/Pain Pain Assessment: No/denies pain    Home Living                      Prior Function            PT Goals (current goals can now be found in the care plan section) Acute Rehab PT Goals Patient Stated Goal: get stronger and go home PT Goal Formulation: With patient Time For Goal Achievement: 06/03/20 Potential to Achieve Goals: Good Progress towards PT goals: Progressing toward goals    Frequency    Min  2X/week      PT Plan Current plan remains appropriate    Co-evaluation              AM-PAC PT "6 Clicks" Mobility   Outcome Measure  Help needed turning from your back to your side while in a flat bed without using bedrails?: None Help needed moving from lying on your back to sitting on the side of a flat bed without using bedrails?: A Little Help needed moving to and from a bed to a chair (including a wheelchair)?: A Little Help needed standing up from a chair using your arms (e.g., wheelchair or bedside chair)?: A Lot Help needed to walk in hospital room?: A Lot Help needed climbing 3-5 steps with a railing? : A Lot 6 Click Score: 16    End of Session Equipment Utilized During Treatment: Gait belt;Oxygen (1L via nasal cannula) Activity Tolerance: Patient limited by fatigue Patient left: in bed;with call bell/phone within reach;with bed alarm set Nurse Communication: Mobility status PT Visit Diagnosis: Unsteadiness on feet (R26.81);History of falling (Z91.81);Muscle weakness (generalized) (M62.81);Pain     Time: 1139-1208 PT Time Calculation (min) (ACUTE ONLY): 29 min  Charges:                       Vale Haven, SPT   Vale Haven 05/23/2020, 1:27 PM

## 2020-05-24 DIAGNOSIS — E7849 Other hyperlipidemia: Secondary | ICD-10-CM | POA: Diagnosis not present

## 2020-05-24 DIAGNOSIS — M2550 Pain in unspecified joint: Secondary | ICD-10-CM | POA: Diagnosis not present

## 2020-05-24 DIAGNOSIS — I700 Atherosclerosis of aorta: Secondary | ICD-10-CM | POA: Diagnosis not present

## 2020-05-24 DIAGNOSIS — I4891 Unspecified atrial fibrillation: Secondary | ICD-10-CM | POA: Diagnosis not present

## 2020-05-24 DIAGNOSIS — R109 Unspecified abdominal pain: Secondary | ICD-10-CM | POA: Diagnosis not present

## 2020-05-24 DIAGNOSIS — R531 Weakness: Secondary | ICD-10-CM | POA: Diagnosis not present

## 2020-05-24 DIAGNOSIS — R339 Retention of urine, unspecified: Secondary | ICD-10-CM | POA: Diagnosis not present

## 2020-05-24 DIAGNOSIS — M6281 Muscle weakness (generalized): Secondary | ICD-10-CM | POA: Diagnosis not present

## 2020-05-24 DIAGNOSIS — I119 Hypertensive heart disease without heart failure: Secondary | ICD-10-CM | POA: Diagnosis not present

## 2020-05-24 DIAGNOSIS — I10 Essential (primary) hypertension: Secondary | ICD-10-CM | POA: Diagnosis not present

## 2020-05-24 DIAGNOSIS — R2681 Unsteadiness on feet: Secondary | ICD-10-CM | POA: Diagnosis not present

## 2020-05-24 DIAGNOSIS — N179 Acute kidney failure, unspecified: Secondary | ICD-10-CM | POA: Diagnosis not present

## 2020-05-24 DIAGNOSIS — K219 Gastro-esophageal reflux disease without esophagitis: Secondary | ICD-10-CM | POA: Diagnosis not present

## 2020-05-24 DIAGNOSIS — E039 Hypothyroidism, unspecified: Secondary | ICD-10-CM | POA: Diagnosis not present

## 2020-05-24 DIAGNOSIS — Z7401 Bed confinement status: Secondary | ICD-10-CM | POA: Diagnosis not present

## 2020-05-24 DIAGNOSIS — Z9181 History of falling: Secondary | ICD-10-CM | POA: Diagnosis not present

## 2020-05-24 DIAGNOSIS — R338 Other retention of urine: Secondary | ICD-10-CM | POA: Diagnosis not present

## 2020-05-24 DIAGNOSIS — N390 Urinary tract infection, site not specified: Secondary | ICD-10-CM | POA: Diagnosis not present

## 2020-05-24 DIAGNOSIS — E871 Hypo-osmolality and hyponatremia: Secondary | ICD-10-CM | POA: Diagnosis not present

## 2020-05-24 DIAGNOSIS — E440 Moderate protein-calorie malnutrition: Secondary | ICD-10-CM | POA: Diagnosis not present

## 2020-05-24 DIAGNOSIS — I4819 Other persistent atrial fibrillation: Secondary | ICD-10-CM | POA: Diagnosis not present

## 2020-05-24 DIAGNOSIS — E222 Syndrome of inappropriate secretion of antidiuretic hormone: Secondary | ICD-10-CM | POA: Diagnosis not present

## 2020-05-24 DIAGNOSIS — R5381 Other malaise: Secondary | ICD-10-CM | POA: Diagnosis not present

## 2020-05-24 LAB — BASIC METABOLIC PANEL
Anion gap: 6 (ref 5–15)
BUN: 20 mg/dL (ref 8–23)
CO2: 22 mmol/L (ref 22–32)
Calcium: 8.3 mg/dL — ABNORMAL LOW (ref 8.9–10.3)
Chloride: 106 mmol/L (ref 98–111)
Creatinine, Ser: 0.87 mg/dL (ref 0.61–1.24)
GFR calc Af Amer: >60 mL/min (ref >60)
GFR calc non Af Amer: >60 mL/min (ref >60)
Glucose, Bld: 107 mg/dL — ABNORMAL HIGH (ref 70–99)
Potassium: 4.2 mmol/L (ref 3.5–5.1)
Sodium: 134 mmol/L — ABNORMAL LOW (ref 135–145)

## 2020-05-24 MED ORDER — SENNOSIDES-DOCUSATE SODIUM 8.6-50 MG PO TABS
2.0000 | ORAL_TABLET | Freq: Two times a day (BID) | ORAL | 0 refills | Status: DC
Start: 2020-05-24 — End: 2020-07-06

## 2020-05-24 MED ORDER — TAMSULOSIN HCL 0.4 MG PO CAPS: 0.4000 mg | ORAL_CAPSULE | Freq: Every day | ORAL | 0 refills | Status: AC

## 2020-05-24 MED ORDER — SODIUM CHLORIDE 1 G PO TABS: 1.0000 g | ORAL_TABLET | Freq: Every day | ORAL | 0 refills | Status: DC

## 2020-05-24 MED ORDER — RIVAROXABAN 15 MG PO TABS: 15.0000 mg | ORAL_TABLET | Freq: Every day | ORAL | 0 refills | Status: DC

## 2020-05-24 MED ORDER — DILTIAZEM HCL ER COATED BEADS 180 MG PO CP24
180.0000 mg | ORAL_CAPSULE | Freq: Every day | ORAL | 0 refills | Status: DC
Start: 2020-05-24 — End: 2020-06-26

## 2020-05-24 MED ORDER — ALPRAZOLAM 0.25 MG PO TABS
0.2500 mg | ORAL_TABLET | Freq: Once | ORAL | Status: AC
  Administered 2020-05-24: 0.25 mg via ORAL
  Filled 2020-05-24: qty 1

## 2020-05-24 MED ORDER — LACTULOSE 10 GM/15ML PO SOLN: 30.0000 g | Freq: Every day | ORAL | 0 refills | Status: DC

## 2020-05-24 NOTE — Discharge Summary (Addendum)
Physician Discharge Summary  Patient ID: Arthur Romero MRN: [id number] DOB/AGE: 1940-11-28 79 y.o.  Admit date: 05/18/2020 Discharge date: 05/24/2020    Patient instructions: #1.  Follow-up with PCP in 1 week. 2.  Fluid restriction, no restriction for salt intake. 3.  Repeat BMP in 1 week. 4.  May discontinue salt tablet when sodium is normalized. 5.  May give half the dose of GoLYTELY if stool softener is not effective. 6.  Schedule follow-up with gastroenterology for dysphagia.    Admission Diagnoses:  Discharge Diagnoses:  Principal Problem:   Hyponatremia Active Problems:   Acquired hypothyroidism   Essential hypertension   Abdominal pain   AKI (acute kidney injury) ([place])   Acute urinary retention   Generalized weakness   Malnutrition of moderate degree   Persistent atrial fibrillation ([place])   Discharged Condition: good  Hospital Course:  Arthur A Crawfordis a 79 y.o.malewith medical history significant forhypertension, gout, GERD, hypothyroidism. Patient presented secondary to weakness, nausea and abdominal pain and found to have profound hyponatremia in addition to evidence AKI/urinary retention. Patient started on empiric antibiotics for possible infectious etiology.Initially managed with normal saline for hyponatremia and now on hypertonic saline. Patient initially considered for urinary tract infection and sepsis.  Urine culture came back does not support a UTI.  No additional source of infection was identified.  As a result, sepsis is ruled out.  7/28.Patient was seen by nephrology, was placed on 3% saline infusion. Sodium level now at 131.Change to oral sodium at 1 g twice a day. Also give GoLYTELY for severe constipation.  7/29.  Sodium continue improved at 133 today, patient had 4 large stools after giving GoLYTELY.  Pending SNF placement.  #1.  Severe hyponatremia. Secondary to SIADH and some dehydration. Sodium level much improved.  Sodium  level 134.  I will continue with reduced dose of sodium tablets at 1 g daily.  Recheck a BMP in 1 week, may discontinue sodium tablet if sodium level is normalized.  2.  Urinary retention. On Flomax.  Condition improved.  3.  Severe constipation. Condition resolved after giving GoLYTELY.  Continue scheduled stool softeners.  4.  Acute kidney injury. Renal function improved.  5.  Permanent atrial fibrillation. Continue anticoagulation.  I have changed beta-blocker to diltiazem from Norvasc.  6.  Dysphagia. Outpatient follow-up with GI.   Consults: cardiology and nephrology  Significant Diagnostic Studies:  CT ABDOMEN AND PELVIS WITHOUT CONTRAST  TECHNIQUE: Multidetector CT imaging of the abdomen and pelvis was performed following the standard protocol without IV contrast.  COMPARISON:  April 19, 2020  FINDINGS: Lower chest: There are new trace bilateral pleural effusions, right greater than left with adjacent bibasilar atelectasis.The heart is enlarged.  Hepatobiliary: The liver is normal. Normal gallbladder.There is no biliary ductal dilation.  Pancreas: Again noted is a small cystic lesion in the pancreatic neck measuring approximately 1.2 cm. There is new mild hazy fat stranding about the pancreas.  Spleen: Calcifications are noted in the patient's spleen.  Adrenals/Urinary Tract:  --Adrenal glands: Unremarkable.  --Right kidney/ureter: Again noted is a right renal cyst.  --Left kidney/ureter: No hydronephrosis or radiopaque kidney stones.  --Urinary bladder: The urinary bladder is significantly distended. There is a left bladder wall diverticulum.  Stomach/Bowel:  --Stomach/Duodenum: There is a moderate-sized hiatal hernia.  --Small bowel: Unremarkable.  --Colon: There is an above average amount of stool in the colon.  --Appendix: Normal.  Vascular/Lymphatic: Atherosclerotic calcification is present within the non-aneurysmal  abdominal aorta, without hemodynamically significant  stenosis.  --No retroperitoneal lymphadenopathy.  --No mesenteric lymphadenopathy.  --No pelvic or inguinal lymphadenopathy.  Reproductive: The patient is status post prior prostatectomy.  Other: There is new free fluid in the patient's pericolic gutters and in the presacral region. The patient is status post prior ventral wall hernia repair with mesh. There is a small fat containing umbilical hernia. There are bilateral fat containing inguinal hernias.  Musculoskeletal. No acute displaced fractures.  IMPRESSION: 1. New mild hazy fat stranding about the pancreas, concerning for acute pancreatitis. Correlation with lipase is recommended. 2. New trace bilateral pleural effusions, right greater than left, with adjacent bibasilar atelectasis. 3. Cardiomegaly. 4. Moderate-sized hiatal hernia. 5. Distended urinary bladder. 6. New free fluid in the abdomen and pelvis of unknown clinical significance.  Aortic Atherosclerosis (ICD10-I70.0).   Electronically Signed   By: Arthur Romero M.D.   On: 05/18/2020 22:02  Echo: 1. Left ventricular ejection fraction, by estimation, is 60 to 65%. The left ventricle has normal function. The left ventricle has no regional wall motion abnormalities. Left ventricular diastolic parameters are indeterminate. 2. Right ventricular systolic function is normal. The right ventricular size is normal. There is normal pulmonary artery systolic pressure. 3. Left atrial size was mildly dilated. 4. Mild to moderate mitral valve regurgitation. 5. Rhythm is atrial fibrillation  Treatments: 3% saline infusion, anticoagulation.  Discharge Exam: Blood pressure (!) 136/73, pulse (!) 106, temperature 97.9 F (36.6 C), temperature source Oral, resp. rate 20, height 6\' 3"  (1.905 m), weight 89.7 kg, SpO2 91 %. General appearance: alert and cooperative Resp: clear to auscultation  bilaterally Cardio: Irregular and no murmurs. GI: soft, non-tender; bowel sounds normal; no masses,  no organomegaly Extremities: extremities normal, atraumatic, no cyanosis or edema  Disposition: Discharge disposition: 03-Skilled Nursing Facility       Discharge Instructions    Diet general   Complete by: As directed    Low fat, no salt restriction, fluid restriction 1800 ml/day   Increase activity slowly   Complete by: As directed      Allergies as of 05/24/2020      Reactions   Ace Inhibitors Swelling   Lip swelling Lip swelling Lip swelling   Prednisone Hives      Medication List    STOP taking these medications   amLODipine 10 MG tablet Commonly known as: NORVASC   cephALEXin 500 MG capsule Commonly known as: KEFLEX     TAKE these medications   acetaminophen 500 MG tablet Commonly known as: TYLENOL Take by mouth.   allopurinol 300 MG tablet Commonly known as: ZYLOPRIM Take 300 mg by mouth daily.   aspirin 81 MG chewable tablet Chew 81 mg by mouth daily.   atorvastatin 40 MG tablet Commonly known as: LIPITOR Take 1 tablet (40 mg total) by mouth daily.   diltiazem 180 MG 24 hr capsule Commonly known as: Cardizem CD Take 1 capsule (180 mg total) by mouth daily.   gabapentin 100 MG capsule Commonly known as: NEURONTIN Start 1 capsule daily at bed time, increase by 1 cap every 2-3 days as tolerated up to take 3 at once in evening. What changed:   how much to take  how to take this  when to take this  additional instructions   lactulose 10 GM/15ML solution Commonly known as: CHRONULAC Take 45 mLs (30 g total) by mouth daily.   levothyroxine 88 MCG tablet Commonly known as: SYNTHROID Take 1 tablet (88 mcg total) by mouth daily.   omeprazole 20 MG  capsule Commonly known as: PRILOSEC Take 1 capsule (20 mg total) by mouth daily.   ondansetron 4 MG disintegrating tablet Commonly known as: Zofran ODT Take 1 tablet (4 mg total) by mouth  every 8 (eight) hours as needed for nausea or vomiting.   Rivaroxaban 15 MG Tabs tablet Commonly known as: XARELTO Take 1 tablet (15 mg total) by mouth daily with supper.   senna-docusate 8.6-50 MG tablet Commonly known as: Senokot-S Take 2 tablets by mouth 2 (two) times daily.   sodium chloride 1 g tablet Take 1 tablet (1 g total) by mouth daily.   tamsulosin 0.4 MG Caps capsule Commonly known as: FLOMAX Take 1 capsule (0.4 mg total) by mouth daily.       Contact information for follow-up providers    Olin Hauser, DO Follow up in 1 week(s).   Specialty: Family Medicine Contact information: [place] [address] [phone]        Minna Merritts, MD .   Specialty: Cardiology Contact information: [place] [address] [phone]            Contact information for after-discharge care    [facility] .   Service: Skilled Nursing Contact information: [address] [phone]                36 minutes  Signed: Sharen Hones 05/24/2020, 9:03 AM

## 2020-05-24 NOTE — TOC Transition Note (Signed)
Transition of Care [facility]) - CM/SW Discharge Note   Patient Details  Name: Arthur Romero MRN: [id number] Date of Birth: January 09, 1941  [facility] CM/SW Contact:  Shelbie Hutching, RN Phone Number: 05/24/2020, 10:37 AM   Clinical Narrative:    Patient has been medically cleared for discharge today to [facility].  Patient will be going to room 30A, bedside RN to call report to [phone].  [facility] will provide transport, this RNCM will arrange transport once report has been called.     Final next level of care: [place] Barriers to Discharge: Barriers Resolved   Patient Goals and CMS Choice Patient states their goals for this hospitalization and ongoing recovery are:: Patient would like to go for rehab, chooses [facility] CMS Medicare.gov Compare Post Acute Care list provided to:: Patient Choice offered to / list presented to : Patient  Discharge Placement   Existing PASRR number confirmed : 05/21/20          Patient [facility] bed at: [facility] Patient to be transferred to facility by: [facility] Name of family member notified: Patient will notify family Patient and family notified of of transfer: 05/24/20  Discharge Plan and Services     Post Acute Care Choice: [place] Determinants of Health (SDOH) Interventions     Readmission Risk Interventions No flowsheet data found.

## 2020-05-24 NOTE — TOC Progression Note (Signed)
Transition of Care [facility]) - Progression Note    Patient Details  Name: DONTAVION NOXON MRN: [id number] Date of Birth: 1941-01-10  Transition [facility] CM/SW Contact  Shelbie Hutching, RN Phone Number: 05/24/2020, 12:45 PM  Clinical Narrative:     [facility] transport has been arranged, patient is 3rd on the list for pickup.   Expected Discharge Plan: Skilled Nursing Facility Barriers to Discharge: Barriers Resolved  Expected Discharge Plan and Services Expected Discharge Plan: [place] Choice: [place] arrangements for the past 2 months: Single Family Home Expected Discharge Date: 05/24/20                                     Social Determinants of Health (SDOH) Interventions    Readmission Risk Interventions No flowsheet data found.

## 2020-05-27 DIAGNOSIS — E871 Hypo-osmolality and hyponatremia: Secondary | ICD-10-CM | POA: Diagnosis not present

## 2020-05-27 DIAGNOSIS — E222 Syndrome of inappropriate secretion of antidiuretic hormone: Secondary | ICD-10-CM | POA: Diagnosis not present

## 2020-05-27 DIAGNOSIS — R531 Weakness: Secondary | ICD-10-CM | POA: Diagnosis not present

## 2020-05-27 DIAGNOSIS — R109 Unspecified abdominal pain: Secondary | ICD-10-CM | POA: Diagnosis not present

## 2020-06-04 DIAGNOSIS — E440 Moderate protein-calorie malnutrition: Secondary | ICD-10-CM | POA: Diagnosis not present

## 2020-06-04 DIAGNOSIS — I119 Hypertensive heart disease without heart failure: Secondary | ICD-10-CM | POA: Diagnosis not present

## 2020-06-04 DIAGNOSIS — R2681 Unsteadiness on feet: Secondary | ICD-10-CM | POA: Diagnosis not present

## 2020-06-04 DIAGNOSIS — E222 Syndrome of inappropriate secretion of antidiuretic hormone: Secondary | ICD-10-CM | POA: Diagnosis not present

## 2020-06-06 DIAGNOSIS — E039 Hypothyroidism, unspecified: Secondary | ICD-10-CM | POA: Diagnosis not present

## 2020-06-06 DIAGNOSIS — E7849 Other hyperlipidemia: Secondary | ICD-10-CM | POA: Diagnosis not present

## 2020-06-06 DIAGNOSIS — E222 Syndrome of inappropriate secretion of antidiuretic hormone: Secondary | ICD-10-CM | POA: Diagnosis not present

## 2020-06-06 DIAGNOSIS — E871 Hypo-osmolality and hyponatremia: Secondary | ICD-10-CM | POA: Diagnosis not present

## 2020-06-10 ENCOUNTER — Telehealth: Payer: Self-pay | Admitting: Family Medicine

## 2020-06-10 DIAGNOSIS — E860 Dehydration: Secondary | ICD-10-CM | POA: Diagnosis not present

## 2020-06-10 DIAGNOSIS — E440 Moderate protein-calorie malnutrition: Secondary | ICD-10-CM | POA: Diagnosis not present

## 2020-06-10 DIAGNOSIS — E222 Syndrome of inappropriate secretion of antidiuretic hormone: Secondary | ICD-10-CM | POA: Diagnosis not present

## 2020-06-10 DIAGNOSIS — E039 Hypothyroidism, unspecified: Secondary | ICD-10-CM | POA: Diagnosis not present

## 2020-06-10 DIAGNOSIS — I10 Essential (primary) hypertension: Secondary | ICD-10-CM | POA: Diagnosis not present

## 2020-06-10 DIAGNOSIS — K219 Gastro-esophageal reflux disease without esophagitis: Secondary | ICD-10-CM | POA: Diagnosis not present

## 2020-06-10 DIAGNOSIS — M109 Gout, unspecified: Secondary | ICD-10-CM | POA: Diagnosis not present

## 2020-06-10 DIAGNOSIS — I4819 Other persistent atrial fibrillation: Secondary | ICD-10-CM | POA: Diagnosis not present

## 2020-06-10 DIAGNOSIS — I2510 Atherosclerotic heart disease of native coronary artery without angina pectoris: Secondary | ICD-10-CM | POA: Diagnosis not present

## 2020-06-10 NOTE — Telephone Encounter (Signed)
Copied from [place] [id number]. Topic: Quick Communication - Home Health Verbal Orders >> Jun 10, 2020  4:21 PM Jodie Echevaria wrote: Caller/Agency: Altha Harm / [facility] Number: [phone] / ok to LM  Requesting OT/PT/Skilled Nursing/Social Work/Speech Therapy: Skilled nursing  Frequency: 1 wk 5  Social worker evaluation

## 2020-06-11 NOTE — Telephone Encounter (Signed)
Can you follow up on this to leave verbal or whatever orders they need?  Thanks  [name], [facility] 06/11/2020, 9:44 AM

## 2020-06-11 NOTE — Telephone Encounter (Signed)
Verbal given and they will fax order as well for provider to sign.

## 2020-06-12 ENCOUNTER — Telehealth: Payer: Self-pay | Admitting: Family Medicine

## 2020-06-12 DIAGNOSIS — E222 Syndrome of inappropriate secretion of antidiuretic hormone: Secondary | ICD-10-CM | POA: Diagnosis not present

## 2020-06-12 DIAGNOSIS — I10 Essential (primary) hypertension: Secondary | ICD-10-CM | POA: Diagnosis not present

## 2020-06-12 DIAGNOSIS — I2510 Atherosclerotic heart disease of native coronary artery without angina pectoris: Secondary | ICD-10-CM | POA: Diagnosis not present

## 2020-06-12 DIAGNOSIS — E860 Dehydration: Secondary | ICD-10-CM | POA: Diagnosis not present

## 2020-06-12 DIAGNOSIS — E039 Hypothyroidism, unspecified: Secondary | ICD-10-CM | POA: Diagnosis not present

## 2020-06-12 DIAGNOSIS — M109 Gout, unspecified: Secondary | ICD-10-CM | POA: Diagnosis not present

## 2020-06-12 DIAGNOSIS — K219 Gastro-esophageal reflux disease without esophagitis: Secondary | ICD-10-CM | POA: Diagnosis not present

## 2020-06-12 DIAGNOSIS — I4819 Other persistent atrial fibrillation: Secondary | ICD-10-CM | POA: Diagnosis not present

## 2020-06-12 DIAGNOSIS — E440 Moderate protein-calorie malnutrition: Secondary | ICD-10-CM | POA: Diagnosis not present

## 2020-06-12 NOTE — Telephone Encounter (Signed)
Copied from [place] [id number]. Topic: Quick Communication - Home Health Verbal Orders >> Jun 12, 2020 10:53 AM Jodie Echevaria wrote: Caller/Agency: Gerald Stabs / [facility] Number: [phone] Requesting OT/PT/Skilled Nursing/Social Work/Speech Therapy: Physical Therapy  Frequency: 2 w 4, 1w 4

## 2020-06-12 NOTE — Telephone Encounter (Signed)
Advised him that verbal was given 2 days ago but frequency was different.

## 2020-06-18 DIAGNOSIS — E039 Hypothyroidism, unspecified: Secondary | ICD-10-CM | POA: Diagnosis not present

## 2020-06-18 DIAGNOSIS — I4819 Other persistent atrial fibrillation: Secondary | ICD-10-CM | POA: Diagnosis not present

## 2020-06-18 DIAGNOSIS — E860 Dehydration: Secondary | ICD-10-CM | POA: Diagnosis not present

## 2020-06-18 DIAGNOSIS — I2510 Atherosclerotic heart disease of native coronary artery without angina pectoris: Secondary | ICD-10-CM | POA: Diagnosis not present

## 2020-06-18 DIAGNOSIS — K219 Gastro-esophageal reflux disease without esophagitis: Secondary | ICD-10-CM | POA: Diagnosis not present

## 2020-06-18 DIAGNOSIS — E440 Moderate protein-calorie malnutrition: Secondary | ICD-10-CM | POA: Diagnosis not present

## 2020-06-18 DIAGNOSIS — I10 Essential (primary) hypertension: Secondary | ICD-10-CM | POA: Diagnosis not present

## 2020-06-18 DIAGNOSIS — E222 Syndrome of inappropriate secretion of antidiuretic hormone: Secondary | ICD-10-CM | POA: Diagnosis not present

## 2020-06-18 DIAGNOSIS — M109 Gout, unspecified: Secondary | ICD-10-CM | POA: Diagnosis not present

## 2020-06-19 DIAGNOSIS — E039 Hypothyroidism, unspecified: Secondary | ICD-10-CM | POA: Diagnosis not present

## 2020-06-19 DIAGNOSIS — M109 Gout, unspecified: Secondary | ICD-10-CM | POA: Diagnosis not present

## 2020-06-19 DIAGNOSIS — E440 Moderate protein-calorie malnutrition: Secondary | ICD-10-CM | POA: Diagnosis not present

## 2020-06-19 DIAGNOSIS — E860 Dehydration: Secondary | ICD-10-CM | POA: Diagnosis not present

## 2020-06-19 DIAGNOSIS — E222 Syndrome of inappropriate secretion of antidiuretic hormone: Secondary | ICD-10-CM | POA: Diagnosis not present

## 2020-06-19 DIAGNOSIS — I2510 Atherosclerotic heart disease of native coronary artery without angina pectoris: Secondary | ICD-10-CM | POA: Diagnosis not present

## 2020-06-19 DIAGNOSIS — I10 Essential (primary) hypertension: Secondary | ICD-10-CM | POA: Diagnosis not present

## 2020-06-19 DIAGNOSIS — K219 Gastro-esophageal reflux disease without esophagitis: Secondary | ICD-10-CM | POA: Diagnosis not present

## 2020-06-19 DIAGNOSIS — I4819 Other persistent atrial fibrillation: Secondary | ICD-10-CM | POA: Diagnosis not present

## 2020-06-20 DIAGNOSIS — I2510 Atherosclerotic heart disease of native coronary artery without angina pectoris: Secondary | ICD-10-CM | POA: Diagnosis not present

## 2020-06-20 DIAGNOSIS — M109 Gout, unspecified: Secondary | ICD-10-CM | POA: Diagnosis not present

## 2020-06-20 DIAGNOSIS — I4819 Other persistent atrial fibrillation: Secondary | ICD-10-CM | POA: Diagnosis not present

## 2020-06-20 DIAGNOSIS — K219 Gastro-esophageal reflux disease without esophagitis: Secondary | ICD-10-CM | POA: Diagnosis not present

## 2020-06-20 DIAGNOSIS — E860 Dehydration: Secondary | ICD-10-CM | POA: Diagnosis not present

## 2020-06-20 DIAGNOSIS — E222 Syndrome of inappropriate secretion of antidiuretic hormone: Secondary | ICD-10-CM | POA: Diagnosis not present

## 2020-06-20 DIAGNOSIS — E039 Hypothyroidism, unspecified: Secondary | ICD-10-CM | POA: Diagnosis not present

## 2020-06-20 DIAGNOSIS — E440 Moderate protein-calorie malnutrition: Secondary | ICD-10-CM | POA: Diagnosis not present

## 2020-06-20 DIAGNOSIS — I10 Essential (primary) hypertension: Secondary | ICD-10-CM | POA: Diagnosis not present

## 2020-06-21 DIAGNOSIS — Q667 Congenital pes cavus, unspecified foot: Secondary | ICD-10-CM | POA: Diagnosis not present

## 2020-06-21 DIAGNOSIS — G629 Polyneuropathy, unspecified: Secondary | ICD-10-CM | POA: Diagnosis not present

## 2020-06-24 ENCOUNTER — Ambulatory Visit: Payer: Medicare PPO | Admitting: Family Medicine

## 2020-06-24 MED FILL — Levothyroxine Sodium Tab 88 MCG: ORAL | Qty: 88 | Status: AC

## 2020-06-25 ENCOUNTER — Ambulatory Visit: Payer: Medicare PPO | Admitting: Family Medicine

## 2020-06-25 ENCOUNTER — Encounter: Payer: Self-pay | Admitting: Family Medicine

## 2020-06-25 VITALS — BP 113/64 | HR 92 | Temp 97.1°F | Resp 16 | Ht 75.0 in | Wt 171.6 lb

## 2020-06-25 DIAGNOSIS — E039 Hypothyroidism, unspecified: Secondary | ICD-10-CM | POA: Diagnosis not present

## 2020-06-25 DIAGNOSIS — I4819 Other persistent atrial fibrillation: Secondary | ICD-10-CM | POA: Diagnosis not present

## 2020-06-25 DIAGNOSIS — E871 Hypo-osmolality and hyponatremia: Secondary | ICD-10-CM

## 2020-06-25 DIAGNOSIS — I10 Essential (primary) hypertension: Secondary | ICD-10-CM | POA: Diagnosis not present

## 2020-06-25 DIAGNOSIS — I2510 Atherosclerotic heart disease of native coronary artery without angina pectoris: Secondary | ICD-10-CM | POA: Diagnosis not present

## 2020-06-25 DIAGNOSIS — K219 Gastro-esophageal reflux disease without esophagitis: Secondary | ICD-10-CM | POA: Diagnosis not present

## 2020-06-25 DIAGNOSIS — E440 Moderate protein-calorie malnutrition: Secondary | ICD-10-CM | POA: Diagnosis not present

## 2020-06-25 DIAGNOSIS — D509 Iron deficiency anemia, unspecified: Secondary | ICD-10-CM

## 2020-06-25 DIAGNOSIS — E538 Deficiency of other specified B group vitamins: Secondary | ICD-10-CM

## 2020-06-25 DIAGNOSIS — M109 Gout, unspecified: Secondary | ICD-10-CM | POA: Diagnosis not present

## 2020-06-25 DIAGNOSIS — E860 Dehydration: Secondary | ICD-10-CM | POA: Diagnosis not present

## 2020-06-25 DIAGNOSIS — E222 Syndrome of inappropriate secretion of antidiuretic hormone: Secondary | ICD-10-CM | POA: Diagnosis not present

## 2020-06-25 NOTE — Progress Notes (Signed)
Subjective:    Patient ID: Arthur Romero, male    DOB: [birth year], 79 y.o.   MRN: [id number]  Arthur Romero is a 79 y.o. male presenting on 06/25/2020 for Hospitalization Follow-up (Hyponatremia--left rehab in home from past 2 weeks)  Accompanied by sister, Jamian Andujo  [place]  Hospital/Location: [facility] Date of Admission: 05/18/20 Date of Discharge: 05/24/20 Transitions of care telephone call: Not completed  Patient admitted to [facility] and was discharged 2 weeks ago.  Reason for Admission: weakness, hyponatremia, AKI  - Hospital H&P and Discharge Summary have been reviewed - Patient presents today 31 days after recent hospitalization.  He has done well throughout his hospital course, and [facility]  He was covered for UTI / sepsis and given antibiotics, but ultimately thought not urine cause.  He was treated with IV fluid rehydration, and Nephrology placed him on hypertonic saline, then eventualyl improved, changed to oral sodium 1g twice a day and given bowel regimen.  He was diagnosed with Severe Hyponatremia, secondary to SIADH and some dehydration, advised that once sodium normalized he could be discontinued off sodium tabs. Since discharge he has been drinking excess water, no other drinks, and taking sodium tabs, they had lab done in rehab but no results available here today. Requesting lab. He is urinating well and frequently. No UTI symptoms  Still has constipation, was using Senna on discharge, failed miralax. Has had some BM recently including today with some relief. He still feels some bloating.  Denies any active abdominal pain, dark stool, blood in stool, chest pain, dyspnea, headache, nausea vomiting, diarrhea   I have reviewed the discharge medication list, and have reconciled the current and discharge medications today.   Current Outpatient Medications:  .  acetaminophen (TYLENOL) 500 MG tablet, Take by mouth., Disp: , Rfl:    .  allopurinol (ZYLOPRIM) 300 MG tablet, Take 300 mg by mouth daily., Disp: , Rfl:  .  amLODipine (NORVASC) 10 MG tablet, Take 10 mg by mouth daily., Disp: , Rfl:  .  aspirin 81 MG chewable tablet, Chew 81 mg by mouth daily., Disp: , Rfl:  .  atorvastatin (LIPITOR) 40 MG tablet, Take 1 tablet (40 mg total) by mouth daily., Disp: 90 tablet, Rfl: 1 .  diltiazem (CARDIZEM CD) 180 MG 24 hr capsule, Take 1 capsule (180 mg total) by mouth daily., Disp: 30 capsule, Rfl: 0 .  gabapentin (NEURONTIN) 100 MG capsule, Start 1 capsule daily at bed time, increase by 1 cap every 2-3 days as tolerated up to take 3 at once in evening. (Patient taking differently: Take 200 mg by mouth at bedtime. ), Disp: 270 capsule, Rfl: 1 .  levothyroxine (SYNTHROID) 88 MCG tablet, Take 1 tablet (88 mcg total) by mouth daily., Disp: 90 tablet, Rfl: 3 .  omeprazole (PRILOSEC) 20 MG capsule, Take 1 capsule (20 mg total) by mouth daily., Disp: 90 capsule, Rfl: 1 .  ondansetron (ZOFRAN ODT) 4 MG disintegrating tablet, Take 1 tablet (4 mg total) by mouth every 8 (eight) hours as needed for nausea or vomiting., Disp: 30 tablet, Rfl: 0 .  Rivaroxaban (XARELTO) 15 MG TABS tablet, Take 1 tablet (15 mg total) by mouth daily with supper., Disp: 42 tablet, Rfl: 0 .  senna-docusate (SENOKOT-S) 8.6-50 MG tablet, Take 2 tablets by mouth 2 (two) times daily., Disp: 60 tablet, Rfl: 0 .  sodium chloride 1 g tablet, Take 1 tablet (1 g total) by mouth daily., Disp: 20 tablet,  Rfl: 0 .  tamsulosin (FLOMAX) 0.4 MG CAPS capsule, Take 0.4 mg by mouth. Patient took at rehab but not now, Disp: , Rfl:  .  lactulose (CHRONULAC) 10 GM/15ML solution, Take 45 mLs (30 g total) by mouth daily. (Patient not taking: Reported on 06/25/2020), Disp: 236 mL, Rfl: 0  ------------------------------------------------------------------------- Social History   Tobacco Use  . Smoking status: Never Smoker  . Smokeless tobacco: Never Used  Vaping Use  . Vaping Use: Never  used  Substance Use Topics  . Alcohol use: Never    Alcohol/week: 1.0 standard drink    Types: 1 Glasses of wine per week  . Drug use: Never    Review of Systems Per HPI unless specifically indicated above     Objective:    BP 113/64   Pulse 92   Temp (!) 97.1 F (36.2 C) (Temporal)   Resp 16   Ht 6\' 3"  (1.905 m)   Wt 171 lb 9.6 oz (77.8 kg)   SpO2 98%   BMI 21.45 kg/m   Wt Readings from Last 3 Encounters:  06/25/20 171 lb 9.6 oz (77.8 kg)  05/19/20 197 lb 12 oz (89.7 kg)  04/26/20 178 lb 9.6 oz (81 kg)    Physical Exam Vitals and nursing note reviewed.  Constitutional:      General: He is not in acute distress.    Appearance: He is well-developed. He is not diaphoretic.     Comments: Well-appearing thin elderly male, comfortable, cooperative  HENT:     Head: Normocephalic and atraumatic.  Eyes:     General:        Right eye: No discharge.        Left eye: No discharge.     Conjunctiva/sclera: Conjunctivae normal.  Neck:     Thyroid: No thyromegaly.  Cardiovascular:     Rate and Rhythm: Normal rate and regular rhythm.     Heart sounds: Normal heart sounds. No murmur heard.   Pulmonary:     Effort: Pulmonary effort is normal. No respiratory distress.     Breath sounds: Normal breath sounds. No wheezing or rales.  Musculoskeletal:        General: Normal range of motion.     Cervical back: Normal range of motion and neck supple.     Comments: Able to stand with assistance using walker and ambulate  Lymphadenopathy:     Cervical: No cervical adenopathy.  Skin:    General: Skin is warm and dry.     Findings: No erythema or rash.  Neurological:     Mental Status: He is alert and oriented to person, place, and time.  Psychiatric:        Behavior: Behavior normal.     Comments: Well groomed, good eye contact, normal speech and thoughts        Results for orders placed or performed during the hospital encounter of 05/18/20  Blood culture (routine x 2)    Specimen: BLOOD  Result Value Ref Range   Specimen Description BLOOD RIGHT ANTECUBITAL    Special Requests      BOTTLES DRAWN AEROBIC AND ANAEROBIC Blood Culture adequate volume   Culture      NO GROWTH 5 DAYS Performed at [facility], [address] [place], [place] [address]    Report Status 05/23/2020 FINAL   Blood culture (routine x 2)   Specimen: BLOOD  Result Value Ref Range   Specimen Description BLOOD BLOOD RIGHT WRIST    Special  Requests      BOTTLES DRAWN AEROBIC AND ANAEROBIC Blood Culture results may not be optimal due to an inadequate volume of blood received in culture bottles   Culture      NO GROWTH 5 DAYS Performed at [facility], [address] [place], [place] [address]    Report Status 05/23/2020 FINAL   SARS Coronavirus 2 by RT PCR (hospital order, performed in [facility] hospital lab) Nasopharyngeal Nasopharyngeal Swab   Specimen: Nasopharyngeal Swab  Result Value Ref Range   SARS Coronavirus 2 NEGATIVE NEGATIVE  Urine Culture   Specimen: Urine, Random  Result Value Ref Range   Specimen Description      URINE, RANDOM Performed at [facility], [address], [place], [place] [address]    Special Requests      NONE Performed at [facility], [address], [place], [place] [address]    Culture      NO GROWTH Performed at [facility], [place] [address], [place], [place] [address]    Report Status 05/20/2020 FINAL   MRSA PCR Screening   Specimen: Nasopharyngeal  Result Value Ref Range   MRSA by PCR NEGATIVE NEGATIVE  SARS Coronavirus 2 by RT PCR (hospital order, performed in [facility] hospital lab) Nasopharyngeal Nasopharyngeal Swab   Specimen: Nasopharyngeal Swab  Result Value Ref Range   SARS Coronavirus 2 NEGATIVE NEGATIVE  Basic metabolic panel  Result Value Ref Range   Sodium 116 (LL) 135 - 145 mmol/L   Potassium 4.5 3.5 - 5.1 mmol/L   Chloride 83 (L) 98 - 111 mmol/L   CO2 19 (L) 22 - 32  mmol/L   Glucose, Bld 148 (H) 70 - 99 mg/dL   BUN 45 (H) 8 - 23 mg/dL   Creatinine, Ser 2.60 (H) 0.61 - 1.24 mg/dL   Calcium 8.7 (L) 8.9 - 10.3 mg/dL   GFR calc non Af Amer 23 (L) >60 mL/min   GFR calc Af Amer 27 (L) >60 mL/min   Anion gap 14 5 - 15  CBC  Result Value Ref Range   WBC 19.8 (H) 4.0 - 10.5 K/uL   RBC 4.11 (L) 4.22 - 5.81 MIL/uL   Hemoglobin 13.2 13.0 - 17.0 g/dL   HCT 35.7 (L) 39 - 52 %   MCV 86.9 80.0 - 100.0 fL   MCH 32.1 26.0 - 34.0 pg   MCHC 37.0 (H) 30.0 - 36.0 g/dL   RDW 12.8 11.5 - 15.5 %   Platelets 281 150 - 400 K/uL   nRBC 0.0 0.0 - 0.2 %  Urinalysis, Complete w Microscopic  Result Value Ref Range   Color, Urine YELLOW (A) YELLOW   APPearance HAZY (A) CLEAR   Specific Gravity, Urine 1.016 1.005 - 1.030   pH 5.0 5.0 - 8.0   Glucose, UA NEGATIVE NEGATIVE mg/dL   Hgb urine dipstick NEGATIVE NEGATIVE   Bilirubin Urine NEGATIVE NEGATIVE   Ketones, ur NEGATIVE NEGATIVE mg/dL   Protein, ur NEGATIVE NEGATIVE mg/dL   Nitrite NEGATIVE NEGATIVE   Leukocytes,Ua NEGATIVE NEGATIVE   RBC / HPF 0-5 0 - 5 RBC/hpf   WBC, UA 0-5 0 - 5 WBC/hpf   Bacteria, UA NONE SEEN NONE SEEN   Squamous Epithelial / LPF NONE SEEN 0 - 5  Lactic acid, plasma  Result Value Ref Range   Lactic Acid, Venous 2.1 (HH) 0.5 - 1.9 mmol/L  Lactic acid, plasma  Result Value Ref Range   Lactic Acid, Venous  1.3 0.5 - 1.9 mmol/L  Lipase, blood  Result Value Ref Range   Lipase 29 11 - 51 U/L  CBC  Result Value Ref Range   WBC 16.9 (H) 4.0 - 10.5 K/uL   RBC 4.05 (L) 4.22 - 5.81 MIL/uL   Hemoglobin 12.9 (L) 13.0 - 17.0 g/dL   HCT 35.9 (L) 39 - 52 %   MCV 88.6 80.0 - 100.0 fL   MCH 31.9 26.0 - 34.0 pg   MCHC 35.9 30.0 - 36.0 g/dL   RDW 12.7 11.5 - 15.5 %   Platelets 252 150 - 400 K/uL   nRBC 0.0 0.0 - 0.2 %  Creatinine, serum  Result Value Ref Range   Creatinine, Ser 2.18 (H) 0.61 - 1.24 mg/dL   GFR calc non Af Amer 28 (L) >60 mL/min   GFR calc Af Amer 33 (L) >60 mL/min  Basic  metabolic panel  Result Value Ref Range   Sodium 118 (LL) 135 - 145 mmol/L   Potassium 4.3 3.5 - 5.1 mmol/L   Chloride 88 (L) 98 - 111 mmol/L   CO2 19 (L) 22 - 32 mmol/L   Glucose, Bld 131 (H) 70 - 99 mg/dL   BUN 44 (H) 8 - 23 mg/dL   Creatinine, Ser 2.00 (H) 0.61 - 1.24 mg/dL   Calcium 8.1 (L) 8.9 - 10.3 mg/dL   GFR calc non Af Amer 31 (L) >60 mL/min   GFR calc Af Amer 36 (L) >60 mL/min   Anion gap 11 5 - 15  CBC  Result Value Ref Range   WBC 14.7 (H) 4.0 - 10.5 K/uL   RBC 4.03 (L) 4.22 - 5.81 MIL/uL   Hemoglobin 12.8 (L) 13.0 - 17.0 g/dL   HCT 35.5 (L) 39 - 52 %   MCV 88.1 80.0 - 100.0 fL   MCH 31.8 26.0 - 34.0 pg   MCHC 36.1 (H) 30.0 - 36.0 g/dL   RDW 12.6 11.5 - 15.5 %   Platelets 227 150 - 400 K/uL   nRBC 0.0 0.0 - 0.2 %  Sodium  Result Value Ref Range   Sodium 119 (LL) 135 - 145 mmol/L  Osmolality  Result Value Ref Range   Osmolality 261 (L) 275 - 295 mOsm/kg  Basic metabolic panel  Result Value Ref Range   Sodium 117 (LL) 135 - 145 mmol/L   Potassium 4.0 3.5 - 5.1 mmol/L   Chloride 88 (L) 98 - 111 mmol/L   CO2 20 (L) 22 - 32 mmol/L   Glucose, Bld 180 (H) 70 - 99 mg/dL   BUN 40 (H) 8 - 23 mg/dL   Creatinine, Ser 1.95 (H) 0.61 - 1.24 mg/dL   Calcium 7.7 (L) 8.9 - 10.3 mg/dL   GFR calc non Af Amer 32 (L) >60 mL/min   GFR calc Af Amer 38 (L) >60 mL/min   Anion gap 9 5 - 15  Basic metabolic panel  Result Value Ref Range   Sodium 117 (LL) 135 - 145 mmol/L   Potassium 4.2 3.5 - 5.1 mmol/L   Chloride 89 (L) 98 - 111 mmol/L   CO2 20 (L) 22 - 32 mmol/L   Glucose, Bld 202 (H) 70 - 99 mg/dL   BUN 43 (H) 8 - 23 mg/dL   Creatinine, Ser 1.85 (H) 0.61 - 1.24 mg/dL   Calcium 7.8 (L) 8.9 - 10.3 mg/dL   GFR calc non Af Amer 35 (L) >60 mL/min   GFR calc Af Amer 40 (L) >60  mL/min   Anion gap 8 5 - 15  Sodium, urine, random  Result Value Ref Range   Sodium, Ur <10 mmol/L  Osmolality, urine  Result Value Ref Range   Osmolality, Ur 472 300 - 900 mOsm/kg  Creatinine,  urine, random  Result Value Ref Range   Creatinine, Urine 170 mg/dL  TSH  Result Value Ref Range   TSH 1.550 0.350 - 4.500 uIU/mL  Glucose, capillary  Result Value Ref Range   Glucose-Capillary 177 (H) 70 - 99 mg/dL  Basic metabolic panel  Result Value Ref Range   Sodium 118 (LL) 135 - 145 mmol/L   Potassium 4.6 3.5 - 5.1 mmol/L   Chloride 89 (L) 98 - 111 mmol/L   CO2 18 (L) 22 - 32 mmol/L   Glucose, Bld 153 (H) 70 - 99 mg/dL   BUN 41 (H) 8 - 23 mg/dL   Creatinine, Ser 1.82 (H) 0.61 - 1.24 mg/dL   Calcium 7.8 (L) 8.9 - 10.3 mg/dL   GFR calc non Af Amer 35 (L) >60 mL/min   GFR calc Af Amer 41 (L) >60 mL/min   Anion gap 11 5 - 15  Basic metabolic panel  Result Value Ref Range   Sodium 122 (L) 135 - 145 mmol/L   Potassium 3.9 3.5 - 5.1 mmol/L   Chloride 96 (L) 98 - 111 mmol/L   CO2 18 (L) 22 - 32 mmol/L   Glucose, Bld 139 (H) 70 - 99 mg/dL   BUN 38 (H) 8 - 23 mg/dL   Creatinine, Ser 1.59 (H) 0.61 - 1.24 mg/dL   Calcium 7.4 (L) 8.9 - 10.3 mg/dL   GFR calc non Af Amer 42 (L) >60 mL/min   GFR calc Af Amer 48 (L) >60 mL/min   Anion gap 8 5 - 15  Basic metabolic panel  Result Value Ref Range   Sodium 122 (L) 135 - 145 mmol/L   Potassium 4.0 3.5 - 5.1 mmol/L   Chloride 94 (L) 98 - 111 mmol/L   CO2 22 22 - 32 mmol/L   Glucose, Bld 130 (H) 70 - 99 mg/dL   BUN 39 (H) 8 - 23 mg/dL   Creatinine, Ser 1.55 (H) 0.61 - 1.24 mg/dL   Calcium 7.3 (L) 8.9 - 10.3 mg/dL   GFR calc non Af Amer 43 (L) >60 mL/min   GFR calc Af Amer 50 (L) >60 mL/min   Anion gap 6 5 - 15  Procalcitonin - Baseline  Result Value Ref Range   Procalcitonin 0.17 ng/mL  Basic metabolic panel  Result Value Ref Range   Sodium 120 (L) 135 - 145 mmol/L   Potassium 4.0 3.5 - 5.1 mmol/L   Chloride 93 (L) 98 - 111 mmol/L   CO2 21 (L) 22 - 32 mmol/L   Glucose, Bld 150 (H) 70 - 99 mg/dL   BUN 39 (H) 8 - 23 mg/dL   Creatinine, Ser 1.67 (H) 0.61 - 1.24 mg/dL   Calcium 7.5 (L) 8.9 - 10.3 mg/dL   GFR calc non Af Amer  39 (L) >60 mL/min   GFR calc Af Amer 45 (L) >60 mL/min   Anion gap 6 5 - 15  Basic metabolic panel  Result Value Ref Range   Sodium 120 (L) 135 - 145 mmol/L   Potassium 4.3 3.5 - 5.1 mmol/L   Chloride 91 (L) 98 - 111 mmol/L   CO2 20 (L) 22 - 32 mmol/L   Glucose, Bld 192 (H) 70 - 99  mg/dL   BUN 41 (H) 8 - 23 mg/dL   Creatinine, Ser 1.71 (H) 0.61 - 1.24 mg/dL   Calcium 7.6 (L) 8.9 - 10.3 mg/dL   GFR calc non Af Amer 38 (L) >60 mL/min   GFR calc Af Amer 44 (L) >60 mL/min   Anion gap 9 5 - 15  Sodium, urine, random  Result Value Ref Range   Sodium, Ur <10 mmol/L  Osmolality, urine  Result Value Ref Range   Osmolality, Ur 529 300 - 900 mOsm/kg  Procalcitonin  Result Value Ref Range   Procalcitonin 0.24 ng/mL  Basic metabolic panel  Result Value Ref Range   Sodium 119 (LL) 135 - 145 mmol/L   Potassium 4.1 3.5 - 5.1 mmol/L   Chloride 91 (L) 98 - 111 mmol/L   CO2 19 (L) 22 - 32 mmol/L   Glucose, Bld 182 (H) 70 - 99 mg/dL   BUN 46 (H) 8 - 23 mg/dL   Creatinine, Ser 1.73 (H) 0.61 - 1.24 mg/dL   Calcium 7.9 (L) 8.9 - 10.3 mg/dL   GFR calc non Af Amer 38 (L) >60 mL/min   GFR calc Af Amer 43 (L) >60 mL/min   Anion gap 9 5 - 15  Basic metabolic panel  Result Value Ref Range   Sodium 124 (L) 135 - 145 mmol/L   Potassium 4.0 3.5 - 5.1 mmol/L   Chloride 98 98 - 111 mmol/L   CO2 17 (L) 22 - 32 mmol/L   Glucose, Bld 140 (H) 70 - 99 mg/dL   BUN 44 (H) 8 - 23 mg/dL   Creatinine, Ser 1.59 (H) 0.61 - 1.24 mg/dL   Calcium 7.6 (L) 8.9 - 10.3 mg/dL   GFR calc non Af Amer 42 (L) >60 mL/min   GFR calc Af Amer 48 (L) >60 mL/min   Anion gap 9 5 - 15  Basic metabolic panel  Result Value Ref Range   Sodium 120 (L) 135 - 145 mmol/L   Potassium 4.0 3.5 - 5.1 mmol/L   Chloride 93 (L) 98 - 111 mmol/L   CO2 18 (L) 22 - 32 mmol/L   Glucose, Bld 157 (H) 70 - 99 mg/dL   BUN 43 (H) 8 - 23 mg/dL   Creatinine, Ser 1.73 (H) 0.61 - 1.24 mg/dL   Calcium 7.8 (L) 8.9 - 10.3 mg/dL   GFR calc non Af Amer  38 (L) >60 mL/min   GFR calc Af Amer 43 (L) >60 mL/min   Anion gap 9 5 - 15  Sodium  Result Value Ref Range   Sodium 121 (L) 135 - 145 mmol/L  Sodium  Result Value Ref Range   Sodium 123 (L) 135 - 145 mmol/L  Osmolality  Result Value Ref Range   Osmolality 267 (L) 275 - 295 mOsm/kg  Osmolality, urine  Result Value Ref Range   Osmolality, Ur 531 300 - 900 mOsm/kg  Sodium, urine, random  Result Value Ref Range   Sodium, Ur <10 mmol/L  Sodium  Result Value Ref Range   Sodium 122 (L) 135 - 145 mmol/L  Sodium  Result Value Ref Range   Sodium 124 (L) 135 - 145 mmol/L  Sodium  Result Value Ref Range   Sodium 123 (L) 135 - 145 mmol/L  Sodium  Result Value Ref Range   Sodium 125 (L) 135 - 145 mmol/L  CBC  Result Value Ref Range   WBC 17.0 (H) 4.0 - 10.5 K/uL   RBC 3.80 (L)  4.22 - 5.81 MIL/uL   Hemoglobin 12.3 (L) 13.0 - 17.0 g/dL   HCT 33.8 (L) 39 - 52 %   MCV 88.9 80.0 - 100.0 fL   MCH 32.4 26.0 - 34.0 pg   MCHC 36.4 (H) 30.0 - 36.0 g/dL   RDW 12.9 11.5 - 15.5 %   Platelets 259 150 - 400 K/uL   nRBC 0.0 0.0 - 0.2 %  Sodium  Result Value Ref Range   Sodium 125 (L) 135 - 145 mmol/L  Procalcitonin  Result Value Ref Range   Procalcitonin 0.16 ng/mL  Basic metabolic panel  Result Value Ref Range   Sodium 129 (L) 135 - 145 mmol/L   Potassium 4.6 3.5 - 5.1 mmol/L   Chloride 104 98 - 111 mmol/L   CO2 18 (L) 22 - 32 mmol/L   Glucose, Bld 138 (H) 70 - 99 mg/dL   BUN 42 (H) 8 - 23 mg/dL   Creatinine, Ser 1.43 (H) 0.61 - 1.24 mg/dL   Calcium 8.0 (L) 8.9 - 10.3 mg/dL   GFR calc non Af Amer 47 (L) >60 mL/min   GFR calc Af Amer 55 (L) >60 mL/min   Anion gap 7 5 - 15  CBC  Result Value Ref Range   WBC 16.0 (H) 4.0 - 10.5 K/uL   RBC 3.55 (L) 4.22 - 5.81 MIL/uL   Hemoglobin 11.5 (L) 13.0 - 17.0 g/dL   HCT 31.7 (L) 39 - 52 %   MCV 89.3 80.0 - 100.0 fL   MCH 32.4 26.0 - 34.0 pg   MCHC 36.3 (H) 30.0 - 36.0 g/dL   RDW 13.3 11.5 - 15.5 %   Platelets 266 150 - 400 K/uL   nRBC  0.1 0.0 - 0.2 %  Sodium  Result Value Ref Range   Sodium 131 (L) 135 - 145 mmol/L  Sodium  Result Value Ref Range   Sodium 129 (L) 135 - 145 mmol/L  Magnesium  Result Value Ref Range   Magnesium 2.5 (H) 1.7 - 2.4 mg/dL  Sodium  Result Value Ref Range   Sodium 133 (L) 135 - 145 mmol/L  Basic metabolic panel  Result Value Ref Range   Sodium 133 (L) 135 - 145 mmol/L   Potassium 4.4 3.5 - 5.1 mmol/L   Chloride 104 98 - 111 mmol/L   CO2 20 (L) 22 - 32 mmol/L   Glucose, Bld 124 (H) 70 - 99 mg/dL   BUN 31 (H) 8 - 23 mg/dL   Creatinine, Ser 1.19 0.61 - 1.24 mg/dL   Calcium 8.2 (L) 8.9 - 10.3 mg/dL   GFR calc non Af Amer 59 (L) >60 mL/min   GFR calc Af Amer >60 >60 mL/min   Anion gap 9 5 - 15  Magnesium  Result Value Ref Range   Magnesium 2.3 1.7 - 2.4 mg/dL  Sodium  Result Value Ref Range   Sodium 133 (L) 135 - 145 mmol/L  Basic metabolic panel  Result Value Ref Range   Sodium 134 (L) 135 - 145 mmol/L   Potassium 4.2 3.5 - 5.1 mmol/L   Chloride 106 98 - 111 mmol/L   CO2 22 22 - 32 mmol/L   Glucose, Bld 107 (H) 70 - 99 mg/dL   BUN 20 8 - 23 mg/dL   Creatinine, Ser 0.87 0.61 - 1.24 mg/dL   Calcium 8.3 (L) 8.9 - 10.3 mg/dL   GFR calc non Af Amer >60 >60 mL/min   GFR calc Af Amer >60 >  60 mL/min   Anion gap 6 5 - 15  ECHOCARDIOGRAM COMPLETE  Result Value Ref Range   Weight 3,164.04 oz   Height 75 in   BP 104/56 mmHg   Ao pk vel 1.84 m/s   AV Area VTI 2.86 cm2   AR max vel 2.25 cm2   AV Mean grad 7.0 mmHg   AV Peak grad 13.5 mmHg   S' Lateral 3.57 cm   AV Area mean vel 2.43 cm2   Area-P 1/2 4.17 cm2      Assessment & Plan:   Problem List Items Addressed This Visit    Malnutrition of moderate degree   Hyponatremia   Relevant Orders   COMPLETE METABOLIC PANEL WITH GFR   Essential hypertension - Primary   Relevant Medications   amLODipine (NORVASC) 10 MG tablet   Other Relevant Orders   COMPLETE METABOLIC PANEL WITH GFR   CBC with Differential/Platelet      Other Visit Diagnoses    Vitamin B12 nutritional deficiency       Relevant Orders   Vitamin B12   Iron deficiency anemia, unspecified iron deficiency anemia type       Relevant Orders   CBC with Differential/Platelet      Notable clinical improvement after recent hospitalization for AKI, Hyponatremia, Generalized Weakness, among other constellation of issues. Determined no identified source of acute infection at that time. - He has completed discharge to SNF Rehab - Now back at home  Regarding, Hyponatremia - will check chemistry today, follow up on this, no interval update on his last sodium at rehab, he was still taking sodium tablets twice a day, and drinking excessive water. - Advised him today to LIMIT excess free water, can mix up with some low calorie gatorade, cranberry and other low sugar juices if available - May keep on sodium tablets until results come back - we can notify them if time to discontinue or not, was advised by Nephrology to stop once sodium normalized after discharge - Kidney function had returned to normal / baseline on 7/30 prior to DC from hospital  Regarding anemia, Hgb and Vitamin B12, due for repeat lab panel for anemia testing with his weakness - Hgb down to 11.5 on 7/28, will trend and follow up anemia panel - Caution ferrous sulfate to cause constipation  Constipation - can use senna or may retry the miralax, hospital gave him GoLYTELY  Improved PO intake overall reassurance, weight down 6-7 lbs from past 2 months, prior to hospital.    No orders of the defined types were placed in this encounter.   Follow up plan: Return in about 4 weeks (around 07/23/2020), or if symptoms worsen or fail to improve, for 4 week if not improved.  [name], [facility] 06/25/2020, 11:58 AM

## 2020-06-25 NOTE — Patient Instructions (Addendum)
Thank you for coming to the office today.  For now keep sodium tablets  Mix and match, less pure water, can do pedialyte / low calorie gatorde / cranberry  STILL drink some water  Labs today  Stay tuned on lab results, we will let you know in 1-2 days about the sodium tablets.  Keep on PT   For Constipation (less frequent bowel movement that can be hard dry or involve straining).  Use Senna for now  Other more natural remedies or preventative treatment: - Increase hydration with water - Increase fiber in diet (high fiber foods = vegetables, leafy greens, oats/grains) - May take OTC Fiber supplement (metamucil powder or pill/gummy) - May try OTC Probiotic    Please schedule a Follow-up Appointment to: Return if symptoms worsen or fail to improve.  If you have any other questions or concerns, please feel free to call the office or send a message through [facility]. You may also schedule an earlier appointment if necessary.  Additionally, you may be receiving a survey about your experience at our office within a few days to 1 week by e-mail or mail. We value your feedback.  [name], DO [place]

## 2020-06-26 ENCOUNTER — Other Ambulatory Visit: Payer: Self-pay | Admitting: Family Medicine

## 2020-06-26 DIAGNOSIS — I4819 Other persistent atrial fibrillation: Secondary | ICD-10-CM

## 2020-06-26 DIAGNOSIS — I10 Essential (primary) hypertension: Secondary | ICD-10-CM

## 2020-06-26 LAB — COMPLETE METABOLIC PANEL WITH GFR
AG Ratio: 1.6 (calc) (ref 1.0–2.5)
ALT: 19 U/L (ref 9–46)
AST: 18 U/L (ref 10–35)
Albumin: 4.4 g/dL (ref 3.6–5.1)
Alkaline phosphatase (APISO): 129 U/L (ref 35–144)
BUN: 10 mg/dL (ref 7–25)
CO2: 26 mmol/L (ref 20–32)
Calcium: 9.5 mg/dL (ref 8.6–10.3)
Chloride: 103 mmol/L (ref 98–110)
Creat: 0.95 mg/dL (ref 0.70–1.18)
GFR, Est African American: 90 mL/min/{1.73_m2} (ref >=60)
GFR, Est Non African American: 77 mL/min/{1.73_m2} (ref >=60)
Globulin: 2.7 g/dL (calc) (ref 1.9–3.7)
Glucose, Bld: 108 mg/dL — ABNORMAL HIGH (ref 65–99)
Potassium: 4 mmol/L (ref 3.5–5.3)
Sodium: 137 mmol/L (ref 135–146)
Total Bilirubin: 0.4 mg/dL (ref 0.2–1.2)
Total Protein: 7.1 g/dL (ref 6.1–8.1)

## 2020-06-26 LAB — CBC WITH DIFFERENTIAL/PLATELET
Absolute Monocytes: 1067 cells/uL — ABNORMAL HIGH (ref 200–950)
Basophils Absolute: 76 cells/uL (ref 0–200)
Basophils Relative: 0.6 %
Eosinophils Absolute: 165 cells/uL (ref 15–500)
Eosinophils Relative: 1.3 %
HCT: 42.4 % (ref 38.5–50.0)
Hemoglobin: 13.9 g/dL (ref 13.2–17.1)
Lymphs Abs: 1854 cells/uL (ref 850–3900)
MCH: 31 pg (ref 27.0–33.0)
MCHC: 32.8 g/dL (ref 32.0–36.0)
MCV: 94.4 fL (ref 80.0–100.0)
MPV: 9 fL (ref 7.5–12.5)
Monocytes Relative: 8.4 %
Neutro Abs: 9538 cells/uL — ABNORMAL HIGH (ref 1500–7800)
Neutrophils Relative %: 75.1 %
Platelets: 270 10*3/uL (ref 140–400)
RBC: 4.49 10*6/uL (ref 4.20–5.80)
RDW: 13.9 % (ref 11.0–15.0)
Total Lymphocyte: 14.6 %
WBC: 12.7 10*3/uL — ABNORMAL HIGH (ref 3.8–10.8)

## 2020-06-26 LAB — VITAMIN B12: Vitamin B-12: 358 pg/mL (ref 200–1100)

## 2020-06-26 MED ORDER — DILTIAZEM HCL ER COATED BEADS 180 MG PO CP24: 180.0000 mg | ORAL_CAPSULE | Freq: Every day | ORAL | 2 refills | Status: DC

## 2020-06-26 MED ORDER — RIVAROXABAN 20 MG PO TABS: 20.0000 mg | ORAL_TABLET | Freq: Every day | ORAL | 2 refills | Status: DC

## 2020-06-27 DIAGNOSIS — E222 Syndrome of inappropriate secretion of antidiuretic hormone: Secondary | ICD-10-CM | POA: Diagnosis not present

## 2020-06-27 DIAGNOSIS — E860 Dehydration: Secondary | ICD-10-CM | POA: Diagnosis not present

## 2020-06-27 DIAGNOSIS — K219 Gastro-esophageal reflux disease without esophagitis: Secondary | ICD-10-CM | POA: Diagnosis not present

## 2020-06-27 DIAGNOSIS — E440 Moderate protein-calorie malnutrition: Secondary | ICD-10-CM | POA: Diagnosis not present

## 2020-06-27 DIAGNOSIS — I10 Essential (primary) hypertension: Secondary | ICD-10-CM | POA: Diagnosis not present

## 2020-06-27 DIAGNOSIS — E039 Hypothyroidism, unspecified: Secondary | ICD-10-CM | POA: Diagnosis not present

## 2020-06-27 DIAGNOSIS — I4819 Other persistent atrial fibrillation: Secondary | ICD-10-CM | POA: Diagnosis not present

## 2020-06-27 DIAGNOSIS — I2510 Atherosclerotic heart disease of native coronary artery without angina pectoris: Secondary | ICD-10-CM | POA: Diagnosis not present

## 2020-06-27 DIAGNOSIS — M109 Gout, unspecified: Secondary | ICD-10-CM | POA: Diagnosis not present

## 2020-07-01 DIAGNOSIS — K219 Gastro-esophageal reflux disease without esophagitis: Secondary | ICD-10-CM | POA: Diagnosis not present

## 2020-07-01 DIAGNOSIS — I4819 Other persistent atrial fibrillation: Secondary | ICD-10-CM | POA: Diagnosis not present

## 2020-07-01 DIAGNOSIS — M109 Gout, unspecified: Secondary | ICD-10-CM | POA: Diagnosis not present

## 2020-07-01 DIAGNOSIS — E222 Syndrome of inappropriate secretion of antidiuretic hormone: Secondary | ICD-10-CM | POA: Diagnosis not present

## 2020-07-01 DIAGNOSIS — I10 Essential (primary) hypertension: Secondary | ICD-10-CM | POA: Diagnosis not present

## 2020-07-01 DIAGNOSIS — E860 Dehydration: Secondary | ICD-10-CM | POA: Diagnosis not present

## 2020-07-01 DIAGNOSIS — E440 Moderate protein-calorie malnutrition: Secondary | ICD-10-CM | POA: Diagnosis not present

## 2020-07-01 DIAGNOSIS — I2510 Atherosclerotic heart disease of native coronary artery without angina pectoris: Secondary | ICD-10-CM | POA: Diagnosis not present

## 2020-07-01 DIAGNOSIS — E039 Hypothyroidism, unspecified: Secondary | ICD-10-CM | POA: Diagnosis not present

## 2020-07-03 DIAGNOSIS — E222 Syndrome of inappropriate secretion of antidiuretic hormone: Secondary | ICD-10-CM | POA: Diagnosis not present

## 2020-07-03 DIAGNOSIS — K219 Gastro-esophageal reflux disease without esophagitis: Secondary | ICD-10-CM | POA: Diagnosis not present

## 2020-07-03 DIAGNOSIS — I2510 Atherosclerotic heart disease of native coronary artery without angina pectoris: Secondary | ICD-10-CM | POA: Diagnosis not present

## 2020-07-03 DIAGNOSIS — E039 Hypothyroidism, unspecified: Secondary | ICD-10-CM | POA: Diagnosis not present

## 2020-07-03 DIAGNOSIS — E440 Moderate protein-calorie malnutrition: Secondary | ICD-10-CM | POA: Diagnosis not present

## 2020-07-03 DIAGNOSIS — I10 Essential (primary) hypertension: Secondary | ICD-10-CM | POA: Diagnosis not present

## 2020-07-03 DIAGNOSIS — I4819 Other persistent atrial fibrillation: Secondary | ICD-10-CM | POA: Diagnosis not present

## 2020-07-03 DIAGNOSIS — E860 Dehydration: Secondary | ICD-10-CM | POA: Diagnosis not present

## 2020-07-03 DIAGNOSIS — M109 Gout, unspecified: Secondary | ICD-10-CM | POA: Diagnosis not present

## 2020-07-04 DIAGNOSIS — E039 Hypothyroidism, unspecified: Secondary | ICD-10-CM | POA: Diagnosis not present

## 2020-07-04 DIAGNOSIS — E440 Moderate protein-calorie malnutrition: Secondary | ICD-10-CM | POA: Diagnosis not present

## 2020-07-04 DIAGNOSIS — E860 Dehydration: Secondary | ICD-10-CM | POA: Diagnosis not present

## 2020-07-04 DIAGNOSIS — I10 Essential (primary) hypertension: Secondary | ICD-10-CM | POA: Diagnosis not present

## 2020-07-04 DIAGNOSIS — I4819 Other persistent atrial fibrillation: Secondary | ICD-10-CM | POA: Diagnosis not present

## 2020-07-04 DIAGNOSIS — E222 Syndrome of inappropriate secretion of antidiuretic hormone: Secondary | ICD-10-CM | POA: Diagnosis not present

## 2020-07-04 DIAGNOSIS — M109 Gout, unspecified: Secondary | ICD-10-CM | POA: Diagnosis not present

## 2020-07-04 DIAGNOSIS — K219 Gastro-esophageal reflux disease without esophagitis: Secondary | ICD-10-CM | POA: Diagnosis not present

## 2020-07-04 DIAGNOSIS — I2510 Atherosclerotic heart disease of native coronary artery without angina pectoris: Secondary | ICD-10-CM | POA: Diagnosis not present

## 2020-07-06 ENCOUNTER — Inpatient Hospital Stay
Admission: EM | Admit: 2020-07-06 | Discharge: 2020-07-10 | DRG: 394 | Disposition: A | Discharge status: Home Health | Payer: Medicare PPO | Attending: Internal Medicine | Admitting: Internal Medicine

## 2020-07-06 ENCOUNTER — Other Ambulatory Visit: Payer: Self-pay

## 2020-07-06 ENCOUNTER — Encounter: Payer: Self-pay | Admitting: Emergency Medicine

## 2020-07-06 ENCOUNTER — Emergency Department: Payer: Medicare PPO

## 2020-07-06 DIAGNOSIS — K429 Umbilical hernia without obstruction or gangrene: Secondary | ICD-10-CM | POA: Diagnosis present

## 2020-07-06 DIAGNOSIS — Z8249 Family history of ischemic heart disease and other diseases of the circulatory system: Secondary | ICD-10-CM

## 2020-07-06 DIAGNOSIS — Z79890 Hormone replacement therapy (postmenopausal): Secondary | ICD-10-CM

## 2020-07-06 DIAGNOSIS — Z823 Family history of stroke: Secondary | ICD-10-CM

## 2020-07-06 DIAGNOSIS — D490 Neoplasm of unspecified behavior of digestive system: Secondary | ICD-10-CM | POA: Diagnosis present

## 2020-07-06 DIAGNOSIS — K6289 Other specified diseases of anus and rectum: Secondary | ICD-10-CM | POA: Diagnosis not present

## 2020-07-06 DIAGNOSIS — G589 Mononeuropathy, unspecified: Secondary | ICD-10-CM | POA: Diagnosis present

## 2020-07-06 DIAGNOSIS — I700 Atherosclerosis of aorta: Secondary | ICD-10-CM | POA: Diagnosis present

## 2020-07-06 DIAGNOSIS — E039 Hypothyroidism, unspecified: Secondary | ICD-10-CM | POA: Diagnosis present

## 2020-07-06 DIAGNOSIS — Z820 Family history of epilepsy and other diseases of the nervous system: Secondary | ICD-10-CM

## 2020-07-06 DIAGNOSIS — K862 Cyst of pancreas: Secondary | ICD-10-CM | POA: Diagnosis not present

## 2020-07-06 DIAGNOSIS — K5900 Constipation, unspecified: Secondary | ICD-10-CM | POA: Diagnosis present

## 2020-07-06 DIAGNOSIS — Z6821 Body mass index (BMI) 21.0-21.9, adult: Secondary | ICD-10-CM

## 2020-07-06 DIAGNOSIS — Z20822 Contact with and (suspected) exposure to covid-19: Secondary | ICD-10-CM | POA: Diagnosis present

## 2020-07-06 DIAGNOSIS — K8689 Other specified diseases of pancreas: Secondary | ICD-10-CM | POA: Diagnosis not present

## 2020-07-06 DIAGNOSIS — M1990 Unspecified osteoarthritis, unspecified site: Secondary | ICD-10-CM | POA: Diagnosis present

## 2020-07-06 DIAGNOSIS — I4819 Other persistent atrial fibrillation: Secondary | ICD-10-CM | POA: Diagnosis present

## 2020-07-06 DIAGNOSIS — Z888 Allergy status to other drugs, medicaments and biological substances status: Secondary | ICD-10-CM

## 2020-07-06 DIAGNOSIS — D72829 Elevated white blood cell count, unspecified: Secondary | ICD-10-CM | POA: Diagnosis present

## 2020-07-06 DIAGNOSIS — E669 Obesity, unspecified: Secondary | ICD-10-CM | POA: Diagnosis present

## 2020-07-06 DIAGNOSIS — K922 Gastrointestinal hemorrhage, unspecified: Secondary | ICD-10-CM | POA: Diagnosis present

## 2020-07-06 DIAGNOSIS — M069 Rheumatoid arthritis, unspecified: Secondary | ICD-10-CM | POA: Diagnosis present

## 2020-07-06 DIAGNOSIS — Z833 Family history of diabetes mellitus: Secondary | ICD-10-CM

## 2020-07-06 DIAGNOSIS — D649 Anemia, unspecified: Secondary | ICD-10-CM | POA: Diagnosis present

## 2020-07-06 DIAGNOSIS — E782 Mixed hyperlipidemia: Secondary | ICD-10-CM | POA: Diagnosis present

## 2020-07-06 DIAGNOSIS — K640 First degree hemorrhoids: Secondary | ICD-10-CM | POA: Diagnosis not present

## 2020-07-06 DIAGNOSIS — Z7982 Long term (current) use of aspirin: Secondary | ICD-10-CM

## 2020-07-06 DIAGNOSIS — K449 Diaphragmatic hernia without obstruction or gangrene: Secondary | ICD-10-CM | POA: Diagnosis present

## 2020-07-06 DIAGNOSIS — Z831 Family history of other infectious and parasitic diseases: Secondary | ICD-10-CM

## 2020-07-06 DIAGNOSIS — I10 Essential (primary) hypertension: Secondary | ICD-10-CM | POA: Diagnosis present

## 2020-07-06 DIAGNOSIS — K219 Gastro-esophageal reflux disease without esophagitis: Secondary | ICD-10-CM | POA: Diagnosis present

## 2020-07-06 DIAGNOSIS — Z79899 Other long term (current) drug therapy: Secondary | ICD-10-CM

## 2020-07-06 DIAGNOSIS — E079 Disorder of thyroid, unspecified: Secondary | ICD-10-CM | POA: Diagnosis present

## 2020-07-06 DIAGNOSIS — Z7901 Long term (current) use of anticoagulants: Secondary | ICD-10-CM

## 2020-07-06 DIAGNOSIS — M109 Gout, unspecified: Secondary | ICD-10-CM | POA: Diagnosis present

## 2020-07-06 LAB — TYPE AND SCREEN
ABO/RH(D): O POS
Antibody Screen: NEGATIVE

## 2020-07-06 LAB — CBC WITH DIFFERENTIAL/PLATELET
Abs Immature Granulocytes: 0.06 10*3/uL (ref 0.00–0.07)
Basophils Absolute: 0 10*3/uL (ref 0.0–0.1)
Basophils Relative: 0 %
Eosinophils Absolute: 0.1 10*3/uL (ref 0.0–0.5)
Eosinophils Relative: 1 %
HCT: 38.4 % — ABNORMAL LOW (ref 39.0–52.0)
Hemoglobin: 13.1 g/dL (ref 13.0–17.0)
Immature Granulocytes: 0 %
Lymphocytes Relative: 12 %
Lymphs Abs: 1.6 10*3/uL (ref 0.7–4.0)
MCH: 31.9 pg (ref 26.0–34.0)
MCHC: 34.1 g/dL (ref 30.0–36.0)
MCV: 93.4 fL (ref 80.0–100.0)
Monocytes Absolute: 0.9 10*3/uL (ref 0.1–1.0)
Monocytes Relative: 6 %
Neutro Abs: 11.2 10*3/uL — ABNORMAL HIGH (ref 1.7–7.7)
Neutrophils Relative %: 81 %
Platelets: 260 10*3/uL (ref 150–400)
RBC: 4.11 MIL/uL — ABNORMAL LOW (ref 4.22–5.81)
RDW: 14.2 % (ref 11.5–15.5)
WBC: 13.9 10*3/uL — ABNORMAL HIGH (ref 4.0–10.5)
nRBC: 0 % (ref 0.0–0.2)

## 2020-07-06 LAB — SARS CORONAVIRUS 2 BY RT PCR (HOSPITAL ORDER, PERFORMED IN ~~LOC~~ HOSPITAL LAB): SARS Coronavirus 2: NEGATIVE

## 2020-07-06 LAB — COMPREHENSIVE METABOLIC PANEL
ALT: 18 U/L (ref 0–44)
AST: 17 U/L (ref 15–41)
Albumin: 4.1 g/dL (ref 3.5–5.0)
Alkaline Phosphatase: 99 U/L (ref 38–126)
Anion gap: 10 (ref 5–15)
BUN: 12 mg/dL (ref 8–23)
CO2: 28 mmol/L (ref 22–32)
Calcium: 9.2 mg/dL (ref 8.9–10.3)
Chloride: 101 mmol/L (ref 98–111)
Creatinine, Ser: 0.85 mg/dL (ref 0.61–1.24)
GFR calc Af Amer: >60 mL/min (ref >60)
GFR calc non Af Amer: >60 mL/min (ref >60)
Glucose, Bld: 107 mg/dL — ABNORMAL HIGH (ref 70–99)
Potassium: 3.9 mmol/L (ref 3.5–5.1)
Sodium: 139 mmol/L (ref 135–145)
Total Bilirubin: 0.7 mg/dL (ref 0.3–1.2)
Total Protein: 7.6 g/dL (ref 6.5–8.1)

## 2020-07-06 LAB — CBC
HCT: 41.1 % (ref 39.0–52.0)
Hemoglobin: 14 g/dL (ref 13.0–17.0)
MCH: 32 pg (ref 26.0–34.0)
MCHC: 34.1 g/dL (ref 30.0–36.0)
MCV: 93.8 fL (ref 80.0–100.0)
Platelets: 271 10*3/uL (ref 150–400)
RBC: 4.38 MIL/uL (ref 4.22–5.81)
RDW: 14.3 % (ref 11.5–15.5)
WBC: 11.1 10*3/uL — ABNORMAL HIGH (ref 4.0–10.5)
nRBC: 0 % (ref 0.0–0.2)

## 2020-07-06 IMAGING — CT CT ABD-PELV W/ CM
2 of 5 series · 14 of 46 positions shown, 16 images · IV contrast (APPLIED)
Comparison: Abdomen and pelvis of [DATE]

CLINICAL DATA: Diverticulitis suspected

EXAM:
CT ABDOMEN AND PELVIS WITH CONTRAST
TECHNIQUE: Multidetector CT imaging of the abdomen and pelvis was performed
using the standard protocol following bolus administration of
intravenous contrast.
CONTRAST:  100mL OMNIPAQUE IOHEXOL 300 MG/ML  SOLN

[Series 2: routine abd/pel with · axial · 0.80mm/px · z∈[-291,+159]mm · 11 of 102 slices shown, 13 images]
[im 6/102  soft-tissue]
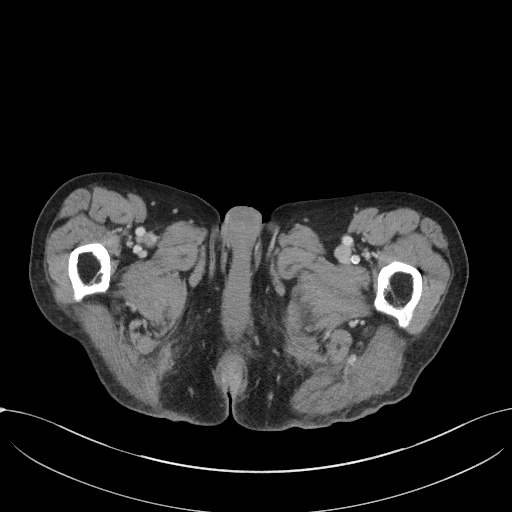
[im 6/102  bone]
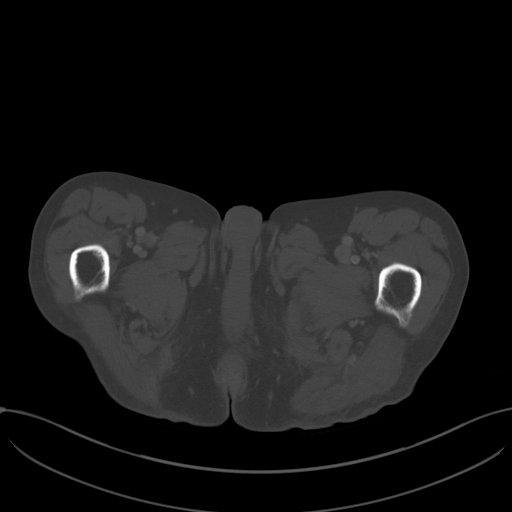
[im 16/102  soft-tissue]
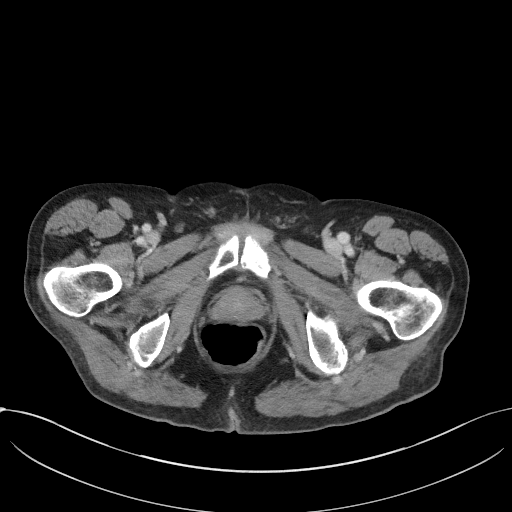
[im 27/102  soft-tissue]
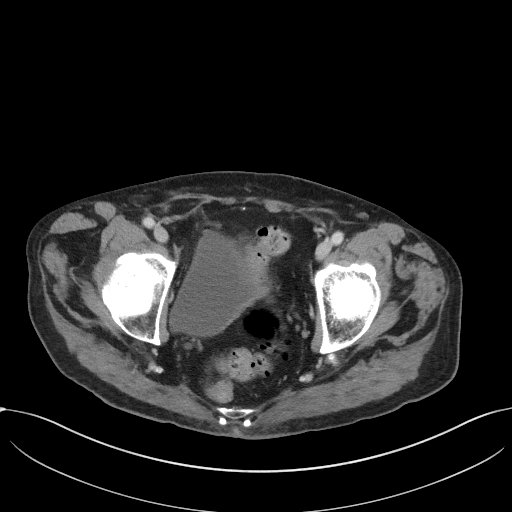
[im 32/102  soft-tissue]
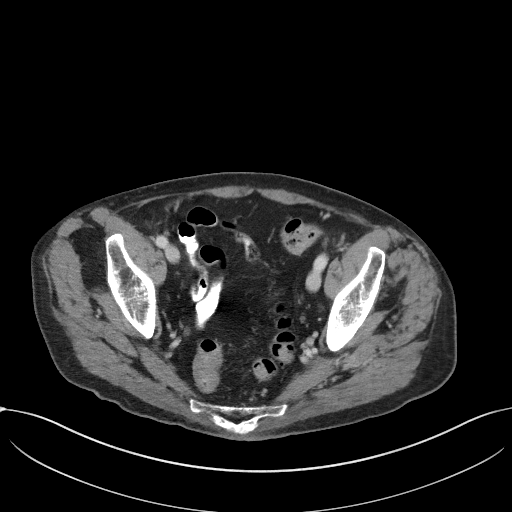
[im 43/102  soft-tissue]
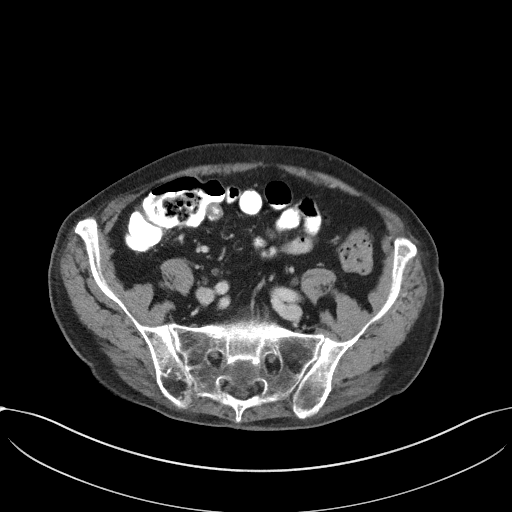
[im 54/102  soft-tissue]
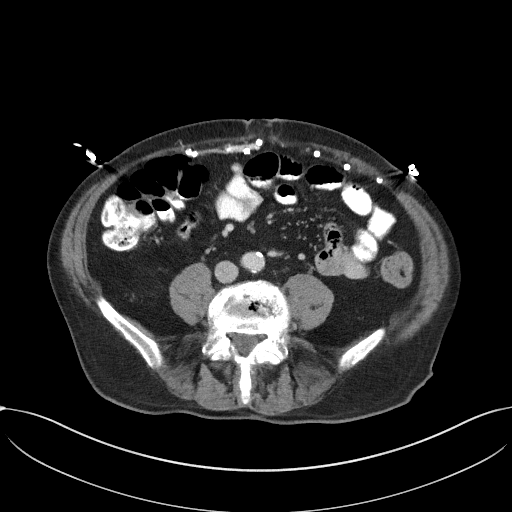
[im 59/102  soft-tissue]
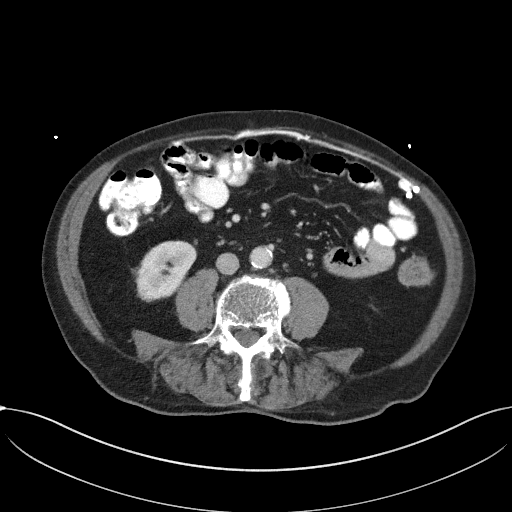
[im 70/102  soft-tissue]
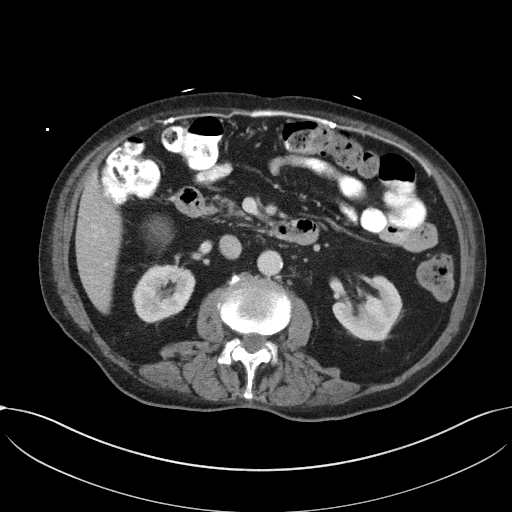
[im 75/102  soft-tissue]
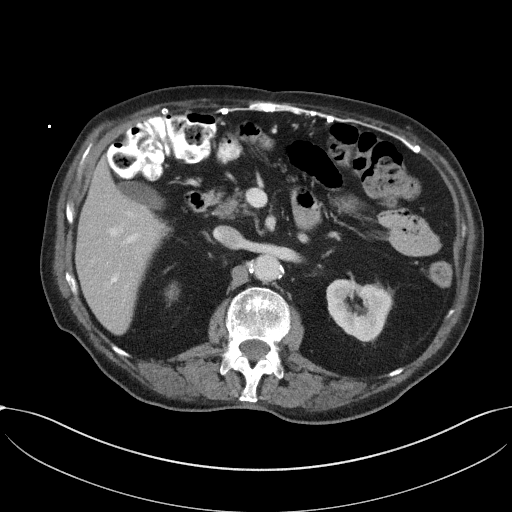
[im 75/102  bone]
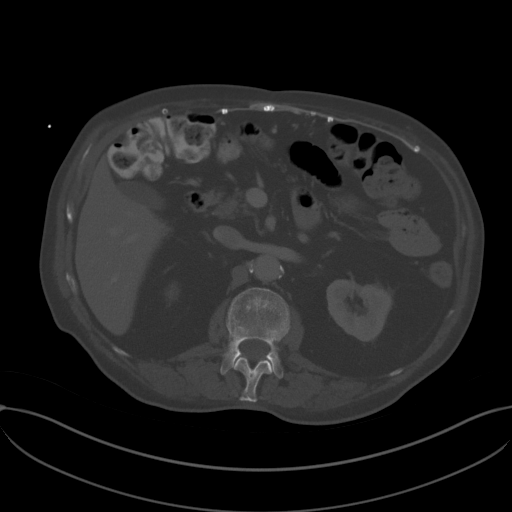
[im 86/102  soft-tissue]
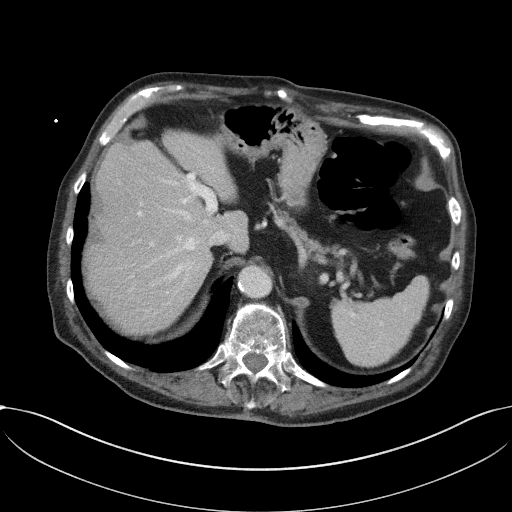
[im 96/102  soft-tissue]
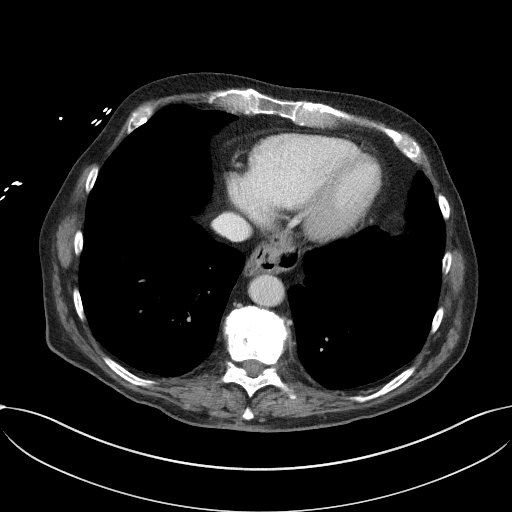

[Series 5: coronal st · coronal · 0.73mm/px · 3 of 99 slices shown]
[im 33/99  soft-tissue]
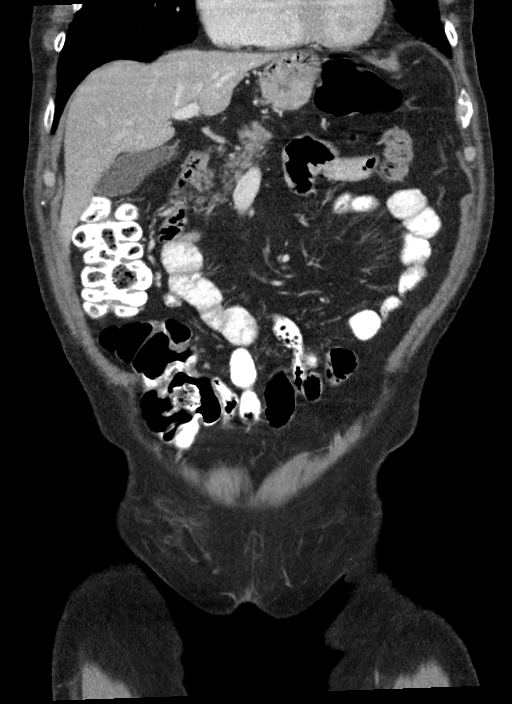
[im 44/99  soft-tissue]
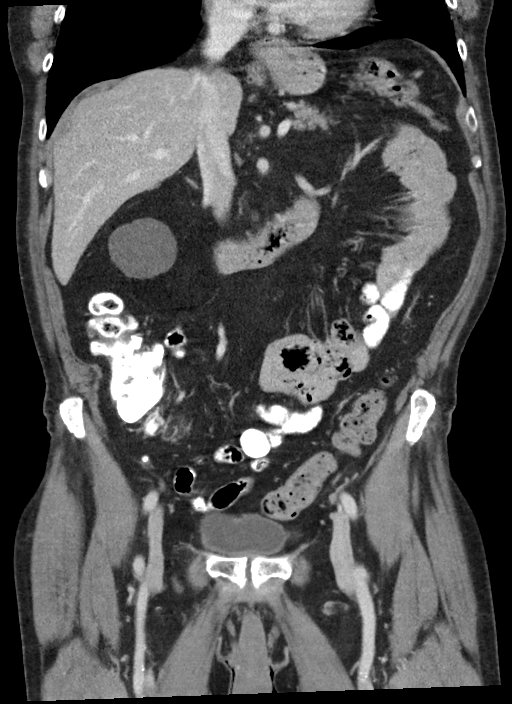
[im 55/99  soft-tissue]
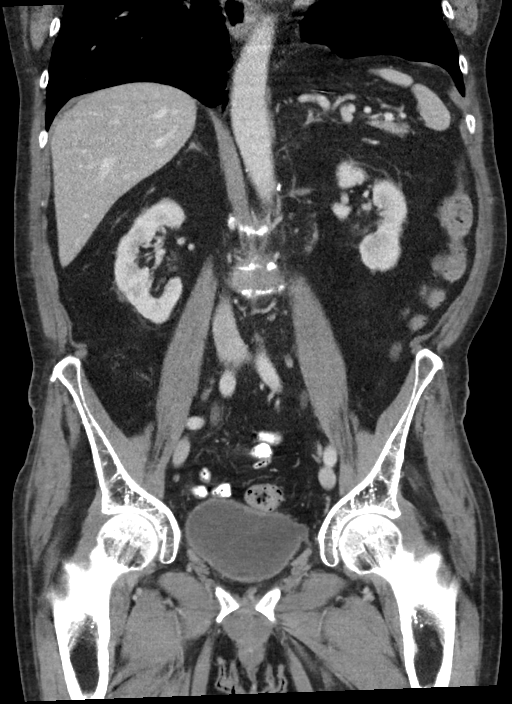

[14 of 46 positions shown; findings below may reference images not displayed]

FINDINGS: Lower chest: Incidental imaging of the lung bases is unremarkable.
No pericardial effusion. Heart is incompletely imaged.

Hepatobiliary: Liver without focal, suspicious hepatic lesion. No
pericholecystic stranding. No biliary duct dilation.

Pancreas: Pancreas with small cystic focus in the neck of the
pancreas unchanged since previous study [DATE] approximately
1.4 cm with an adjacent smaller, subcentimeter focus in the
pancreas. No current peripancreatic stranding.

Spleen: Spleen normal in size and contour with splenic
calcification.

Adrenals/Urinary Tract: Adrenal glands are normal.

RIGHT renal cyst, moderately large unchanged. No hydronephrosis. No
nephrolithiasis. No visible ureteral calculi. Bladder diverticuli
with similar appearance to the prior study.

Stomach/Bowel: Signs of Nissen fundoplication partially herniated
into the chest. Mildly patulous appearance of the distal esophagus.
No acute small bowel process. The appendix is normal.

Colon largely stool filled. Fullness of the sphincter complex with
mild stranding in the perianal area. No fluid collection. Thickening
along the upper margin of the sphincter complex in the distal rectum
with nodular features. This is just above the pelvic floor and
extending into the sphincter complex.

Vascular/Lymphatic: Calcified and noncalcified atheromatous plaque
of the abdominal aorta. There is no gastrohepatic or hepatoduodenal
ligament lymphadenopathy. No retroperitoneal or mesenteric
lymphadenopathy.

No pelvic sidewall lymphadenopathy.

Reproductive: Unremarkable CT appearance of the prostate.

Other: Signs of ventral abdominal wall reconstruction with mesh.
Small umbilical hernia at just below the margin of the
reconstructive changes not changed from previous exam.

Musculoskeletal: Spinal degenerative changes. Osteopenia. No acute
musculoskeletal process.
IMPRESSION: 1. Nodular changes of the distal rectum and upper anal canal with
some suggestion of inflammation about the perineum. Findings may be
due to acute proctitis and anal inflammation. A low rectal
neoplasm/malignancy is also considered. Correlation with digital
rectal examination may be helpful. Follow-up with direct
visualization is suggested.
2. Signs of Nissen fundoplication partially herniated into the
chest. Mildly patulous appearance of the distal esophagus. Correlate
with any upper abdominal symptoms.
3. Bladder diverticuli with similar appearance to the prior study.
4. Small cystic focus in the neck of the pancreas unchanged since
[DATE] approximately 1.4 cm with an adjacent smaller,
subcentimeter focus in the pancreas. Follow-up with MRI/MRCP in 1
year is suggested.
5. Aortic atherosclerosis.
6. Signs of ventral abdominal wall reconstruction with mesh. Small
umbilical hernia at just below the margin of the reconstructive
changes not changed from previous exam.

Aortic Atherosclerosis ([6Z]-[6Z]).

## 2020-07-06 MED ORDER — SODIUM CHLORIDE 0.9% FLUSH
3.0000 mL | Freq: Two times a day (BID) | INTRAVENOUS | Status: DC
  Administered 2020-07-06 – 2020-07-10 (×8): 3 mL via INTRAVENOUS

## 2020-07-06 MED ORDER — ALLOPURINOL 300 MG PO TABS
300.0000 mg | ORAL_TABLET | Freq: Every day | ORAL | Status: DC
  Administered 2020-07-06 – 2020-07-10 (×4): 300 mg via ORAL
  Filled 2020-07-06 (×6): qty 1

## 2020-07-06 MED ORDER — DILTIAZEM HCL ER COATED BEADS 180 MG PO CP24
180.0000 mg | ORAL_CAPSULE | Freq: Every day | ORAL | Status: DC
  Administered 2020-07-07 – 2020-07-10 (×3): 180 mg via ORAL
  Filled 2020-07-06 (×5): qty 1

## 2020-07-06 MED ORDER — GABAPENTIN 100 MG PO CAPS
200.0000 mg | ORAL_CAPSULE | Freq: Every day | ORAL | Status: DC
  Administered 2020-07-06 – 2020-07-09 (×4): 200 mg via ORAL
  Filled 2020-07-06 (×4): qty 2

## 2020-07-06 MED ORDER — ONDANSETRON HCL 4 MG PO TABS: 4.0000 mg | ORAL_TABLET | Freq: Four times a day (QID) | ORAL | Status: DC | PRN

## 2020-07-06 MED ORDER — AMOXICILLIN-POT CLAVULANATE 875-125 MG PO TABS
1.0000 | ORAL_TABLET | Freq: Once | ORAL | Status: AC
  Administered 2020-07-06: 1 via ORAL
  Filled 2020-07-06: qty 1

## 2020-07-06 MED ORDER — ACETAMINOPHEN 650 MG RE SUPP: 650.0000 mg | Freq: Four times a day (QID) | RECTAL | Status: DC | PRN

## 2020-07-06 MED ORDER — MAGNESIUM HYDROXIDE 400 MG/5ML PO SUSP
30.0000 mL | Freq: Once | ORAL | Status: DC
  Filled 2020-07-06: qty 30

## 2020-07-06 MED ORDER — IOHEXOL 300 MG/ML  SOLN
100.0000 mL | Freq: Once | INTRAMUSCULAR | Status: AC | PRN
  Administered 2020-07-06: 100 mL via INTRAVENOUS

## 2020-07-06 MED ORDER — ATORVASTATIN CALCIUM 20 MG PO TABS
40.0000 mg | ORAL_TABLET | Freq: Every day | ORAL | Status: DC
  Administered 2020-07-06 – 2020-07-10 (×4): 40 mg via ORAL
  Filled 2020-07-06 (×5): qty 2

## 2020-07-06 MED ORDER — IOHEXOL 9 MG/ML PO SOLN
500.0000 mL | Freq: Two times a day (BID) | ORAL | Status: DC | PRN
  Filled 2020-07-06: qty 500

## 2020-07-06 MED ORDER — LEVOTHYROXINE SODIUM 88 MCG PO TABS
88.0000 ug | ORAL_TABLET | Freq: Every day | ORAL | Status: DC
  Administered 2020-07-08 – 2020-07-10 (×3): 88 ug via ORAL
  Filled 2020-07-06 (×4): qty 1

## 2020-07-06 MED ORDER — ONDANSETRON HCL 4 MG/2ML IJ SOLN: 4.0000 mg | Freq: Four times a day (QID) | INTRAMUSCULAR | Status: DC | PRN

## 2020-07-06 MED ORDER — ACETAMINOPHEN 325 MG PO TABS: 650.0000 mg | ORAL_TABLET | Freq: Four times a day (QID) | ORAL | Status: DC | PRN

## 2020-07-06 MED ORDER — PANTOPRAZOLE SODIUM 40 MG PO TBEC
40.0000 mg | DELAYED_RELEASE_TABLET | Freq: Every day | ORAL | Status: DC
  Administered 2020-07-06 – 2020-07-10 (×4): 40 mg via ORAL
  Filled 2020-07-06 (×5): qty 1

## 2020-07-06 MED ORDER — AMLODIPINE BESYLATE 10 MG PO TABS
10.0000 mg | ORAL_TABLET | Freq: Every day | ORAL | Status: DC
  Administered 2020-07-07 – 2020-07-10 (×3): 10 mg via ORAL
  Filled 2020-07-06 (×3): qty 1
  Filled 2020-07-06 (×2): qty 2

## 2020-07-06 NOTE — ED Notes (Signed)
Pt to ED for rectal bleeding. Pt is in NAD

## 2020-07-06 NOTE — ED Provider Notes (Signed)
[facility] Emergency Department Provider Note ____________________________________________   First MD Initiated Contact with Patient 07/06/20 1203     (approximate)  I have reviewed the triage vital signs and the nursing notes.   HISTORY  Chief Complaint Rectal Bleeding    HPI BRAYDEN BRODHEAD is a 79 y.o. male with PMH as noted below who presents with blood in the stool since yesterday, several episodes, described as bright red blood, and associated with some lower abdominal pain.  The patient denies any diarrhea, fever chills, or vomiting.  He has no known prior history of GI bleeding.  He is on Xarelto, and states he took his last dose last night.  He also took aspirin yesterday.  Past Medical History:  Diagnosis Date  . Arthritis   . GERD (gastroesophageal reflux disease)   . Hypertension   . Rheumatoid arthritis ([place])   . Thyroid disease     Patient Active Problem List   Diagnosis Date Noted  . Persistent atrial fibrillation ([place])   . Malnutrition of moderate degree 05/20/2020  . Abdominal pain 05/19/2020  . Hyponatremia 05/19/2020  . AKI (acute kidney injury) ([place]) 05/19/2020  . Acute urinary retention 05/19/2020  . Generalized weakness 05/19/2020  . Aortic atherosclerosis ([place]) 04/26/2020  . Foot pain, bilateral 04/26/2020  . Acquired hypothyroidism 03/15/2020  . Essential hypertension 03/15/2020  . 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 Mixed hyperlipidemia 03/15/2020  . Elevated hemoglobin A1c 03/15/2020  . 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 CARDIAC SURGERY    . SHOULDER SURGERY Right 2016  . TYMPANOPLASTY Right 1987  . vocal cords  1989   polyps    Prior to Admission medications   Medication Sig Start Date End Date Taking? Authorizing Provider  acetaminophen (TYLENOL) 500 MG tablet Take by mouth.    [provider]  allopurinol (ZYLOPRIM) 300 MG tablet Take 300  mg by mouth daily.    [provider]  amLODipine (NORVASC) 10 MG tablet Take 10 mg by mouth daily.    [provider]  aspirin 81 MG chewable tablet Chew 81 mg by mouth daily.    [provider]  atorvastatin (LIPITOR) 40 MG tablet Take 1 tablet (40 mg total) by mouth daily. 04/18/20   Karamalegos, Devonne Doughty, DO  diltiazem (CARDIZEM CD) 180 MG 24 hr capsule Take 1 capsule (180 mg total) by mouth daily. 06/26/20   Karamalegos, Devonne Doughty, DO  gabapentin (NEURONTIN) 100 MG capsule Start 1 capsule daily at bed time, increase by 1 cap every 2-3 days as tolerated up to take 3 at once in evening. Patient taking differently: Take 200 mg by mouth at bedtime.  04/18/20   Karamalegos, Devonne Doughty, DO  levothyroxine (SYNTHROID) 88 MCG tablet Take 1 tablet (88 mcg total) by mouth daily. 03/27/20   Karamalegos, Devonne Doughty, DO  omeprazole (PRILOSEC) 20 MG capsule Take 1 capsule (20 mg total) by mouth daily. 04/18/20   Karamalegos, Devonne Doughty, DO  ondansetron (ZOFRAN ODT) 4 MG disintegrating tablet Take 1 tablet (4 mg total) by mouth every 8 (eight) hours as needed for nausea or vomiting. 04/26/20   Karamalegos, Devonne Doughty, DO  Rivaroxaban (XARELTO) 20 MG TABS tablet Take 1 tablet (20 mg total) by mouth daily with supper. 06/26/20   Karamalegos, Devonne Doughty, DO  senna-docusate (SENOKOT-S) 8.6-50 MG tablet Take 2 tablets by mouth 2 (two) times daily. 05/24/20   Sharen Hones, MD  Allergies Ace inhibitors and Prednisone  Family History  Problem Relation Age of Onset  . Hypertension Mother   . Osteoarthritis Mother   . Parkinson's disease Mother   . Heart attack Father 41  . Tuberculosis Father   . Stroke Brother   . Diabetes Brother     Social History Social History   Tobacco Use  . Smoking status: Never Smoker  . Smokeless tobacco: Never Used  Vaping Use  . Vaping Use: Never used  Substance Use Topics  . Alcohol use: Never    Alcohol/week: 1.0 standard drink    Types: 1  Glasses of wine per week  . Drug use: Never    Review of Systems  Constitutional: No fever/chills. Eyes: No visual changes. ENT: No sore throat. Cardiovascular: Denies chest pain. Respiratory: Denies shortness of breath. Gastrointestinal: No vomiting. Genitourinary: Negative for dysuria or hematuria.  Musculoskeletal: Negative for back pain. Skin: Negative for rash. Neurological: Negative for headache.   ____________________________________________   PHYSICAL EXAM:  VITAL SIGNS: ED Triage Vitals  Enc Vitals Group     BP 07/06/20 0802 137/77     Pulse Rate 07/06/20 0802 79     Resp 07/06/20 0802 16     Temp 07/06/20 0802 98.1 F (36.7 C)     Temp Source 07/06/20 0802 Oral     SpO2 07/06/20 0801 99 %     Weight 07/06/20 0802 175 lb (79.4 kg)     Height 07/06/20 0802 6\' 3"  (1.905 m)     Head Circumference --      Peak Flow --      Pain Score 07/06/20 0802 3     Pain Loc --      Pain Edu? --      Excl. in [place]? --     Constitutional: Alert and oriented.  Relatively well appearing and in no acute distress. Eyes: Conjunctivae are normal.  Head: Atraumatic. Nose: No congestion/rhinnorhea. Mouth/Throat: Mucous membranes are moist.   Neck: Normal range of motion.  Cardiovascular: Normal rate, regular rhythm.   Good peripheral circulation. Respiratory: Normal respiratory effort.  No retractions. Gastrointestinal: Soft with mild bilateral lower quadrant tenderness.  No distention.  Genitourinary: No flank tenderness. Musculoskeletal: Extremities warm and well perfused.  Neurologic:  Normal speech and language. No gross focal neurologic deficits are appreciated.  Skin:  Skin is warm and dry. No rash noted. Psychiatric: Mood and affect are normal. Speech and behavior are normal.  ____________________________________________   LABS (all labs ordered are listed, but only abnormal results are displayed)  Labs Reviewed  COMPREHENSIVE METABOLIC PANEL - Abnormal; Notable  for the following components:      Result Value   Glucose, Bld 107 (*)    All other components within normal limits  CBC - Abnormal; Notable for the following components:   WBC 11.1 (*)    All other components within normal limits  CBC WITH DIFFERENTIAL/PLATELET - Abnormal; Notable for the following components:   WBC 13.9 (*)    RBC 4.11 (*)    HCT 38.4 (*)    Neutro Abs 11.2 (*)    All other components within normal limits  SARS CORONAVIRUS 2 BY RT PCR (HOSPITAL ORDER, [place] [facility])  POC OCCULT BLOOD, ED  TYPE AND SCREEN   ____________________________________________  EKG   ____________________________________________  RADIOLOGY  CT abdomen/pelvis: Proctitis, cannot rule out malignancy  ____________________________________________   PROCEDURES  Procedure(s) performed: No  Procedures  Critical Care performed: No  ____________________________________________   INITIAL IMPRESSION / ASSESSMENT AND PLAN / ED COURSE  Pertinent labs & imaging results that were available during my care of the patient were reviewed by me and considered in my medical decision making (see chart for details).  79 year old male with PMH as noted above presents with several episodes of bright red blood per rectum over the last day.  He also has some lower abdominal pain.  He denies any prior history of GI bleed.  He is on Xarelto and aspirin.  I reviewed the past medical records in Epic; the patient was most recently admitted in late July with generalized weakness, diagnosed with hyponatremia as well as AKI and urinary retention.  On exam today, the patient is overall well-appearing.  His vital signs are normal.  He has mild bilateral lower quadrant abdominal tenderness.  On DRE, there is no visible external hemorrhoid.  He has brown stool with some red blood mixed in which is guaiac positive.  Differential includes diverticulosis, diverticulitis, colitis, internal  hemorrhoid.  There is no evidence of upper GI source.  We will obtain a CT due to the presence of abdominal pain and tenderness, and plan for admission for further observation.  ----------------------------------------- 3:39 PM on 07/06/2020 -----------------------------------------  CT shows evidence of proctitis.  A mass cannot be ruled out.  The hemoglobin dropped one point on repeat.  The patient has not had another bowel movement in the last few hours.  We will admit for observation.  I discussed the case with the hospitalist. ____________________________________________   FINAL CLINICAL IMPRESSION(S) / ED DIAGNOSES  Final diagnoses:  Lower GI bleed  Proctitis      NEW MEDICATIONS STARTED DURING THIS VISIT:  New Prescriptions   No medications on file     Note:  This document was prepared using Dragon voice recognition software and may include unintentional dictation errors.    Arta Silence, MD 07/06/20 1540

## 2020-07-06 NOTE — H&P (Addendum)
History and Physical    Arthur Romero:[id number] DOB: Sep 29, 1941 DOA: 07/06/2020  PCP: Olin Hauser, DO   Patient coming from: Home and independent in ADLs  I have personally briefly reviewed patient's old medical records in [place]  Chief Complaint: Bleeding per rectum  HPI: Arthur Romero is a 79 y.o. male with medical history significant of hypertension, persistent atrial fibrillation, rheumatoid arthritis, thyroid disease and GERD presented to ED with complaint of bleeding per rectum. Patient saw some blood initially during wiping after using the toilet yesterday, bleeding increased to the point that he can see blood in the toilet bowl.  He was complaining of mild discomfort in the rectal area.  Denies any fever or chills.  He was also complaining of some constipation for which he took some MiraLAX and Colace and does not think that he had a good response.  He had multiple episodes of small bowel movements since morning each with small amount of blood.  His appetite is okay.  No other symptoms.  Denies any upper respiratory symptoms or sick contacts.  He is fully vaccinated.  No nausea, vomiting.  No chest pain or shortness of breath.  No urinary symptoms.  Patient was taking Xarelto for A. fib along with baby aspirin.  Never had any similar episodes before.  ED Course: Hemodynamically stable.  COVID-19 negative, labs with hemoglobin of 14 on arrival and decreased to 13 when repeated after few hours.  leukocytosis at 13.9, CT abdomen with some nodular changes in distal rectum and upper anal canal which can be either due to acute proctitis and anal inflammation.  Low rectal neoplasm/malignancy cannot be ruled out. [facility] was asked admission to keep him under observation. GI was also consulted.  Review of Systems: As per HPI otherwise 10 point review of systems negative.   Past Medical History:  Diagnosis Date  . Arthritis   . GERD (gastroesophageal reflux  disease)   . Hypertension   . Rheumatoid arthritis ([place])   . Thyroid disease     Past Surgical History:  Procedure Laterality Date  . CARDIAC SURGERY    . SHOULDER SURGERY Right 2016  . TYMPANOPLASTY Right 1987  . vocal cords  1989   polyps     reports that he has never smoked. He has never used smokeless tobacco. He reports that he does not drink alcohol and does not use drugs.  Allergies  Allergen Reactions  . Ace Inhibitors Swelling    Lip swelling Lip swelling Lip swelling   . Prednisone Hives    Family History  Problem Relation Age of Onset  . Hypertension Mother   . Osteoarthritis Mother   . Parkinson's disease Mother   . Heart attack Father 24  . Tuberculosis Father   . Stroke Brother   . Diabetes Brother     Prior to Admission medications   Medication Sig Start Date End Date Taking? Authorizing Provider  acetaminophen (TYLENOL) 500 MG tablet Take by mouth.    [provider]  allopurinol (ZYLOPRIM) 300 MG tablet Take 300 mg by mouth daily.    [provider]  amLODipine (NORVASC) 10 MG tablet Take 10 mg by mouth daily.    [provider]  aspirin 81 MG chewable tablet Chew 81 mg by mouth daily.    [provider]  atorvastatin (LIPITOR) 40 MG tablet Take 1 tablet (40 mg total) by mouth daily. 04/18/20   Karamalegos, Devonne Doughty, DO  diltiazem (CARDIZEM CD)  180 MG 24 hr capsule Take 1 capsule (180 mg total) by mouth daily. 06/26/20   Karamalegos, Devonne Doughty, DO  gabapentin (NEURONTIN) 100 MG capsule Start 1 capsule daily at bed time, increase by 1 cap every 2-3 days as tolerated up to take 3 at once in evening. Patient taking differently: Take 200 mg by mouth at bedtime.  04/18/20   Karamalegos, Devonne Doughty, DO  levothyroxine (SYNTHROID) 88 MCG tablet Take 1 tablet (88 mcg total) by mouth daily. 03/27/20   Karamalegos, Devonne Doughty, DO  omeprazole (PRILOSEC) 20 MG capsule Take 1 capsule (20 mg total) by mouth daily. 04/18/20    Karamalegos, Devonne Doughty, DO  ondansetron (ZOFRAN ODT) 4 MG disintegrating tablet Take 1 tablet (4 mg total) by mouth every 8 (eight) hours as needed for nausea or vomiting. 04/26/20   Karamalegos, Devonne Doughty, DO  Rivaroxaban (XARELTO) 20 MG TABS tablet Take 1 tablet (20 mg total) by mouth daily with supper. 06/26/20   Karamalegos, Devonne Doughty, DO  senna-docusate (SENOKOT-S) 8.6-50 MG tablet Take 2 tablets by mouth 2 (two) times daily. 05/24/20   Sharen Hones, MD    Physical Exam: Vitals:   07/06/20 0801 07/06/20 0802 07/06/20 1120 07/06/20 1330  BP:  137/77 132/87 (!) 148/91  Pulse:  79 88 72  Resp:  16 16 20   Temp:  98.1 F (36.7 C)    TempSrc:  Oral    SpO2: 99% 99% 98% 100%  Weight:  79.4 kg    Height:  6\' 3"  (1.905 m)      General: Vital signs reviewed.  Patient is well-developed and well-nourished, in no acute distress and cooperative with exam.  Head: Normocephalic and atraumatic. Eyes: EOMI, conjunctivae normal, no scleral icterus.  ENMT: Mucous membranes are moist.  Neck: Supple, trachea midline, normal ROM,  Cardiovascular: RRR, S1 normal, S2 normal, no murmurs, gallops, or rubs. Pulmonary/Chest: Clear to auscultation bilaterally, no wheezes, rales, or rhonchi. Abdominal: Soft, non-tender, non-distended, BS +,  Extremities: No lower extremity edema bilaterally,  pulses symmetric and intact bilaterally.  Neurological: A&O x3, Strength is normal and symmetric bilaterally, cranial nerve II-XII are grossly intact, no focal motor deficit, sensory intact to light touch bilaterally.  Skin: Warm, dry and intact.  Psychiatric: Normal mood and affect. speech and behavior is normal.   Labs on Admission: I have personally reviewed following labs and imaging studies  CBC: Recent Labs  Lab 07/06/20 0806 07/06/20 1504  WBC 11.1* 13.9*  NEUTROABS  --  11.2*  HGB 14.0 13.1  HCT 41.1 38.4*  MCV 93.8 93.4  PLT 271 098   Basic Metabolic Panel: Recent Labs  Lab 07/06/20 0806  NA  139  K 3.9  CL 101  CO2 28  GLUCOSE 107*  BUN 12  CREATININE 0.85  CALCIUM 9.2   GFR: Estimated Creatinine Clearance: 83 mL/min (by C-G formula based on SCr of 0.85 mg/dL). Liver Function Tests: Recent Labs  Lab 07/06/20 0806  AST 17  ALT 18  ALKPHOS 99  BILITOT 0.7  PROT 7.6  ALBUMIN 4.1   No results for input(s): LIPASE, AMYLASE in the last 168 hours. No results for input(s): AMMONIA in the last 168 hours. Coagulation Profile: No results for input(s): INR, PROTIME in the last 168 hours. Cardiac Enzymes: No results for input(s): CKTOTAL, CKMB, CKMBINDEX, TROPONINI in the last 168 hours. BNP (last 3 results) No results for input(s): PROBNP in the last 8760 hours. HbA1C: No results for input(s): HGBA1C in the last 72  hours. CBG: No results for input(s): GLUCAP in the last 168 hours. Lipid Profile: No results for input(s): CHOL, HDL, LDLCALC, TRIG, CHOLHDL, LDLDIRECT in the last 72 hours. Thyroid Function Tests: No results for input(s): TSH, T4TOTAL, FREET4, T3FREE, THYROIDAB in the last 72 hours. Anemia Panel: No results for input(s): VITAMINB12, FOLATE, FERRITIN, TIBC, IRON, RETICCTPCT in the last 72 hours. Urine analysis:    Component Value Date/Time   COLORURINE YELLOW (A) 05/18/2020 1847   APPEARANCEUR HAZY (A) 05/18/2020 1847   LABSPEC 1.016 05/18/2020 1847   PHURINE 5.0 05/18/2020 1847   GLUCOSEU NEGATIVE 05/18/2020 1847   HGBUR NEGATIVE 05/18/2020 1847   BILIRUBINUR NEGATIVE 05/18/2020 1847   KETONESUR NEGATIVE 05/18/2020 1847   PROTEINUR NEGATIVE 05/18/2020 1847   NITRITE NEGATIVE 05/18/2020 1847   LEUKOCYTESUR NEGATIVE 05/18/2020 1847    Radiological Exams on Admission: CT ABDOMEN PELVIS W CONTRAST  Result Date: 07/06/2020 CLINICAL DATA:  Diverticulitis suspected EXAM: CT ABDOMEN AND PELVIS WITH CONTRAST TECHNIQUE: Multidetector CT imaging of the abdomen and pelvis was performed using the standard protocol following bolus administration of  intravenous contrast. CONTRAST:  183mL OMNIPAQUE IOHEXOL 300 MG/ML  SOLN COMPARISON:  Abdomen and pelvis of May 18, 2020 FINDINGS: Lower chest: Incidental imaging of the lung bases is unremarkable. No pericardial effusion. Heart is incompletely imaged. Hepatobiliary: Liver without focal, suspicious hepatic lesion. No pericholecystic stranding. No biliary duct dilation. Pancreas: Pancreas with small cystic focus in the neck of the pancreas unchanged since previous study May 18, 2020 approximately 1.4 cm with an adjacent smaller, subcentimeter focus in the pancreas. No current peripancreatic stranding. Spleen: Spleen normal in size and contour with splenic calcification. Adrenals/Urinary Tract: Adrenal glands are normal. RIGHT renal cyst, moderately large unchanged. No hydronephrosis. No nephrolithiasis. No visible ureteral calculi. 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 Stomach/Bowel: Signs of Nissen fundoplication partially herniated into the chest. Mildly patulous appearance of the distal esophagus. No acute small bowel process. The appendix is normal. Colon largely stool filled. Fullness of the sphincter complex with mild stranding in the perianal area. No fluid collection. Thickening along the upper margin of the sphincter complex in the distal rectum with nodular features. This is just above the pelvic floor and extending into the sphincter complex. Vascular/Lymphatic: Calcified and noncalcified atheromatous plaque of the abdominal aorta. There is no gastrohepatic or hepatoduodenal ligament lymphadenopathy. No retroperitoneal or mesenteric lymphadenopathy. No pelvic sidewall lymphadenopathy. Reproductive: Unremarkable CT appearance of the prostate. Other: Signs of ventral abdominal wall reconstruction with mesh. Small umbilical hernia at just below the margin of the reconstructive changes not changed from previous exam. Musculoskeletal: Spinal degenerative changes. Osteopenia. No acute  musculoskeletal process. IMPRESSION: 1. Nodular changes of the distal rectum and upper anal canal with some suggestion of inflammation about the perineum. Findings may be due to acute proctitis and anal inflammation. A low rectal neoplasm/malignancy is also considered. Correlation with digital rectal examination may be helpful. Follow-up with direct visualization is suggested. 2. Signs of Nissen fundoplication partially herniated into the chest. Mildly patulous appearance of the distal esophagus. Correlate with any upper abdominal symptoms. 3. 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 4. Small cystic focus in the neck of the pancreas unchanged since May 18, 2020 approximately 1.4 cm with an adjacent smaller, subcentimeter focus in the pancreas. Follow-up with MRI/MRCP in 1 year is suggested. 5. Aortic atherosclerosis. 6. Signs of ventral abdominal wall reconstruction with mesh. Small umbilical hernia at just below the margin of the reconstructive changes  not changed from previous exam. Aortic Atherosclerosis (ICD10-I70.0). Electronically Signed   By: Zetta Bills M.D.   On: 07/06/2020 14:34    Assessment/Plan Active Problems:   Lower GI bleed   Lower GI bleed.  Drop in hemoglobin of 1 unit.  Remained above 13.  No change in BUN or creatinine.  Last dose of Xarelto was yesterday, 07/05/2020. GI was consulted-will appreciate their recommendations.  Sent a message to Dr. Bonna Gains. Monitor under observation in [place]. Follow-up with GI recommendations. Monitor hemoglobin.  Hypertension.  Blood pressure mildly elevated. -Continue home dose of amlodipine and Cardizem.  Atrial fibrillation.  Rate well controlled and appears in sinus rhythm. -Continue home dose of Cardizem. -Hold Xarelto due to rectal bleed.  Hyperlipidemia. -Continue Lipitor -Holding home dose of aspirin.  Hypothyroidism. -Continue Synthroid.   DVT prophylaxis: SCDs Code Status: Full code Family  Communication: Sister was updated at bedside Disposition Plan: Home  Consults called: GI  Admission status: Observation   Lorella Nimrod MD [facility]  If 7PM-7AM, please contact night-coverage [email or web address]  07/06/2020, 3:44 PM   This record has been created using Systems analyst. Errors have been sought and corrected,but may not always be located. Such creation errors do not reflect on the standard of care.

## 2020-07-06 NOTE — ED Triage Notes (Signed)
Pt to ED via POV c/o rectal bleeding x 1 week, pt states that it has been on and off but started back again yesterday. Pt states that yesterday and this morning there was right much blood when he wiped. Pt denies hard stools. Pt states that he is on blood thinners. Pt states that he does have some pain with BMs, pt states that it feels like sore. Pt is in NAD.

## 2020-07-07 DIAGNOSIS — D72829 Elevated white blood cell count, unspecified: Secondary | ICD-10-CM | POA: Diagnosis not present

## 2020-07-07 DIAGNOSIS — D649 Anemia, unspecified: Secondary | ICD-10-CM

## 2020-07-07 DIAGNOSIS — Z9889 Other specified postprocedural states: Secondary | ICD-10-CM | POA: Diagnosis not present

## 2020-07-07 DIAGNOSIS — K6289 Other specified diseases of anus and rectum: Secondary | ICD-10-CM

## 2020-07-07 DIAGNOSIS — I4821 Permanent atrial fibrillation: Secondary | ICD-10-CM | POA: Diagnosis not present

## 2020-07-07 DIAGNOSIS — K922 Gastrointestinal hemorrhage, unspecified: Secondary | ICD-10-CM | POA: Diagnosis not present

## 2020-07-07 DIAGNOSIS — K625 Hemorrhage of anus and rectum: Secondary | ICD-10-CM | POA: Diagnosis not present

## 2020-07-07 DIAGNOSIS — E039 Hypothyroidism, unspecified: Secondary | ICD-10-CM

## 2020-07-07 LAB — CBC
HCT: 37.8 % — ABNORMAL LOW (ref 39.0–52.0)
Hemoglobin: 12.8 g/dL — ABNORMAL LOW (ref 13.0–17.0)
MCH: 32 pg (ref 26.0–34.0)
MCHC: 33.9 g/dL (ref 30.0–36.0)
MCV: 94.5 fL (ref 80.0–100.0)
Platelets: 279 10*3/uL (ref 150–400)
RBC: 4 MIL/uL — ABNORMAL LOW (ref 4.22–5.81)
RDW: 14.4 % (ref 11.5–15.5)
WBC: 13.4 10*3/uL — ABNORMAL HIGH (ref 4.0–10.5)
nRBC: 0 % (ref 0.0–0.2)

## 2020-07-07 LAB — BASIC METABOLIC PANEL
Anion gap: 8 (ref 5–15)
BUN: 13 mg/dL (ref 8–23)
CO2: 28 mmol/L (ref 22–32)
Calcium: 9 mg/dL (ref 8.9–10.3)
Chloride: 105 mmol/L (ref 98–111)
Creatinine, Ser: 0.84 mg/dL (ref 0.61–1.24)
GFR calc Af Amer: >60 mL/min (ref >60)
GFR calc non Af Amer: >60 mL/min (ref >60)
Glucose, Bld: 83 mg/dL (ref 70–99)
Potassium: 4 mmol/L (ref 3.5–5.1)
Sodium: 141 mmol/L (ref 135–145)

## 2020-07-07 MED ORDER — PEG 3350-KCL-NA BICARB-NACL 420 G PO SOLR
4000.0000 mL | Freq: Once | ORAL | Status: AC
  Administered 2020-07-08: 4000 mL via ORAL
  Filled 2020-07-07: qty 4000

## 2020-07-07 MED ORDER — MELATONIN 5 MG PO TABS
5.0000 mg | ORAL_TABLET | Freq: Every day | ORAL | Status: DC
  Administered 2020-07-07 – 2020-07-09 (×2): 5 mg via ORAL
  Filled 2020-07-07 (×3): qty 1

## 2020-07-07 NOTE — ED Notes (Signed)
Pt continues to sleep,. Will hold synthroid until pt awake.

## 2020-07-07 NOTE — Progress Notes (Signed)
PROGRESS NOTE    Arthur Romero  IRC:[id number] DOB: [birth year] DOA: 07/06/2020 PCP: Olin Hauser, DO   Brief Narrative: HPI per Dr. Lorella Nimrod on 07/06/20 Arthur Romero is a 79 y.o. male with medical history significant of hypertension, persistent atrial fibrillation, rheumatoid arthritis, thyroid disease and GERD presented to ED with complaint of bleeding per rectum. Patient saw some blood initially during wiping after using the toilet yesterday, bleeding increased to the point that he can see blood in the toilet bowl.  He was complaining of mild discomfort in the rectal area.  Denies any fever or chills.  He was also complaining of some constipation for which he took some MiraLAX and Colace and does not think that he had a good response.  He had multiple episodes of small bowel movements since morning each with small amount of blood.  His appetite is okay.  No other symptoms.  Denies any upper respiratory symptoms or sick contacts.  He is fully vaccinated.  No nausea, vomiting.  No chest pain or shortness of breath.  No urinary symptoms.  Patient was taking Xarelto for A. fib along with baby aspirin.  Never had any similar episodes before.  ED Course: Hemodynamically stable.  COVID-19 negative, labs with hemoglobin of 14 on arrival and decreased to 13 when repeated after few hours.  leukocytosis at 13.9, CT abdomen with some nodular changes in distal rectum and upper anal canal which can be either due to acute proctitis and anal inflammation.  Low rectal neoplasm/malignancy cannot be ruled out. [facility] was asked admission to keep him under observation. GI was also consulted.  **Interim History Patient states that he had 5 bloody bowel movements the day before he came in and one yesterday.  Xarelto has been held.  No nausea or vomiting.  Patient was hungry.  Awaiting GI potassium.  Assessment & Plan:   Active Problems:   Lower GI bleed  BRBPR/Lower GI Bleed.    Normocytic Anemia  -Drop in hemoglobin of 1 unit on admission -CT Abdomen and Pelvis w/ Contrast showed "Nodular changes of the distal rectum and upper anal canal with some suggestion of inflammation about the perineum. Findings may be due to acute proctitis and anal inflammation. A low rectal neoplasm/malignancy is also considered. Correlation with digital rectal examination may be helpful. Follow-up with direct visualization is suggested. 2. Signs of Nissen fundoplication partially herniated into the  chest. Mildly patulous appearance of the distal esophagus. Correlate with any upper abdominal symptoms. 3. 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 4. Small cystic focus in the neck of the pancreas unchanged since May 18, 2020 approximately 1.4 cm with an adjacent smaller, subcentimeter focus in the pancreas. Follow-up with MRI/MRCP in 1 year is suggested. 5. Aortic atherosclerosis. 6. Signs of ventral abdominal wall reconstruction with mesh. Small umbilical hernia at just below the margin of the  reconstructive changes not changed from previous exam." -Now Hb/Hct has gone from 14.0/41.1 -> 13.1/38.4 -> 12.8/37.8; MCV was 94.5  -No change in BUN or creatinine.  Last dose of Xarelto was 07/05/2020. -GI was consulted-will appreciate their recommendations and still awaiting to see the patient  -Follow-up with GI recommendations. -Hold Xarelto and ASA -Continue to Monitor Hgb/Hct q6h  Hypertension.  -Blood pressure mildly elevated. -Continue home dose of Amlodipine 10 mg po Daily and Diltiazem 180 mg po Daily.  Persistent Atrial Fibrillation -Rate well controlled and appears in sinus rhythm. -Continue home dose of Diltiazem 180 mg po  Daily . -Currently holding Xarelto due to rectal bleed.  Mixed Hyperlipidemia. -Continue Atrovastatin 40 mg po Daily  -Holding home dose of Aspirin.  Acquired Hypothyroidism. -Continue Levothyroixine 88 mg po Daily   Pancreatic  Cyst -Small cystic focus in the neck of the pancreas unchanged since May 18, 2020 approximately 1.4 cm with an adjacent smaller, subcentimeter focus in the pancreas.  -Follow-up with MRI/MRCP in 1 year is suggested as an outpatient   GERD -C/w Pantoprazole 40 mg po Daily   Mononeuropathy -C/w Gabapentin 200 mg po Dialy   Gout -C/w Allopurinol 300 mg po Daily   Leukocytosis -Likely Reactive in the setting of Above -Patient's WBC went from 11.1 -> 13.9 and is now 13.4; WBC on 8/31 was 12.7 -Continue to Monitor for S/Sx of Infection; No source of Infection identified -Repeat CBC in the AM   DVT prophylaxis: SCDs Code Status: FULL CODE  Family Communication: FULL CODE Disposition Plan: (specify when and where you expect patient to be discharged). Include barriers to DC in this tab.  Status is: Observation  The patient will require care spanning > 2 midnights and should be moved to inpatient because: Ongoing diagnostic testing needed not appropriate for outpatient work up, Unsafe d/c plan and Inpatient level of care appropriate due to severity of illness  Dispo: The patient is from: Home              Anticipated d/c is to: TBD              Anticipated d/c date is: 1-2 days              Patient currently is not medically stable to d/c.  Consultants:   Gastroenterology Dr. Bonna Gains    Procedures:   Antimicrobials:  Anti-infectives (From admission, onward)   Start     Dose/Rate Route Frequency Ordered Stop   07/06/20 1545  amoxicillin-clavulanate (AUGMENTIN) 875-125 MG per tablet 1 tablet        1 tablet Oral  Once 07/06/20 1534 07/06/20 1602        Subjective: Seen and examined at bedside and he denies any chest pain, lightheadedness or dizziness.  Got concerned when he started having multiple bloody bowel movements and states that it filled with toilet.  Had a bloody bowel movement yesterday.  Patient is hungry currently.  No other concerns or points at this  time.  Objective: Vitals:   07/06/20 1600 07/06/20 1919 07/06/20 2316 07/07/20 0925  BP: (!) 147/85 (!) 145/78 123/76 133/76  Pulse: 79 81 80 76  Resp: 16  16   Temp:  97.6 F (36.4 C) 98 F (36.7 C) 98.7 F (37.1 C)  TempSrc:  Axillary Oral Oral  SpO2: 97% 98% 97% 98%  Weight:      Height:        Intake/Output Summary (Last 24 hours) at 07/07/2020 1130 Last data filed at 07/07/2020 0358 Gross per 24 hour  Intake --  Output 450 ml  Net -450 ml   Filed Weights   07/06/20 0802  Weight: 79.4 kg   Examination: Physical Exam:  Constitutional: WN/WD elderly Caucasian male currently in NAD and appears calm and comfortable Eyes: Lids and conjunctivae normal, sclerae anicteric  ENMT: External Ears, Nose appear normal. Grossly normal hearing. .  Neck: Appears normal, supple, no cervical masses, normal ROM, no appreciable thyromegaly; no JVD Respiratory: Diminished to auscultation bilaterally, no wheezing, rales, rhonchi or crackles. Normal respiratory effort and patient is not tachypenic. No accessory  muscle use.  Unlabored breathing Cardiovascular: Irregularly irregular, no murmurs / rubs / gallops. S1 and S2 auscultated. No extremity edema.  Abdomen: Soft, non-tender, non-distended. Bowel sounds positive.  GU: Deferred. Musculoskeletal: No clubbing / cyanosis of digits/nails. No joint deformity upper and lower extremities.  Skin: No rashes, lesions, ulcers on limited skin evaluation. No induration; Warm and dry.  Neurologic: CN 2-12 grossly intact with no focal deficits. Romberg sign and cerebellar reflexes not assessed.  Psychiatric: Normal judgment and insight. Alert and oriented x 3. Normal mood and appropriate affect.   Data Reviewed: I have personally reviewed following labs and imaging studies  CBC: Recent Labs  Lab 07/06/20 0806 07/06/20 1504 07/07/20 0348  WBC 11.1* 13.9* 13.4*  NEUTROABS  --  11.2*  --   HGB 14.0 13.1 12.8*  HCT 41.1 38.4* 37.8*  MCV 93.8 93.4  94.5  PLT 271 260 242   Basic Metabolic Panel: Recent Labs  Lab 07/06/20 0806 07/07/20 0348  NA 139 141  K 3.9 4.0  CL 101 105  CO2 28 28  GLUCOSE 107* 83  BUN 12 13  CREATININE 0.85 0.84  CALCIUM 9.2 9.0   GFR: Estimated Creatinine Clearance: 84 mL/min (by C-G formula based on SCr of 0.84 mg/dL). Liver Function Tests: Recent Labs  Lab 07/06/20 0806  AST 17  ALT 18  ALKPHOS 99  BILITOT 0.7  PROT 7.6  ALBUMIN 4.1   No results for input(s): LIPASE, AMYLASE in the last 168 hours. No results for input(s): AMMONIA in the last 168 hours. Coagulation Profile: No results for input(s): INR, PROTIME in the last 168 hours. Cardiac Enzymes: No results for input(s): CKTOTAL, CKMB, CKMBINDEX, TROPONINI in the last 168 hours. BNP (last 3 results) No results for input(s): PROBNP in the last 8760 hours. HbA1C: No results for input(s): HGBA1C in the last 72 hours. CBG: No results for input(s): GLUCAP in the last 168 hours. Lipid Profile: No results for input(s): CHOL, HDL, LDLCALC, TRIG, CHOLHDL, LDLDIRECT in the last 72 hours. Thyroid Function Tests: No results for input(s): TSH, T4TOTAL, FREET4, T3FREE, THYROIDAB in the last 72 hours. Anemia Panel: No results for input(s): VITAMINB12, FOLATE, FERRITIN, TIBC, IRON, RETICCTPCT in the last 72 hours. Sepsis Labs: No results for input(s): PROCALCITON, LATICACIDVEN in the last 168 hours.  Recent Results (from the past 240 hour(s))  SARS Coronavirus 2 by RT PCR (hospital order, performed in [facility] hospital lab) Nasopharyngeal Nasopharyngeal Swab     Status: None   Collection Time: 07/06/20  1:37 PM   Specimen: Nasopharyngeal Swab  Result Value Ref Range Status   SARS Coronavirus 2 NEGATIVE NEGATIVE Final    Comment: (NOTE) SARS-CoV-2 target nucleic acids are NOT DETECTED.  The SARS-CoV-2 RNA is generally detectable in upper and lower respiratory specimens during the acute phase of infection. The lowest concentration of  SARS-CoV-2 viral copies this assay can detect is 250 copies / mL. A negative result does not preclude SARS-CoV-2 infection and should not be used as the sole basis for treatment or other patient management decisions.  A negative result may occur with improper specimen collection / handling, submission of specimen other than nasopharyngeal swab, presence of viral mutation(s) within the areas targeted by this assay, and inadequate number of viral copies (<250 copies / mL). A negative result must be combined with clinical observations, patient history, and epidemiological information.  Fact Sheet for Patients:   StrictlyIdeas.no  Fact Sheet for Healthcare Providers: BankingDealers.co.za  This test is not yet  approved or  cleared by the [place] and has been authorized for detection and/or diagnosis of SARS-CoV-2 by FDA under an Emergency Use Authorization (EUA).  This EUA will remain in effect (meaning this test can be used) for the duration of the COVID-19 declaration under Section 564(b)(1) of the Act, 21 U.S.C. section 360bbb-3(b)(1), unless the authorization is terminated or revoked sooner.  Performed at [facility], [address] [place], [place] [address]      RN Pressure Injury Documentation:     Estimated body mass index is 21.87 kg/m as calculated from the following:   Height as of this encounter: 6\' 3"  (1.905 m).   Weight as of this encounter: 79.4 kg.  Malnutrition Type:      Malnutrition Characteristics:      Nutrition Interventions:    Radiology Studies: CT ABDOMEN PELVIS W CONTRAST  Result Date: 07/06/2020 CLINICAL DATA:  Diverticulitis suspected EXAM: CT ABDOMEN AND PELVIS WITH CONTRAST TECHNIQUE: Multidetector CT imaging of the abdomen and pelvis was performed using the standard protocol following bolus administration of intravenous contrast. CONTRAST:  15mL OMNIPAQUE IOHEXOL 300  MG/ML  SOLN COMPARISON:  Abdomen and pelvis of May 18, 2020 FINDINGS: Lower chest: Incidental imaging of the lung bases is unremarkable. No pericardial effusion. Heart is incompletely imaged. Hepatobiliary: Liver without focal, suspicious hepatic lesion. No pericholecystic stranding. No biliary duct dilation. Pancreas: Pancreas with small cystic focus in the neck of the pancreas unchanged since previous study May 18, 2020 approximately 1.4 cm with an adjacent smaller, subcentimeter focus in the pancreas. No current peripancreatic stranding. Spleen: Spleen normal in size and contour with splenic calcification. Adrenals/Urinary Tract: Adrenal glands are normal. RIGHT renal cyst, moderately large unchanged. No hydronephrosis. No nephrolithiasis. No visible ureteral calculi. 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 Stomach/Bowel: Signs of Nissen fundoplication partially herniated into the chest. Mildly patulous appearance of the distal esophagus. No acute small bowel process. The appendix is normal. Colon largely stool filled. Fullness of the sphincter complex with mild stranding in the perianal area. No fluid collection. Thickening along the upper margin of the sphincter complex in the distal rectum with nodular features. This is just above the pelvic floor and extending into the sphincter complex. Vascular/Lymphatic: Calcified and noncalcified atheromatous plaque of the abdominal aorta. There is no gastrohepatic or hepatoduodenal ligament lymphadenopathy. No retroperitoneal or mesenteric lymphadenopathy. No pelvic sidewall lymphadenopathy. Reproductive: Unremarkable CT appearance of the prostate. Other: Signs of ventral abdominal wall reconstruction with mesh. Small umbilical hernia at just below the margin of the reconstructive changes not changed from previous exam. Musculoskeletal: Spinal degenerative changes. Osteopenia. No acute musculoskeletal process. IMPRESSION: 1. Nodular changes of  the distal rectum and upper anal canal with some suggestion of inflammation about the perineum. Findings may be due to acute proctitis and anal inflammation. A low rectal neoplasm/malignancy is also considered. Correlation with digital rectal examination may be helpful. Follow-up with direct visualization is suggested. 2. Signs of Nissen fundoplication partially herniated into the chest. Mildly patulous appearance of the distal esophagus. Correlate with any upper abdominal symptoms. 3. 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 4. Small cystic focus in the neck of the pancreas unchanged since May 18, 2020 approximately 1.4 cm with an adjacent smaller, subcentimeter focus in the pancreas. Follow-up with MRI/MRCP in 1 year is suggested. 5. Aortic atherosclerosis. 6. Signs of ventral abdominal wall reconstruction with mesh. Small umbilical hernia at just below the margin of the reconstructive changes not  changed from previous exam. Aortic Atherosclerosis (ICD10-I70.0). Electronically Signed   By: Zetta Bills M.D.   On: 07/06/2020 14:34   Scheduled Meds: . allopurinol  300 mg Oral Daily  . amLODipine  10 mg Oral Daily  . atorvastatin  40 mg Oral Daily  . diltiazem  180 mg Oral Daily  . gabapentin  200 mg Oral QHS  . levothyroxine  88 mcg Oral Daily  . magnesium hydroxide  30 mL Oral Once  . pantoprazole  40 mg Oral Daily  . sodium chloride flush  3 mL Intravenous Q12H   Continuous Infusions:   LOS: 0 days   Kerney Elbe, DO [facility] PAGER is on AMION  If 7PM-7AM, please contact night-coverage [email or web address]

## 2020-07-07 NOTE — Progress Notes (Signed)
Pt has been stable, no BM was noted today, last BM was 07/05/20 per pt. Pt for colonoscopy/EGD on 07/09/20.

## 2020-07-07 NOTE — Consult Note (Signed)
Vonda Antigua, MD [address] [place], [place], [place], [address] [address], [place], [place], [place], [address] Phone: [phone]  Fax: [phone]  Consultation  Referring Provider:     Dr. Reesa Chew Primary Care Physician:  Olin Hauser, DO Reason for Consultation:     Rectal bleeding  Date of Admission:  07/06/2020 Date of Consultation:  07/07/2020         HPI:   Arthur Romero is a 79 y.o. male with history of A. fib on Xarelto at home presents with 1 day history of bright red blood per rectum.  Denies any previous history of GI bleed.  Denies abdominal pain or emesis.  Denies any dysphagia.  Last episode of bright red blood was 2 days ago.  Last Xarelto use was on Friday.  Patient has history of Nissen fundoplication and describes esophageal dilation due to a tight wrap in the past, but denies any dysphagia currently.  These previous records are not available to me.  He describes history of colonoscopy in 2012 that was normal.  Denies any family history of colon cancer.  Colonoscopy records not available.  CT on admission shows nodular changes of the distal rectum and upper anal canal with some suggestion of inflammation about the perineum "findings may be due to acute proctitis and anal inflammation.  A low rectal neoplasm/malignancy is also considered."  Past Medical History:  Diagnosis Date  . Arthritis   . GERD (gastroesophageal reflux disease)   . Hypertension   . Rheumatoid arthritis ([place])   . Thyroid disease     Past Surgical History:  Procedure Laterality Date  . CARDIAC SURGERY    . SHOULDER SURGERY Right 2016  . TYMPANOPLASTY Right 1987  . vocal cords  1989   polyps    Prior to Admission medications   Medication Sig Start Date End Date Taking? Authorizing Provider  acetaminophen (TYLENOL) 500 MG tablet Take 500-1,000 mg by mouth every 6 (six) hours as needed for mild pain or fever.    Yes [provider]  allopurinol  (ZYLOPRIM) 300 MG tablet Take 300 mg by mouth daily.   Yes [provider]  amLODipine (NORVASC) 10 MG tablet Take 10 mg by mouth daily.   Yes [provider]  aspirin 81 MG chewable tablet Chew 81 mg by mouth daily.   Yes [provider]  atorvastatin (LIPITOR) 40 MG tablet Take 1 tablet (40 mg total) by mouth daily. Patient taking differently: Take 40 mg by mouth every evening.  04/18/20  Yes Karamalegos, Devonne Doughty, DO  diltiazem (CARDIZEM CD) 180 MG 24 hr capsule Take 1 capsule (180 mg total) by mouth daily. 06/26/20  Yes Karamalegos, Devonne Doughty, DO  gabapentin (NEURONTIN) 100 MG capsule Start 1 capsule daily at bed time, increase by 1 cap every 2-3 days as tolerated up to take 3 at once in evening. Patient taking differently: Take 200 mg by mouth at bedtime.  04/18/20  Yes Karamalegos, Devonne Doughty, DO  levothyroxine (SYNTHROID) 88 MCG tablet Take 1 tablet (88 mcg total) by mouth daily. 03/27/20  Yes Karamalegos, Alexander J, DO  melatonin 3 MG TABS tablet Take 3 mg by mouth at bedtime as needed (sleep).   Yes [provider]  omeprazole (PRILOSEC) 20 MG capsule Take 1 capsule (20 mg total) by mouth daily. 04/18/20  Yes Karamalegos, Devonne Doughty, DO  Rivaroxaban (XARELTO) 20 MG TABS tablet Take 1 tablet (20 mg total) by mouth daily with supper.  06/26/20  Yes Karamalegos, Devonne Doughty, DO    Family History  Problem Relation Age of Onset  . Hypertension Mother   . Osteoarthritis Mother   . Parkinson's disease Mother   . Heart attack Father 63  . Tuberculosis Father   . Stroke Brother   . Diabetes Brother      Social History   Tobacco Use  . Smoking status: Never Smoker  . Smokeless tobacco: Never Used  Vaping Use  . Vaping Use: Never used  Substance Use Topics  . Alcohol use: Never    Alcohol/week: 1.0 standard drink    Types: 1 Glasses of wine per week  . Drug use: Never    Allergies as of 07/06/2020 - Review Complete 07/06/2020  Allergen Reaction  Noted  . Ace inhibitors Swelling 05/12/2017  . Prednisone Hives 06/05/2015    Review of Systems:    All systems reviewed and negative except where noted in HPI.   Physical Exam:  Vital signs in last 24 hours: Vitals:   07/06/20 1600 07/06/20 1919 07/06/20 2316 07/07/20 0925  BP: (!) 147/85 (!) 145/78 123/76 133/76  Pulse: 79 81 80 76  Resp: 16  16   Temp:  97.6 F (36.4 C) 98 F (36.7 C) 98.7 F (37.1 C)  TempSrc:  Axillary Oral Oral  SpO2: 97% 98% 97% 98%  Weight:      Height:       Last BM Date: 07/06/20 General:   Pleasant, cooperative in NAD Head:  Normocephalic and atraumatic. Eyes:   No icterus.   Conjunctiva pink. PERRLA. Ears:  Normal auditory acuity. Neck:  Supple; no masses or thyroidomegaly Lungs: Respirations even and unlabored. Lungs clear to auscultation bilaterally.   No wheezes, crackles, or rhonchi.  Abdomen:  Soft, nondistended, nontender. Normal bowel sounds. No appreciable masses or hepatomegaly.  No rebound or guarding.  Neurologic:  Alert and oriented x3;  grossly normal neurologically. Skin:  Intact without significant lesions or rashes. Cervical Nodes:  No significant cervical adenopathy. Psych:  Alert and cooperative. Normal affect.  LAB RESULTS: Recent Labs    07/06/20 0806 07/06/20 1504 07/07/20 0348  WBC 11.1* 13.9* 13.4*  HGB 14.0 13.1 12.8*  HCT 41.1 38.4* 37.8*  PLT 271 260 279   BMET Recent Labs    07/06/20 0806 07/07/20 0348  NA 139 141  K 3.9 4.0  CL 101 105  CO2 28 28  GLUCOSE 107* 83  BUN 12 13  CREATININE 0.85 0.84  CALCIUM 9.2 9.0   LFT Recent Labs    07/06/20 0806  PROT 7.6  ALBUMIN 4.1  AST 17  ALT 18  ALKPHOS 99  BILITOT 0.7   PT/INR No results for input(s): LABPROT, INR in the last 72 hours.  STUDIES: CT ABDOMEN PELVIS W CONTRAST  Result Date: 07/06/2020 CLINICAL DATA:  Diverticulitis suspected EXAM: CT ABDOMEN AND PELVIS WITH CONTRAST TECHNIQUE: Multidetector CT imaging of the abdomen and pelvis  was performed using the standard protocol following bolus administration of intravenous contrast. CONTRAST:  173mL OMNIPAQUE IOHEXOL 300 MG/ML  SOLN COMPARISON:  Abdomen and pelvis of May 18, 2020 FINDINGS: Lower chest: Incidental imaging of the lung bases is unremarkable. No pericardial effusion. Heart is incompletely imaged. Hepatobiliary: Liver without focal, suspicious hepatic lesion. No pericholecystic stranding. No biliary duct dilation. Pancreas: Pancreas with small cystic focus in the neck of the pancreas unchanged since previous study May 18, 2020 approximately 1.4 cm with an adjacent smaller, subcentimeter focus in the pancreas. No  current peripancreatic stranding. Spleen: Spleen normal in size and contour with splenic calcification. Adrenals/Urinary Tract: Adrenal glands are normal. RIGHT renal cyst, moderately large unchanged. No hydronephrosis. No nephrolithiasis. No visible ureteral calculi. 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 Stomach/Bowel: Signs of Nissen fundoplication partially herniated into the chest. Mildly patulous appearance of the distal esophagus. No acute small bowel process. The appendix is normal. Colon largely stool filled. Fullness of the sphincter complex with mild stranding in the perianal area. No fluid collection. Thickening along the upper margin of the sphincter complex in the distal rectum with nodular features. This is just above the pelvic floor and extending into the sphincter complex. Vascular/Lymphatic: Calcified and noncalcified atheromatous plaque of the abdominal aorta. There is no gastrohepatic or hepatoduodenal ligament lymphadenopathy. No retroperitoneal or mesenteric lymphadenopathy. No pelvic sidewall lymphadenopathy. Reproductive: Unremarkable CT appearance of the prostate. Other: Signs of ventral abdominal wall reconstruction with mesh. Small umbilical hernia at just below the margin of the reconstructive changes not changed from  previous exam. Musculoskeletal: Spinal degenerative changes. Osteopenia. No acute musculoskeletal process. IMPRESSION: 1. Nodular changes of the distal rectum and upper anal canal with some suggestion of inflammation about the perineum. Findings may be due to acute proctitis and anal inflammation. A low rectal neoplasm/malignancy is also considered. Correlation with digital rectal examination may be helpful. Follow-up with direct visualization is suggested. 2. Signs of Nissen fundoplication partially herniated into the chest. Mildly patulous appearance of the distal esophagus. Correlate with any upper abdominal symptoms. 3. 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 4. Small cystic focus in the neck of the pancreas unchanged since May 18, 2020 approximately 1.4 cm with an adjacent smaller, subcentimeter focus in the pancreas. Follow-up with MRI/MRCP in 1 year is suggested. 5. Aortic atherosclerosis. 6. Signs of ventral abdominal wall reconstruction with mesh. Small umbilical hernia at just below the margin of the reconstructive changes not changed from previous exam. Aortic Atherosclerosis (ICD10-I70.0). Electronically Signed   By: Zetta Bills M.D.   On: 07/06/2020 14:34      Impression / Plan:   Arthur Romero is a 79 y.o. y/o male with rectal bleeding on Xarelto at home  Xarelto will need to be held for 2 days prior to colonoscopy  Patient ate solid food today for breakfast and lunch and therefore starting colonoscopy prep today would not be ideal to allow for optimal prep  Plan on clear liquid diet all day tomorrow, with colonoscopy prep and colonoscopy the subsequent day with Dr. Vicente Males  Continue serial CBCs and transfuse as needed.  Patient currently hemodynamically stable and hemoglobin stable  Finding of patulous appearance of distal esophagus is reported on CT scan.  Patient is denying any dysphagia, but given this finding and patient's need for ongoing  anticoagulation, it would be useful to rule out any lesions in this area.  EGD can be done at the time of his colonoscopy  Would recommend surgery consult given report of partially herniated Nissen fundoplication reported  I have discussed alternative options, risks & benefits,  which include, but are not limited to, bleeding, infection, perforation,respiratory complication & drug reaction.  The patient agrees with this plan & written consent will be obtained.      Thank you for involving me in the care of this patient.      LOS: 0 days   Virgel Manifold, MD  07/07/2020, 3:05 PM

## 2020-07-08 DIAGNOSIS — K449 Diaphragmatic hernia without obstruction or gangrene: Secondary | ICD-10-CM | POA: Diagnosis not present

## 2020-07-08 DIAGNOSIS — K625 Hemorrhage of anus and rectum: Secondary | ICD-10-CM | POA: Diagnosis not present

## 2020-07-08 DIAGNOSIS — M1A9XX0 Chronic gout, unspecified, without tophus (tophi): Secondary | ICD-10-CM | POA: Diagnosis not present

## 2020-07-08 DIAGNOSIS — G589 Mononeuropathy, unspecified: Secondary | ICD-10-CM | POA: Diagnosis present

## 2020-07-08 DIAGNOSIS — I4821 Permanent atrial fibrillation: Secondary | ICD-10-CM | POA: Diagnosis not present

## 2020-07-08 DIAGNOSIS — D72829 Elevated white blood cell count, unspecified: Secondary | ICD-10-CM | POA: Diagnosis not present

## 2020-07-08 DIAGNOSIS — K862 Cyst of pancreas: Secondary | ICD-10-CM | POA: Diagnosis present

## 2020-07-08 DIAGNOSIS — I4819 Other persistent atrial fibrillation: Secondary | ICD-10-CM | POA: Diagnosis present

## 2020-07-08 DIAGNOSIS — Z831 Family history of other infectious and parasitic diseases: Secondary | ICD-10-CM | POA: Diagnosis not present

## 2020-07-08 DIAGNOSIS — Z7901 Long term (current) use of anticoagulants: Secondary | ICD-10-CM | POA: Diagnosis not present

## 2020-07-08 DIAGNOSIS — K5900 Constipation, unspecified: Secondary | ICD-10-CM | POA: Diagnosis present

## 2020-07-08 DIAGNOSIS — Z20822 Contact with and (suspected) exposure to covid-19: Secondary | ICD-10-CM | POA: Diagnosis present

## 2020-07-08 DIAGNOSIS — Z7982 Long term (current) use of aspirin: Secondary | ICD-10-CM | POA: Diagnosis not present

## 2020-07-08 DIAGNOSIS — I10 Essential (primary) hypertension: Secondary | ICD-10-CM | POA: Diagnosis not present

## 2020-07-08 DIAGNOSIS — M109 Gout, unspecified: Secondary | ICD-10-CM | POA: Diagnosis present

## 2020-07-08 DIAGNOSIS — M069 Rheumatoid arthritis, unspecified: Secondary | ICD-10-CM | POA: Diagnosis present

## 2020-07-08 DIAGNOSIS — Z833 Family history of diabetes mellitus: Secondary | ICD-10-CM | POA: Diagnosis not present

## 2020-07-08 DIAGNOSIS — E782 Mixed hyperlipidemia: Secondary | ICD-10-CM | POA: Diagnosis not present

## 2020-07-08 DIAGNOSIS — Z820 Family history of epilepsy and other diseases of the nervous system: Secondary | ICD-10-CM | POA: Diagnosis not present

## 2020-07-08 DIAGNOSIS — R9389 Abnormal findings on diagnostic imaging of other specified body structures: Secondary | ICD-10-CM | POA: Diagnosis not present

## 2020-07-08 DIAGNOSIS — K922 Gastrointestinal hemorrhage, unspecified: Secondary | ICD-10-CM | POA: Diagnosis not present

## 2020-07-08 DIAGNOSIS — Z8249 Family history of ischemic heart disease and other diseases of the circulatory system: Secondary | ICD-10-CM | POA: Diagnosis not present

## 2020-07-08 DIAGNOSIS — D649 Anemia, unspecified: Secondary | ICD-10-CM | POA: Diagnosis not present

## 2020-07-08 DIAGNOSIS — K219 Gastro-esophageal reflux disease without esophagitis: Secondary | ICD-10-CM | POA: Diagnosis present

## 2020-07-08 DIAGNOSIS — Z888 Allergy status to other drugs, medicaments and biological substances status: Secondary | ICD-10-CM | POA: Diagnosis not present

## 2020-07-08 DIAGNOSIS — R933 Abnormal findings on diagnostic imaging of other parts of digestive tract: Secondary | ICD-10-CM | POA: Diagnosis not present

## 2020-07-08 DIAGNOSIS — K6289 Other specified diseases of anus and rectum: Secondary | ICD-10-CM | POA: Diagnosis present

## 2020-07-08 DIAGNOSIS — M1990 Unspecified osteoarthritis, unspecified site: Secondary | ICD-10-CM | POA: Diagnosis present

## 2020-07-08 DIAGNOSIS — E079 Disorder of thyroid, unspecified: Secondary | ICD-10-CM | POA: Diagnosis present

## 2020-07-08 DIAGNOSIS — I480 Paroxysmal atrial fibrillation: Secondary | ICD-10-CM | POA: Diagnosis not present

## 2020-07-08 DIAGNOSIS — Z823 Family history of stroke: Secondary | ICD-10-CM | POA: Diagnosis not present

## 2020-07-08 DIAGNOSIS — E039 Hypothyroidism, unspecified: Secondary | ICD-10-CM | POA: Diagnosis not present

## 2020-07-08 DIAGNOSIS — K640 First degree hemorrhoids: Secondary | ICD-10-CM | POA: Diagnosis present

## 2020-07-08 DIAGNOSIS — K649 Unspecified hemorrhoids: Secondary | ICD-10-CM | POA: Diagnosis not present

## 2020-07-08 LAB — CBC WITH DIFFERENTIAL/PLATELET
Abs Immature Granulocytes: 0.07 10*3/uL (ref 0.00–0.07)
Basophils Absolute: 0.1 10*3/uL (ref 0.0–0.1)
Basophils Relative: 1 %
Eosinophils Absolute: 0.4 10*3/uL (ref 0.0–0.5)
Eosinophils Relative: 3 %
HCT: 38.5 % — ABNORMAL LOW (ref 39.0–52.0)
Hemoglobin: 13.5 g/dL (ref 13.0–17.0)
Immature Granulocytes: 1 %
Lymphocytes Relative: 16 %
Lymphs Abs: 2 10*3/uL (ref 0.7–4.0)
MCH: 31.7 pg (ref 26.0–34.0)
MCHC: 35.1 g/dL (ref 30.0–36.0)
MCV: 90.4 fL (ref 80.0–100.0)
Monocytes Absolute: 1 10*3/uL (ref 0.1–1.0)
Monocytes Relative: 8 %
Neutro Abs: 9.1 10*3/uL — ABNORMAL HIGH (ref 1.7–7.7)
Neutrophils Relative %: 71 %
Platelets: 297 10*3/uL (ref 150–400)
RBC: 4.26 MIL/uL (ref 4.22–5.81)
RDW: 14.4 % (ref 11.5–15.5)
WBC: 12.6 10*3/uL — ABNORMAL HIGH (ref 4.0–10.5)
nRBC: 0 % (ref 0.0–0.2)

## 2020-07-08 LAB — COMPREHENSIVE METABOLIC PANEL
ALT: 16 U/L (ref 0–44)
AST: 14 U/L — ABNORMAL LOW (ref 15–41)
Albumin: 3.6 g/dL (ref 3.5–5.0)
Alkaline Phosphatase: 91 U/L (ref 38–126)
Anion gap: 12 (ref 5–15)
BUN: 12 mg/dL (ref 8–23)
CO2: 26 mmol/L (ref 22–32)
Calcium: 8.9 mg/dL (ref 8.9–10.3)
Chloride: 102 mmol/L (ref 98–111)
Creatinine, Ser: 0.83 mg/dL (ref 0.61–1.24)
GFR calc Af Amer: >60 mL/min (ref >60)
GFR calc non Af Amer: >60 mL/min (ref >60)
Glucose, Bld: 97 mg/dL (ref 70–99)
Potassium: 3.7 mmol/L (ref 3.5–5.1)
Sodium: 140 mmol/L (ref 135–145)
Total Bilirubin: 0.7 mg/dL (ref 0.3–1.2)
Total Protein: 6.8 g/dL (ref 6.5–8.1)

## 2020-07-08 LAB — GLUCOSE, CAPILLARY: Glucose-Capillary: 93 mg/dL (ref 70–99)

## 2020-07-08 LAB — PHOSPHORUS: Phosphorus: 4.1 mg/dL (ref 2.5–4.6)

## 2020-07-08 LAB — MAGNESIUM: Magnesium: 2 mg/dL (ref 1.7–2.4)

## 2020-07-08 NOTE — Consult Note (Addendum)
[facility] SURGICAL CONSULTATION NOTE (initial) - cpt: 62130   HISTORY OF PRESENT ILLNESS (HPI):  79 y.o. male presented to [facility] ED on 09/11 for evaluation of rectal bleeding. Patient reported in the 48 hours prior to presenting to the ED he had noticed an increase in blood per rectum with bowel movements. Initially, this was in small amounts but progressed to bright red blood which filled the toilet bowel. He denied any history of the same prior to this. He was on Xarelto for atrial fibrillation but he stopped this on 09/10. He believes he may have been constipated the last few days and was taking Senokot without significant improvement. Otherwise, he denied any fever, chills, cough, congestion, CP, SOB, abdominal pina, nausea, emesis, weight loss, or urinary changes. Unsure of last colonoscopy. No FHx of colon cancer. Previous abdominal surgeries include remote nissen fundoplication and ventral hernia repair with mesh. He is unsure when exactly these procedures were but they were done at [facility]. Only notes in [facility] reveal "Hernia repair in 1987." Work up in the ED revealed normal hgb at 14.0 (trended 14.0 --> 13.1 --> 12.8 --> 13.5), mild leukocytosis to 11K, CMP was reassuring aside from mild hyperglycemia to 107. CT Abdomen/Pelvis was concerning for thickening of the rectal mucosa of uncertain etiology. Additionally, on CT he was found to have small herniation of previous hiatal hernia repair. He denied any reflux symptoms. He did note a history of requiring EGD balloon dilation after initial hiatal hernia repair because "it was wrapped too tight." Again, he is uncertain of when this occurred. He was admitted to medicine service with GI consultation. Plan for EGD and colonoscopy tomorrow 09/14.   Surgery is consulted by hospitalist physician Dr. Alfredia Ferguson, DO in this context for evaluation and management of herniation of previous hiatal hernia repair.  PAST MEDICAL HISTORY (PMH):  Past  Medical History:  Diagnosis Date  . Arthritis   . GERD (gastroesophageal reflux disease)   . Hypertension   . Rheumatoid arthritis ([place])   . Thyroid disease      PAST SURGICAL HISTORY ([place]):  Past Surgical History:  Procedure Laterality Date  . CARDIAC SURGERY    . SHOULDER SURGERY Right 2016  . TYMPANOPLASTY Right 1987  . vocal cords  1989   polyps     MEDICATIONS:  Prior to Admission medications   Medication Sig Start Date End Date Taking? Authorizing Provider  acetaminophen (TYLENOL) 500 MG tablet Take 500-1,000 mg by mouth every 6 (six) hours as needed for mild pain or fever.    Yes [provider]  allopurinol (ZYLOPRIM) 300 MG tablet Take 300 mg by mouth daily.   Yes [provider]  amLODipine (NORVASC) 10 MG tablet Take 10 mg by mouth daily.   Yes [provider]  aspirin 81 MG chewable tablet Chew 81 mg by mouth daily.   Yes [provider]  atorvastatin (LIPITOR) 40 MG tablet Take 1 tablet (40 mg total) by mouth daily. Patient taking differently: Take 40 mg by mouth every evening.  04/18/20  Yes Karamalegos, Devonne Doughty, DO  diltiazem (CARDIZEM CD) 180 MG 24 hr capsule Take 1 capsule (180 mg total) by mouth daily. 06/26/20  Yes Karamalegos, Devonne Doughty, DO  gabapentin (NEURONTIN) 100 MG capsule Start 1 capsule daily at bed time, increase by 1 cap every 2-3 days as tolerated up to take 3 at once in evening. Patient taking differently: Take 200 mg by mouth at bedtime.  04/18/20  Yes Karamalegos,  Devonne Doughty, DO  levothyroxine (SYNTHROID) 88 MCG tablet Take 1 tablet (88 mcg total) by mouth daily. 03/27/20  Yes Karamalegos, Alexander J, DO  melatonin 3 MG TABS tablet Take 3 mg by mouth at bedtime as needed (sleep).   Yes [provider]  omeprazole (PRILOSEC) 20 MG capsule Take 1 capsule (20 mg total) by mouth daily. 04/18/20  Yes Karamalegos, Devonne Doughty, DO  Rivaroxaban (XARELTO) 20 MG TABS tablet Take 1 tablet (20 mg total) by mouth  daily with supper. 06/26/20  Yes Karamalegos, Devonne Doughty, DO     ALLERGIES:  Allergies  Allergen Reactions  . Ace Inhibitors Swelling    Lip swelling Lip swelling Lip swelling   . Prednisone Hives     SOCIAL HISTORY:  Social History   Socioeconomic History  . Marital status: Single    Spouse name: Not on file  . Number of children: Not on file  . Years of education: high school  . Highest education level: High school graduate  Occupational History  . Not on file  Tobacco Use  . Smoking status: Never Smoker  . Smokeless tobacco: Never Used  Vaping Use  . Vaping Use: Never used  Substance and Sexual Activity  . Alcohol use: Never    Alcohol/week: 1.0 standard drink    Types: 1 Glasses of wine per week  . Drug use: Never  . Sexual activity: Not on file  Other Topics Concern  . Not on file  Social History Narrative  . Not on file   Social Determinants of Health   Financial Resource Strain:   . Difficulty of Paying Living Expenses: Not on file  Food Insecurity:   . Worried About Charity fundraiser in the Last Year: Not on file  . Ran Out of Food in the Last Year: Not on file  Transportation Needs:   . Lack of Transportation (Medical): Not on file  . Lack of Transportation (Non-Medical): Not on file  Physical Activity:   . Days of Exercise per Week: Not on file  . Minutes of Exercise per Session: Not on file  Stress:   . Feeling of Stress : Not on file  Social Connections:   . Frequency of Communication with Friends and Family: Not on file  . Frequency of Social Gatherings with Friends and Family: Not on file  . Attends Religious Services: Not on file  . Active Member of Clubs or Organizations: Not on file  . Attends Archivist Meetings: Not on file  . Marital Status: Not on file  Intimate Partner Violence:   . Fear of Current or Ex-Partner: Not on file  . Emotionally Abused: Not on file  . Physically Abused: Not on file  . Sexually Abused: Not  on file     FAMILY HISTORY:  Family History  Problem Relation Age of Onset  . Hypertension Mother   . Osteoarthritis Mother   . Parkinson's disease Mother   . Heart attack Father 36  . Tuberculosis Father   . Stroke Brother   . Diabetes Brother       REVIEW OF SYSTEMS:  Review of Systems  Constitutional: Negative for chills and fever.  HENT: Negative for congestion and sore throat.   Respiratory: Negative for cough and shortness of breath.   Cardiovascular: Negative for chest pain and palpitations.  Gastrointestinal: Positive for blood in stool and constipation. Negative for abdominal pain, diarrhea, heartburn, nausea and vomiting.  Genitourinary: Negative for dysuria and urgency.  Neurological: Negative for dizziness and headaches.  All other systems reviewed and are negative.   VITAL SIGNS:  Temp:  [98.4 F (36.9 C)-98.7 F (37.1 C)] 98.4 F (36.9 C) (09/13 0806) Pulse Rate:  [76-78] 78 (09/13 0806) Resp:  [16] 16 (09/13 0806) BP: (124-133)/(73-90) 132/90 (09/13 0806) SpO2:  [98 %-100 %] 100 % (09/13 0806)     Height: 6\' 3"  (190.5 cm) Weight: 79.4 kg BMI (Calculated): 21.87   INTAKE/OUTPUT:  09/12 0701 - 09/13 0700 In: -  Out: 1150 [Urine:1150]  PHYSICAL EXAM:  Physical Exam Vitals and nursing note reviewed.  Constitutional:      General: He is not in acute distress.    Appearance: Normal appearance. He is normal weight. He is not ill-appearing.  HENT:     Head: Normocephalic and atraumatic.  Eyes:     General: No scleral icterus.    Conjunctiva/sclera: Conjunctivae normal.  Cardiovascular:     Rate and Rhythm: Normal rate and regular rhythm.     Pulses: Normal pulses.     Heart sounds: No murmur heard.   Pulmonary:     Effort: Pulmonary effort is normal. No respiratory distress.     Breath sounds: Normal breath sounds. No wheezing.  Abdominal:     General: Abdomen is flat. A surgical scar is present. There is no distension.     Tenderness: There is  no abdominal tenderness. There is no guarding or rebound.     Hernia: A hernia is present. Hernia is present in the umbilical area.     Comments: Abdomen is soft, non-tender, non-distended, no rebound/guarding. He has a small reducible umbilical hernia. Previous midline surgical scars appreciable  Musculoskeletal:     Right lower leg: No edema.     Left lower leg: No edema.  Skin:    General: Skin is warm and dry.     Coloration: Skin is not pale.     Findings: No erythema.  Neurological:     General: No focal deficit present.     Mental Status: He is alert. He is disoriented.  Psychiatric:        Mood and Affect: Mood normal.        Behavior: Behavior normal.      Labs:  CBC Latest Ref Rng & Units 07/08/2020 07/07/2020 07/06/2020  WBC 4.0 - 10.5 K/uL 12.6(H) 13.4(H) 13.9(H)  Hemoglobin 13.0 - 17.0 g/dL 13.5 12.8(L) 13.1  Hematocrit 39 - 52 % 38.5(L) 37.8(L) 38.4(L)  Platelets 150 - 400 K/uL 297 279 260   CMP Latest Ref Rng & Units 07/08/2020 07/07/2020 07/06/2020  Glucose 70 - 99 mg/dL 97 83 107(H)  BUN 8 - 23 mg/dL 12 13 12   Creatinine 0.61 - 1.24 mg/dL 0.83 0.84 0.85  Sodium 135 - 145 mmol/L 140 141 139  Potassium 3.5 - 5.1 mmol/L 3.7 4.0 3.9  Chloride 98 - 111 mmol/L 102 105 101  CO2 22 - 32 mmol/L 26 28 28   Calcium 8.9 - 10.3 mg/dL 8.9 9.0 9.2  Total Protein 6.5 - 8.1 g/dL 6.8 - 7.6  Total Bilirubin 0.3 - 1.2 mg/dL 0.7 - 0.7  Alkaline Phos 38 - 126 U/L 91 - 99  AST 15 - 41 U/L 14(L) - 17  ALT 0 - 44 U/L 16 - 18     Imaging studies:   CT Abdomen/Pelvis (07/06/2020) personally reviewed which shows area of rectal mucosal thickening, previous ventral hernia repair with mesh, and small herniation of previous nissen fundoplication repair which  appears stable vs slightly improved compared to previous imaging dating back to 2017 in our system. Radiologist report reviewed below as well:  IMPRESSION: 1. Nodular changes of the distal rectum and upper anal canal with some  suggestion of inflammation about the perineum. Findings may be due to acute proctitis and anal inflammation. A low rectal neoplasm/malignancy is also considered. Correlation with digital rectal examination may be helpful. Follow-up with direct visualization is suggested. 2. Signs of Nissen fundoplication partially herniated into the chest. Mildly patulous appearance of the distal esophagus. Correlate with any upper abdominal symptoms. 3. 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 4. Small cystic focus in the neck of the pancreas unchanged since May 18, 2020 approximately 1.4 cm with an adjacent smaller, subcentimeter focus in the pancreas. Follow-up with MRI/MRCP in 1 year is suggested. 5. Aortic atherosclerosis. 6. Signs of ventral abdominal wall reconstruction with mesh. Small umbilical hernia at just below the margin of the reconstructive changes not changed from previous exam.   Assessment/Plan: (ICD-10's: K44.9) 79 y.o. male with stable hemoglobin admitted with bright red blood per rectum x48 hours and rectal mucosal thickening on CT of unclear etiology pending colonoscopy on 09/14 otherwise incidentally found to have small hiatal hernia s/p remote Nissen which appears stable on imaging reviewed back to 2017 and is asymptomatic.    - In regards to his small hiatal hernia s/p remote repair, he is asymptomatic and this appears present on imaging dating back to 2017 in our system. I do not think he warrants any intervention at this time nor in the future.    - Appreciate GI consultation; plan for EGD + Colonoscopy   - Monitor H&H; stable; transfuse as needed  - Monitor abdominal examination  - Further management per primary service   - No acute surgical issues at this time; we will follow peripherally for now and follow up colonoscopy results tomorrow    All of the above findings and recommendations were discussed with the patient, and all of patient's questions  were answered to his expressed satisfaction.  Thank you for the opportunity to participate in this patient's care.   -- Edison Simon, PA-C [facility] 07/08/2020, 8:09 AM [phone] M-F: 7am - 4pm

## 2020-07-08 NOTE — Progress Notes (Signed)
GI note  Hb stable- on schedule for EGD+colonoscopy tomorrow  Dr Jonathon Bellows MD,MRCP [facility]) Gastroenterology/Hepatology Pager: [phone]

## 2020-07-08 NOTE — Plan of Care (Signed)
No signs of blood in stool this shift.  Problem: Education: Goal: Knowledge of General Education information will improve Description: Including pain rating scale, medication(s)/side effects and non-pharmacologic comfort measures Outcome: Progressing   Problem: Health Behavior/Discharge Planning: Goal: Ability to manage health-related needs will improve Outcome: Progressing   Problem: Clinical Measurements: Goal: Ability to maintain clinical measurements within normal limits will improve Outcome: Progressing Goal: Will remain free from infection Outcome: Progressing Goal: Diagnostic test results will improve Outcome: Progressing Goal: Respiratory complications will improve Outcome: Progressing Goal: Cardiovascular complication will be avoided Outcome: Progressing   Problem: Activity: Goal: Risk for activity intolerance will decrease Outcome: Progressing   Problem: Nutrition: Goal: Adequate nutrition will be maintained Outcome: Progressing   Problem: Coping: Goal: Level of anxiety will decrease Outcome: Progressing   Problem: Elimination: Goal: Will not experience complications related to bowel motility Outcome: Progressing Goal: Will not experience complications related to urinary retention Outcome: Progressing   Problem: Pain Managment: Goal: General experience of comfort will improve Outcome: Progressing   Problem: Safety: Goal: Ability to remain free from injury will improve Outcome: Progressing   Problem: Skin Integrity: Goal: Risk for impaired skin integrity will decrease Outcome: Progressing

## 2020-07-08 NOTE — Progress Notes (Signed)
PROGRESS NOTE    Arthur Romero  QHU:[id number] DOB: [birth year] DOA: 07/06/2020 PCP: Olin Hauser, DO   Brief Narrative: HPI per Dr. Lorella Nimrod on 07/06/20 Arthur Romero is a 79 y.o. male with medical history significant of hypertension, persistent atrial fibrillation, rheumatoid arthritis, thyroid disease and GERD presented to ED with complaint of bleeding per rectum. Patient saw some blood initially during wiping after using the toilet yesterday, bleeding increased to the point that he can see blood in the toilet bowl.  He was complaining of mild discomfort in the rectal area.  Denies any fever or chills.  He was also complaining of some constipation for which he took some MiraLAX and Colace and does not think that he had a good response.  He had multiple episodes of small bowel movements since morning each with small amount of blood.  His appetite is okay.  No other symptoms.  Denies any upper respiratory symptoms or sick contacts.  He is fully vaccinated.  No nausea, vomiting.  No chest pain or shortness of breath.  No urinary symptoms.  Patient was taking Xarelto for A. fib along with baby aspirin.  Never had any similar episodes before.  ED Course: Hemodynamically stable.  COVID-19 negative, labs with hemoglobin of 14 on arrival and decreased to 13 when repeated after few hours.  leukocytosis at 13.9, CT abdomen with some nodular changes in distal rectum and upper anal canal which can be either due to acute proctitis and anal inflammation.  Low rectal neoplasm/malignancy cannot be ruled out. [facility] was asked admission to keep him under observation. GI was also consulted.  **Interim History Patient states that he had 5 bloody bowel movements initially and then had one the day before yesterday.  Xarelto has been held.  No nausea or vomiting.  Patient was hungry.  Awaiting GI evaluation and they have put the patient on a CLD and are going to do a Colonoscopy prep today and  colonoscopy tomororrow as his Xarelto would need to be held for 2 days prior to Colonoscopy. Because he also had a patulous appearance of the distal esophagus, EGD can be done a the time of colonoscopy and Dr. Bonna Gains recommends Surgical consult given the report of partially herniated Nissen fundoplication.  Surgery has evaluated and feels like there is no surgical intervention necessary at this time.  Assessment & Plan:   Active Problems:   Lower GI bleed  BRBPR/Lower GI Bleed.   Normocytic Anemia  -Drop in hemoglobin of 1 unit on admission -CT Abdomen and Pelvis w/ Contrast showed "Nodular changes of the distal rectum and upper anal canal with some suggestion of inflammation about the perineum. Findings may be due to acute proctitis and anal inflammation. A low rectal neoplasm/malignancy is also considered. Correlation with digital rectal examination may be helpful. Follow-up with direct visualization is suggested. 2. Signs of Nissen fundoplication partially herniated into the  chest. Mildly patulous appearance of the distal esophagus. Correlate with any upper abdominal symptoms. 3. 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 4. Small cystic focus in the neck of the pancreas unchanged since May 18, 2020 approximately 1.4 cm with an adjacent smaller, subcentimeter focus in the pancreas. Follow-up with MRI/MRCP in 1 year is suggested. 5. Aortic atherosclerosis. 6. Signs of ventral abdominal wall reconstruction with mesh. Small umbilical hernia at just below the margin of the  reconstructive changes not changed from previous exam." -Now Hb/Hct has gone from 14.0/41.1 -> 13.1/38.4 -> 12.8/37.8 and  today is 13.5/38.5; MCV was 90.4 -No change in BUN or creatinine.  Last dose of Xarelto was 07/05/2020. -GI was consulted and recommending CLD diet today and Colonoscopy tomorrow along with an EGD -Follow-up with GI recommendations. -Hold Xarelto and ASA -Continue to Monitor Hgb/Hct  q6h  Hypertension.  -Blood pressure mildly elevated initially but now improved -Continue home dose of Amlodipine 10 mg po Daily and Diltiazem 180 mg po Daily. -BP was 132/90  Persistent Atrial Fibrillation -Rate well controlled and appears in sinus rhythm. -Continue home dose of Diltiazem 180 mg po Daily . -Currently holding Xarelto due to rectal bleed.  Partially herniated Nissen Fundoplication -GI recommending Surgical Consultation -Will consult General Surgery given GI recommendations -General surgery evaluated and patient is asymptomatic and does not want any intervention at this time or in the future per the surgery team but they are going to follow peripherally for now and follow-up on colonoscopy results tomorrow  Mixed Hyperlipidemia. -Continue Atrovastatin 40 mg po Daily  -Holding home dose of Aspirin for now   Acquired Hypothyroidism. -Continue Levothyroixine 88 mg po Daily   Pancreatic Cyst -Small cystic focus in the neck of the pancreas unchanged since May 18, 2020 approximately 1.4 cm with an adjacent smaller, subcentimeter focus in the pancreas.  -Follow-up with MRI/MRCP in 1 year is suggested as an outpatient   GERD -C/w Pantoprazole 40 mg po Daily   Mononeuropathy -C/w Gabapentin 200 mg po Dialy   Gout -C/w Allopurinol 300 mg po Daily   Leukocytosis -Likely Reactive in the setting of Above -Patient's WBC went from 11.1 -> 13.9 and is now 13.4 and trending down further to 12.6; WBC on 8/31 was 12.7 -Continue to Monitor for S/Sx of Infection; No source of Infection identified -Repeat CBC in the AM   DVT prophylaxis: SCDs for now given his rectal bleeding and will hold his home anticoagulation Xarelto Code Status: FULL CODE  Family Communication: FULL CODE Disposition Plan: (specify when and where you expect patient to be discharged). Include barriers to DC in this tab.  Status is: Inpatient  Remains inpatient appropriate because:Unsafe d/c plan,  IV treatments appropriate due to intensity of illness or inability to take PO and Inpatient level of care appropriate due to severity of illness   Dispo: The patient is from: Home              Anticipated d/c is to: Home              Anticipated d/c date is: 2 days              Patient currently is not medically stable to d/c.   Consultants:   Gastroenterology Dr. Bonna Gains   General Surgery Dr. Christian Mate   Procedures:  EGD/Colonoscopy to be done in the AM   Antimicrobials:  Anti-infectives (From admission, onward)   Start     Dose/Rate Route Frequency Ordered Stop   07/06/20 1545  amoxicillin-clavulanate (AUGMENTIN) 875-125 MG per tablet 1 tablet        1 tablet Oral  Once 07/06/20 1534 07/06/20 1602        Subjective: Seen and examined at bedside and he states that his stomach was vomiting.  States he did not sleep very well last night because he is nervous.  No nausea or vomiting.  Denies any lightheadedness or dizziness.  No other concerns or complaints at this time.  Objective: Vitals:   07/06/20 2316 07/07/20 0925 07/07/20 1527 07/08/20 0006  BP: 123/76 133/76  125/73 124/84  Pulse: 80 76 77 76  Resp: 16   16  Temp: 98 F (36.7 C) 98.7 F (37.1 C) 98.7 F (37.1 C) 98.4 F (36.9 C)  TempSrc: Oral Oral Oral Oral  SpO2: 97% 98% 98% 98%  Weight:      Height:        Intake/Output Summary (Last 24 hours) at 07/08/2020 0751 Last data filed at 07/08/2020 0600 Gross per 24 hour  Intake --  Output 1150 ml  Net -1150 ml   Filed Weights   07/06/20 0802  Weight: 79.4 kg   Examination: Physical Exam:  Constitutional: WN/WD elderly Caucasian male currently in no acute distress appears anxious but does appear comfortable. Eyes: Lids and conjunctivae normal, sclerae anicteric  ENMT: External Ears, Nose appear normal. Grossly normal hearing.  Neck: Appears normal, supple, no cervical masses, normal ROM, no appreciable thyromegaly; no JVD Respiratory: Diminished to  auscultation bilaterally with coarse breath sounds, no wheezing, rales, rhonchi or crackles. Normal respiratory effort and patient is not tachypenic. No accessory muscle use.  Unlabored breathing Cardiovascular: Irregularly irregular but not tachycardic, no murmurs / rubs / gallops. S1 and S2 auscultated. No appreciable lower extremity edema Abdomen: Soft, non-tender, non-distended. Bowel sounds positive x4.  GU: Deferred. Musculoskeletal: No clubbing / cyanosis of digits/nails. No joint deformity upper and lower extremities. Skin: No rashes, lesions, ulcers on limited skin evaluation. No induration; Warm and dry.  Neurologic: CN 2-12 grossly intact with no focal deficits. Romberg sign and cerebellar reflexes not assessed.  Psychiatric: Normal judgment and insight. Alert and oriented x 3.  Mildly anxious mood and appropriate affect.   Data Reviewed: I have personally reviewed following labs and imaging studies  CBC: Recent Labs  Lab 07/06/20 0806 07/06/20 1504 07/07/20 0348 07/08/20 0248  WBC 11.1* 13.9* 13.4* 12.6*  NEUTROABS  --  11.2*  --  9.1*  HGB 14.0 13.1 12.8* 13.5  HCT 41.1 38.4* 37.8* 38.5*  MCV 93.8 93.4 94.5 90.4  PLT 271 260 279 572   Basic Metabolic Panel: Recent Labs  Lab 07/06/20 0806 07/07/20 0348 07/08/20 0248  NA 139 141 140  K 3.9 4.0 3.7  CL 101 105 102  CO2 28 28 26   GLUCOSE 107* 83 97  BUN 12 13 12   CREATININE 0.85 0.84 0.83  CALCIUM 9.2 9.0 8.9  MG  --   --  2.0  PHOS  --   --  4.1   GFR: Estimated Creatinine Clearance: 85 mL/min (by C-G formula based on SCr of 0.83 mg/dL). Liver Function Tests: Recent Labs  Lab 07/06/20 0806 07/08/20 0248  AST 17 14*  ALT 18 16  ALKPHOS 99 91  BILITOT 0.7 0.7  PROT 7.6 6.8  ALBUMIN 4.1 3.6   No results for input(s): LIPASE, AMYLASE in the last 168 hours. No results for input(s): AMMONIA in the last 168 hours. Coagulation Profile: No results for input(s): INR, PROTIME in the last 168 hours. Cardiac  Enzymes: No results for input(s): CKTOTAL, CKMB, CKMBINDEX, TROPONINI in the last 168 hours. BNP (last 3 results) No results for input(s): PROBNP in the last 8760 hours. HbA1C: No results for input(s): HGBA1C in the last 72 hours. CBG: No results for input(s): GLUCAP in the last 168 hours. Lipid Profile: No results for input(s): CHOL, HDL, LDLCALC, TRIG, CHOLHDL, LDLDIRECT in the last 72 hours. Thyroid Function Tests: No results for input(s): TSH, T4TOTAL, FREET4, T3FREE, THYROIDAB in the last 72 hours. Anemia Panel: No results for input(s):  VITAMINB12, FOLATE, FERRITIN, TIBC, IRON, RETICCTPCT in the last 72 hours. Sepsis Labs: No results for input(s): PROCALCITON, LATICACIDVEN in the last 168 hours.  Recent Results (from the past 240 hour(s))  SARS Coronavirus 2 by RT PCR (hospital order, performed in [facility] hospital lab) Nasopharyngeal Nasopharyngeal Swab     Status: None   Collection Time: 07/06/20  1:37 PM   Specimen: Nasopharyngeal Swab  Result Value Ref Range Status   SARS Coronavirus 2 NEGATIVE NEGATIVE Final    Comment: (NOTE) SARS-CoV-2 target nucleic acids are NOT DETECTED.  The SARS-CoV-2 RNA is generally detectable in upper and lower respiratory specimens during the acute phase of infection. The lowest concentration of SARS-CoV-2 viral copies this assay can detect is 250 copies / mL. A negative result does not preclude SARS-CoV-2 infection and should not be used as the sole basis for treatment or other patient management decisions.  A negative result may occur with improper specimen collection / handling, submission of specimen other than nasopharyngeal swab, presence of viral mutation(s) within the areas targeted by this assay, and inadequate number of viral copies (<250 copies / mL). A negative result must be combined with clinical observations, patient history, and epidemiological information.  Fact Sheet for Patients:    StrictlyIdeas.no  Fact Sheet for Healthcare Providers: BankingDealers.co.za  This test is not yet approved or  cleared by the [place] FDA and has been authorized for detection and/or diagnosis of SARS-CoV-2 by FDA under an Emergency Use Authorization (EUA).  This EUA will remain in effect (meaning this test can be used) for the duration of the COVID-19 declaration under Section 564(b)(1) of the Act, 21 U.S.C. section 360bbb-3(b)(1), unless the authorization is terminated or revoked sooner.  Performed at [facility], [address] [place], [place] [address]      RN Pressure Injury Documentation:     Estimated body mass index is 21.87 kg/m as calculated from the following:   Height as of this encounter: 6\' 3"  (1.905 m).   Weight as of this encounter: 79.4 kg.  Malnutrition Type:      Malnutrition Characteristics:      Nutrition Interventions:    Radiology Studies: CT ABDOMEN PELVIS W CONTRAST  Result Date: 07/06/2020 CLINICAL DATA:  Diverticulitis suspected EXAM: CT ABDOMEN AND PELVIS WITH CONTRAST TECHNIQUE: Multidetector CT imaging of the abdomen and pelvis was performed using the standard protocol following bolus administration of intravenous contrast. CONTRAST:  181mL OMNIPAQUE IOHEXOL 300 MG/ML  SOLN COMPARISON:  Abdomen and pelvis of May 18, 2020 FINDINGS: Lower chest: Incidental imaging of the lung bases is unremarkable. No pericardial effusion. Heart is incompletely imaged. Hepatobiliary: Liver without focal, suspicious hepatic lesion. No pericholecystic stranding. No biliary duct dilation. Pancreas: Pancreas with small cystic focus in the neck of the pancreas unchanged since previous study May 18, 2020 approximately 1.4 cm with an adjacent smaller, subcentimeter focus in the pancreas. No current peripancreatic stranding. Spleen: Spleen normal in size and contour with splenic calcification.  Adrenals/Urinary Tract: Adrenal glands are normal. RIGHT renal cyst, moderately large unchanged. No hydronephrosis. No nephrolithiasis. No visible ureteral calculi. 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 Stomach/Bowel: Signs of Nissen fundoplication partially herniated into the chest. Mildly patulous appearance of the distal esophagus. No acute small bowel process. The appendix is normal. Colon largely stool filled. Fullness of the sphincter complex with mild stranding in the perianal area. No fluid collection. Thickening along the upper margin of the sphincter complex in the distal rectum  with nodular features. This is just above the pelvic floor and extending into the sphincter complex. Vascular/Lymphatic: Calcified and noncalcified atheromatous plaque of the abdominal aorta. There is no gastrohepatic or hepatoduodenal ligament lymphadenopathy. No retroperitoneal or mesenteric lymphadenopathy. No pelvic sidewall lymphadenopathy. Reproductive: Unremarkable CT appearance of the prostate. Other: Signs of ventral abdominal wall reconstruction with mesh. Small umbilical hernia at just below the margin of the reconstructive changes not changed from previous exam. Musculoskeletal: Spinal degenerative changes. Osteopenia. No acute musculoskeletal process. IMPRESSION: 1. Nodular changes of the distal rectum and upper anal canal with some suggestion of inflammation about the perineum. Findings may be due to acute proctitis and anal inflammation. A low rectal neoplasm/malignancy is also considered. Correlation with digital rectal examination may be helpful. Follow-up with direct visualization is suggested. 2. Signs of Nissen fundoplication partially herniated into the chest. Mildly patulous appearance of the distal esophagus. Correlate with any upper abdominal symptoms. 3. 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 4. Small cystic focus in the neck of the pancreas unchanged since  May 18, 2020 approximately 1.4 cm with an adjacent smaller, subcentimeter focus in the pancreas. Follow-up with MRI/MRCP in 1 year is suggested. 5. Aortic atherosclerosis. 6. Signs of ventral abdominal wall reconstruction with mesh. Small umbilical hernia at just below the margin of the reconstructive changes not changed from previous exam. Aortic Atherosclerosis (ICD10-I70.0). Electronically Signed   By: Zetta Bills M.D.   On: 07/06/2020 14:34   Scheduled Meds: . allopurinol  300 mg Oral Daily  . amLODipine  10 mg Oral Daily  . atorvastatin  40 mg Oral Daily  . diltiazem  180 mg Oral Daily  . gabapentin  200 mg Oral QHS  . levothyroxine  88 mcg Oral Daily  . magnesium hydroxide  30 mL Oral Once  . melatonin  5 mg Oral QHS  . pantoprazole  40 mg Oral Daily  . polyethylene glycol-electrolytes  4,000 mL Oral Once  . sodium chloride flush  3 mL Intravenous Q12H   Continuous Infusions:   LOS: 0 days   Kerney Elbe, DO [facility] PAGER is on AMION  If 7PM-7AM, please contact night-coverage [email or web address]

## 2020-07-09 ENCOUNTER — Inpatient Hospital Stay: Payer: Medicare PPO | Admitting: Anesthesiology

## 2020-07-09 ENCOUNTER — Encounter
Admission: EM | Disposition: A | Discharge status: Home Health | Payer: Self-pay | Source: Home / Self Care | Attending: Internal Medicine

## 2020-07-09 ENCOUNTER — Other Ambulatory Visit: Payer: Self-pay | Admitting: Family Medicine

## 2020-07-09 ENCOUNTER — Encounter: Payer: Self-pay | Admitting: Internal Medicine

## 2020-07-09 DIAGNOSIS — K922 Gastrointestinal hemorrhage, unspecified: Secondary | ICD-10-CM

## 2020-07-09 DIAGNOSIS — R9389 Abnormal findings on diagnostic imaging of other specified body structures: Secondary | ICD-10-CM

## 2020-07-09 HISTORY — PX: COLONOSCOPY: SHX5424

## 2020-07-09 HISTORY — PX: ESOPHAGOGASTRODUODENOSCOPY: SHX5428

## 2020-07-09 LAB — CBC WITH DIFFERENTIAL/PLATELET
Abs Immature Granulocytes: 0.06 10*3/uL (ref 0.00–0.07)
Basophils Absolute: 0.1 10*3/uL (ref 0.0–0.1)
Basophils Relative: 0 %
Eosinophils Absolute: 0.4 10*3/uL (ref 0.0–0.5)
Eosinophils Relative: 4 %
HCT: 40.4 % (ref 39.0–52.0)
Hemoglobin: 13.8 g/dL (ref 13.0–17.0)
Immature Granulocytes: 1 %
Lymphocytes Relative: 14 %
Lymphs Abs: 1.6 10*3/uL (ref 0.7–4.0)
MCH: 31.9 pg (ref 26.0–34.0)
MCHC: 34.2 g/dL (ref 30.0–36.0)
MCV: 93.3 fL (ref 80.0–100.0)
Monocytes Absolute: 0.9 10*3/uL (ref 0.1–1.0)
Monocytes Relative: 7 %
Neutro Abs: 8.4 10*3/uL — ABNORMAL HIGH (ref 1.7–7.7)
Neutrophils Relative %: 74 %
Platelets: 308 10*3/uL (ref 150–400)
RBC: 4.33 MIL/uL (ref 4.22–5.81)
RDW: 14.2 % (ref 11.5–15.5)
WBC: 11.4 10*3/uL — ABNORMAL HIGH (ref 4.0–10.5)
nRBC: 0 % (ref 0.0–0.2)

## 2020-07-09 LAB — MAGNESIUM: Magnesium: 1.9 mg/dL (ref 1.7–2.4)

## 2020-07-09 LAB — COMPREHENSIVE METABOLIC PANEL
ALT: 15 U/L (ref 0–44)
AST: 15 U/L (ref 15–41)
Albumin: 3.8 g/dL (ref 3.5–5.0)
Alkaline Phosphatase: 88 U/L (ref 38–126)
Anion gap: 10 (ref 5–15)
BUN: 8 mg/dL (ref 8–23)
CO2: 26 mmol/L (ref 22–32)
Calcium: 9.1 mg/dL (ref 8.9–10.3)
Chloride: 105 mmol/L (ref 98–111)
Creatinine, Ser: 0.86 mg/dL (ref 0.61–1.24)
GFR calc Af Amer: >60 mL/min (ref >60)
GFR calc non Af Amer: >60 mL/min (ref >60)
Glucose, Bld: 95 mg/dL (ref 70–99)
Potassium: 3.9 mmol/L (ref 3.5–5.1)
Sodium: 141 mmol/L (ref 135–145)
Total Bilirubin: 0.7 mg/dL (ref 0.3–1.2)
Total Protein: 6.8 g/dL (ref 6.5–8.1)

## 2020-07-09 LAB — PHOSPHORUS: Phosphorus: 3.6 mg/dL (ref 2.5–4.6)

## 2020-07-09 LAB — GLUCOSE, CAPILLARY: Glucose-Capillary: 92 mg/dL (ref 70–99)

## 2020-07-09 SURGERY — EGD (ESOPHAGOGASTRODUODENOSCOPY)
Anesthesia: General

## 2020-07-09 MED ORDER — EPHEDRINE SULFATE 50 MG/ML IJ SOLN
INTRAMUSCULAR | Status: DC | PRN
  Administered 2020-07-09: 7.5 mg via INTRAVENOUS
  Administered 2020-07-09: 10 mg via INTRAVENOUS
  Administered 2020-07-09: 7.5 mg via INTRAVENOUS

## 2020-07-09 MED ORDER — PROPOFOL 500 MG/50ML IV EMUL
INTRAVENOUS | Status: DC | PRN
  Administered 2020-07-09: 125 ug/kg/min via INTRAVENOUS

## 2020-07-09 MED ORDER — PROPOFOL 10 MG/ML IV BOLUS
INTRAVENOUS | Status: DC | PRN
  Administered 2020-07-09: 80 mg via INTRAVENOUS

## 2020-07-09 MED ORDER — PHENYLEPHRINE HCL (PRESSORS) 10 MG/ML IV SOLN
INTRAVENOUS | Status: DC | PRN
  Administered 2020-07-09 (×4): 160 ug via INTRAVENOUS

## 2020-07-09 MED ORDER — LIDOCAINE HCL (CARDIAC) PF 100 MG/5ML IV SOSY
PREFILLED_SYRINGE | INTRAVENOUS | Status: DC | PRN
  Administered 2020-07-09: 100 mg via INTRAVENOUS

## 2020-07-09 MED ORDER — SODIUM CHLORIDE 0.9 % IV SOLN: INTRAVENOUS | Status: DC

## 2020-07-09 NOTE — H&P (Signed)
Jonathon Bellows, MD [address] [place], [place], [place], [address] [address], [place], [place], [place], [address] Phone: [phone]  Fax: [phone]  Primary Care Physician:  Olin Hauser, DO   Pre-Procedure History & Physical: HPI:  Arthur Romero is a 79 y.o. male is here for an endoscopy and colonoscopy    Past Medical History:  Diagnosis Date  . Arthritis   . GERD (gastroesophageal reflux disease)   . Hypertension   . Rheumatoid arthritis ([place])   . Thyroid disease     Past Surgical History:  Procedure Laterality Date  . CARDIAC SURGERY    . SHOULDER SURGERY Right 2016  . TYMPANOPLASTY Right 1987  . vocal cords  1989   polyps    Prior to Admission medications   Medication Sig Start Date End Date Taking? Authorizing Provider  acetaminophen (TYLENOL) 500 MG tablet Take 500-1,000 mg by mouth every 6 (six) hours as needed for mild pain or fever.    Yes [provider]  allopurinol (ZYLOPRIM) 300 MG tablet Take 300 mg by mouth daily.   Yes [provider]  amLODipine (NORVASC) 10 MG tablet Take 10 mg by mouth daily.   Yes [provider]  aspirin 81 MG chewable tablet Chew 81 mg by mouth daily.   Yes [provider]  atorvastatin (LIPITOR) 40 MG tablet Take 1 tablet (40 mg total) by mouth daily. Patient taking differently: Take 40 mg by mouth every evening.  04/18/20  Yes Karamalegos, Devonne Doughty, DO  diltiazem (CARDIZEM CD) 180 MG 24 hr capsule Take 1 capsule (180 mg total) by mouth daily. 06/26/20  Yes Karamalegos, Devonne Doughty, DO  gabapentin (NEURONTIN) 100 MG capsule Start 1 capsule daily at bed time, increase by 1 cap every 2-3 days as tolerated up to take 3 at once in evening. Patient taking differently: Take 200 mg by mouth at bedtime.  04/18/20  Yes Karamalegos, Devonne Doughty, DO  levothyroxine (SYNTHROID) 88 MCG tablet Take 1 tablet (88 mcg total) by mouth daily. 03/27/20  Yes Karamalegos, Alexander J, DO   melatonin 3 MG TABS tablet Take 3 mg by mouth at bedtime as needed (sleep).   Yes [provider]  omeprazole (PRILOSEC) 20 MG capsule Take 1 capsule (20 mg total) by mouth daily. 04/18/20  Yes Karamalegos, Devonne Doughty, DO  Rivaroxaban (XARELTO) 20 MG TABS tablet Take 1 tablet (20 mg total) by mouth daily with supper. 06/26/20  Yes Olin Hauser, DO    Allergies as of 07/06/2020 - Review Complete 07/06/2020  Allergen Reaction Noted  . Ace inhibitors Swelling 05/12/2017  . Prednisone Hives 06/05/2015    Family History  Problem Relation Age of Onset  . Hypertension Mother   . Osteoarthritis Mother   . Parkinson's disease Mother   . Heart attack Father 1  . Tuberculosis Father   . Stroke Brother   . Diabetes Brother     Social History   Socioeconomic History  . Marital status: Single    Spouse name: Not on file  . Number of children: Not on file  . Years of education: high school  . Highest education level: High school graduate  Occupational History  . Not on file  Tobacco Use  . Smoking status: Never Smoker  . Smokeless tobacco: Never Used  Vaping Use  . Vaping Use: Never used  Substance and Sexual Activity  . Alcohol use: Never    Alcohol/week: 1.0 standard drink  Types: 1 Glasses of wine per week  . Drug use: Never  . Sexual activity: Not on file  Other Topics Concern  . Not on file  Social History Narrative  . Not on file   Social Determinants of Health   Financial Resource Strain:   . Difficulty of Paying Living Expenses: Not on file  Food Insecurity:   . Worried About Charity fundraiser in the Last Year: Not on file  . Ran Out of Food in the Last Year: Not on file  Transportation Needs:   . Lack of Transportation (Medical): Not on file  . Lack of Transportation (Non-Medical): Not on file  Physical Activity:   . Days of Exercise per Week: Not on file  . Minutes of Exercise per Session: Not on file  Stress:   . Feeling of Stress :  Not on file  Social Connections:   . Frequency of Communication with Friends and Family: Not on file  . Frequency of Social Gatherings with Friends and Family: Not on file  . Attends Religious Services: Not on file  . Active Member of Clubs or Organizations: Not on file  . Attends Archivist Meetings: Not on file  . Marital Status: Not on file  Intimate Partner Violence:   . Fear of Current or Ex-Partner: Not on file  . Emotionally Abused: Not on file  . Physically Abused: Not on file  . Sexually Abused: Not on file    Review of Systems: See HPI, otherwise negative ROS  Physical Exam: BP (!) 122/91   Pulse 80   Temp (!) 96.4 F (35.8 C) (Temporal)   Resp 16   Ht 6\' 3"  (1.905 m)   Wt 79.4 kg   SpO2 97%   BMI 21.88 kg/m  General:   Alert,  pleasant and cooperative in NAD Head:  Normocephalic and atraumatic. Neck:  Supple; no masses or thyromegaly. Lungs:  Clear throughout to auscultation, normal respiratory effort.    Heart:  +S1, +S2, Regular rate and rhythm, No edema. Abdomen:  Soft, nontender and nondistended. Normal bowel sounds, without guarding, and without rebound.   Neurologic:  Alert and  oriented x4;  grossly normal neurologically.  Impression/Plan: Arthur Romero is here for an endoscopy and colonoscopy  to be performed for  evaluation of abnormal ct scan and rectal bleeding    Risks, benefits, limitations, and alternatives regarding endoscopy have been reviewed with the patient.  Questions have been answered.  All parties agreeable.   Jonathon Bellows, MD  07/09/2020, 2:09 PM

## 2020-07-09 NOTE — Op Note (Signed)
[facility] Patient Name: Trygg Mantz Procedure Date: 07/09/2020 2:07 PM MRN: [id number] Account #: [id number] Date of Birth: Apr 02, 1941 Admit Type: Inpatient Age: 79 Room: [facility] ENDO ROOM 4 Gender: Male Note Status: Finalized Procedure:             Colonoscopy Indications:           Rectal bleeding Providers:             Jonathon Bellows MD, MD Referring MD:          Olin Hauser (Referring MD) Medicines:             Monitored Anesthesia Care Complications:         No immediate complications. Procedure:             Pre-Anesthesia Assessment:                        - Prior to the procedure, a History and Physical was                         performed, and patient medications, allergies and                         sensitivities were reviewed. The patient's tolerance                         of previous anesthesia was reviewed.                        - The risks and benefits of the procedure and the                         sedation options and risks were discussed with the                         patient. All questions were answered and informed                         consent was obtained.                        - ASA Grade Assessment: III - A patient with severe                         systemic disease.                        After obtaining informed consent, the colonoscope was                         passed under direct vision. Throughout the procedure,                         the patient's blood pressure, pulse, and oxygen                         saturations were monitored continuously. The                         Colonoscope was introduced through the anus and  advanced to the the cecum, identified by the                         appendiceal orifice. The colonoscopy was performed                         with ease. The patient tolerated the procedure well.                         The quality of the bowel  preparation was adequate to                         identify polyps. Findings:      Non-bleeding internal hemorrhoids were found during retroflexion. The       hemorrhoids were large and Grade I (internal hemorrhoids that do not       prolapse).      The exam was otherwise without abnormality on direct and retroflexion       views. Impression:            - Non-bleeding internal hemorrhoids.                        - The examination was otherwise normal on direct and                         retroflexion views.                        - No specimens collected. Recommendation:        - Return patient to hospital ward for ongoing care.                        - Advance diet as tolerated.                        - Continue present medications. Procedure Code(s):     --- Professional ---                        [phone], Colonoscopy, flexible; diagnostic, including                         collection of specimen(s) by brushing or washing, when                         performed (separate procedure) Diagnosis Code(s):     --- Professional ---                        K64.0, First degree hemorrhoids CPT copyright 2019 American Medical Association. All rights reserved. The codes documented in this report are preliminary and upon coder review may  be revised to meet current compliance requirements. Jonathon Bellows, MD Jonathon Bellows MD, MD 07/09/2020 2:32:16 PM This report has been signed electronically. Number of Addenda: 0 Note Initiated On: 07/09/2020 2:07 PM Scope Withdrawal Time: 0 hours 8 minutes 36 seconds  Total Procedure Duration: 0 hours 11 minutes 57 seconds  Estimated Blood Loss:  Estimated blood loss: none.      [facility]

## 2020-07-09 NOTE — Telephone Encounter (Signed)
Tamsulosin was discontinued.  Will decline this request.  [name], DO [facility] 07/09/2020, 5:52 PM

## 2020-07-09 NOTE — Anesthesia Postprocedure Evaluation (Signed)
Anesthesia Post Note  Patient: Arthur Romero  Procedure(s) Performed: ESOPHAGOGASTRODUODENOSCOPY (EGD) (N/A ) COLONOSCOPY (N/A )  Patient location during evaluation: Endoscopy Anesthesia Type: General Level of consciousness: awake and alert and oriented Pain management: pain level controlled Vital Signs Assessment: post-procedure vital signs reviewed and stable Respiratory status: spontaneous breathing, nonlabored ventilation and respiratory function stable Cardiovascular status: blood pressure returned to baseline and stable Postop Assessment: no signs of nausea or vomiting Anesthetic complications: no   No complications documented.   Last Vitals:  Vitals:   07/09/20 1444 07/09/20 1454  BP: 102/85 115/73  Pulse: 79 89  Resp: (!) 23 20  Temp:    SpO2: 98% 97%    Last Pain:  Vitals:   07/09/20 1454  TempSrc:   PainSc: 0-No pain                 Collins Dimaria

## 2020-07-09 NOTE — Transfer of Care (Signed)
Immediate Anesthesia Transfer of Care Note  Patient: Arthur Romero  Procedure(s) Performed: ESOPHAGOGASTRODUODENOSCOPY (EGD) (N/A ) COLONOSCOPY (N/A )  Patient Location: PACU  Anesthesia Type:MAC  Level of Consciousness: awake, alert  and oriented  Airway & Oxygen Therapy: Patient Spontanous Breathing  Post-op Assessment: Report given to RN and Post -op Vital signs reviewed and stable  Post vital signs: stable  Last Vitals:  Vitals Value Taken Time  BP 100/55 07/09/20 1434  Temp 35.8 C 07/09/20 1434  Pulse 87 07/09/20 1437  Resp 17 07/09/20 1437  SpO2 100 % 07/09/20 1437  Vitals shown include unvalidated device data.  Last Pain:  Vitals:   07/09/20 1434  TempSrc: Temporal  PainSc: 0-No pain         Complications: No complications documented.

## 2020-07-09 NOTE — Progress Notes (Signed)
PROGRESS NOTE    Arthur Romero  KGM:[id number] DOB: 10/08/1941 DOA: 07/06/2020 PCP: Olin Hauser, DO   Brief Narrative: HPI per Dr. Lorella Nimrod on 07/06/20 Arthur Romero is a 79 y.o. male with medical history significant of hypertension, persistent atrial fibrillation, rheumatoid arthritis, thyroid disease and GERD presented to ED with complaint of bleeding per rectum. Patient saw some blood initially during wiping after using the toilet yesterday, bleeding increased to the point that he can see blood in the toilet bowl.  He was complaining of mild discomfort in the rectal area.  Denies any fever or chills.  He was also complaining of some constipation for which he took some MiraLAX and Colace and does not think that he had a good response.  He had multiple episodes of small bowel movements since morning each with small amount of blood.  His appetite is okay.  No other symptoms.  Denies any upper respiratory symptoms or sick contacts.  He is fully vaccinated.  No nausea, vomiting.  No chest pain or shortness of breath.  No urinary symptoms.  Patient was taking Xarelto for A. fib along with baby aspirin.  Never had any similar episodes before.  ED Course: Hemodynamically stable.  COVID-19 negative, labs with hemoglobin of 14 on arrival and decreased to 13 when repeated after few hours.  leukocytosis at 13.9, CT abdomen with some nodular changes in distal rectum and upper anal canal which can be either due to acute proctitis and anal inflammation.  Low rectal neoplasm/malignancy cannot be ruled out. [facility] was asked admission to keep him under observation. GI was also consulted.  **Interim History Patient states that he had 5 bloody bowel movements initially and then had one the day before yesterday.  Xarelto has been held.  No nausea or vomiting.  Patient was hungry.  Awaiting GI evaluation and they have put the patient on a CLD and are going to do a Colonoscopy prep today and  colonoscopy tomororrow as his Xarelto would need to be held for 2 days prior to Colonoscopy. Because he also had a patulous appearance of the distal esophagus, EGD can be done a the time of colonoscopy and Dr. Bonna Gains recommends Surgical consult given the report of partially herniated Nissen fundoplication.  Surgery has evaluated and feels like there is no surgical intervention necessary at this time.  07/09/20 Currently awaiting for EGD and Colonoscopy to be done. Patient feels hungry.   Assessment & Plan:   Active Problems:   Lower GI bleed   GI bleeding  BRBPR/Lower GI Bleed.   Normocytic Anemia  -Drop in hemoglobin of 1 unit on admission -CT Abdomen and Pelvis w/ Contrast showed "Nodular changes of the distal rectum and upper anal canal with some suggestion of inflammation about the perineum. Findings may be due to acute proctitis and anal inflammation. A low rectal neoplasm/malignancy is also considered. Correlation with digital rectal examination may be helpful. Follow-up with direct visualization is suggested. 2. Signs of Nissen fundoplication partially herniated into the  chest. Mildly patulous appearance of the distal esophagus. Correlate with any upper abdominal symptoms. 3. 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 4. Small cystic focus in the neck of the pancreas unchanged since May 18, 2020 approximately 1.4 cm with an adjacent smaller, subcentimeter focus in the pancreas. Follow-up with MRI/MRCP in 1 year is suggested. 5. Aortic atherosclerosis. 6. Signs of ventral abdominal wall reconstruction with mesh. Small umbilical hernia at just below the margin of the  reconstructive changes not changed from previous exam." -Now Hb/Hct has gone from 14.0/41.1 -> 13.1/38.4 -> 12.8/37.8 -> 13.5/38.5 -> 13.8/40.4 ; MCV was 93.3 -No change in BUN or creatinine.  Last dose of Xarelto was 07/05/2020. -GI was consulted and recommending CLD diet today and Colonoscopy tomorrow  along with an EGD -Follow-up with GI recommendations. -Hold Xarelto and ASA -Continue to Monitor Hgb/Hct q6h  Hypertension.  -Blood pressure mildly elevated initially but now improved -Continue home dose of Amlodipine 10 mg po Daily and Diltiazem 180 mg po Daily. -BP was 121/77  Persistent Atrial Fibrillation -Rate well controlled and appears in sinus rhythm. -Continue home dose of Diltiazem 180 mg po Daily . -Currently holding Xarelto due to rectal bleed and awaiting Colonoscopy/EGD today   Partially herniated Nissen Fundoplication -GI recommending Surgical Consultation -Will consult General Surgery given GI recommendations -General surgery evaluated and patient is asymptomatic and does not want any intervention at this time or in the future per the surgery team but they are going to follow peripherally for now and follow-up on colonoscopy results tomorrow  Mixed Hyperlipidemia. -Continue Atrovastatin 40 mg po Daily  -Holding home dose of Aspirin for now   Acquired Hypothyroidism. -Continue Levothyroixine 88 mg po Daily   Pancreatic Cyst -Small cystic focus in the neck of the pancreas unchanged since May 18, 2020 approximately 1.4 cm with an adjacent smaller, subcentimeter focus in the pancreas.  -Follow-up with MRI/MRCP in 1 year is suggested as an outpatient   GERD -C/w Pantoprazole 40 mg po Daily   Mononeuropathy -C/w Gabapentin 200 mg po Dialy   Gout -C/w Allopurinol 300 mg po Daily   Leukocytosis, improving  -Likely Reactive in the setting of Above -Patient's WBC went from 11.1 -> 13.9 and is now 13.4 and trending down further to 12.6 -> 11.4; WBC on 8/31 was 12.7 -Continue to Monitor for S/Sx of Infection; No source of Infection identified -Repeat CBC in the AM   DVT prophylaxis: SCDs for now given his rectal bleeding and will hold his home anticoagulation Xarelto Code Status: FULL CODE  Family Communication: No family present at bedside  Disposition  Plan: Pending EGD/Colonoscopy results and clearance by GI   Status is: Inpatient  Remains inpatient appropriate because:Unsafe d/c plan, IV treatments appropriate due to intensity of illness or inability to take PO and Inpatient level of care appropriate due to severity of illness   Dispo: The patient is from: Home              Anticipated d/c is to: Home              Anticipated d/c date is: 2 days              Patient currently is not medically stable to d/c.   Consultants:   Gastroenterology Dr. Bonna Gains   General Surgery Dr. Christian Mate   Procedures:  EGD/Colonoscopy to be done today and pending  Antimicrobials:  Anti-infectives (From admission, onward)   Start     Dose/Rate Route Frequency Ordered Stop   07/06/20 1545  amoxicillin-clavulanate (AUGMENTIN) 875-125 MG per tablet 1 tablet        1 tablet Oral  Once 07/06/20 1534 07/06/20 1602        Subjective: Seen and examined at bedside and he feels gaseous. No nausea or vomiting. Denies any CP or SOB. States he is hungry. No other concerns or complaints at this time.   Objective: Vitals:   07/08/20 1531 07/08/20 2028 07/09/20 3016  07/09/20 0758  BP: 122/78 131/83 121/80 121/77  Pulse: 78 72 68 76  Resp: 16 18 18 16   Temp: 98.1 F (36.7 C) 97.9 F (36.6 C) 98.2 F (36.8 C) 98.1 F (36.7 C)  TempSrc: Oral Oral Oral Oral  SpO2: 99% 100% 95% 100%  Weight:      Height:        Intake/Output Summary (Last 24 hours) at 07/09/2020 1117 Last data filed at 07/09/2020 5009 Gross per 24 hour  Intake 1383 ml  Output 2250 ml  Net -867 ml   Filed Weights   07/06/20 0802  Weight: 79.4 kg   Examination: Physical Exam:  Constitutional: WN/WD elderly Caucasian chronically ill appearing male in NAD and appears calm and comfortable Eyes: Lids and conjunctivae normal, sclerae anicteric  ENMT: External Ears, Nose appear normal. Grossly normal hearing.  Neck: Appears normal, supple, no cervical masses, normal ROM, no  appreciable thyromegaly, no JVD Respiratory: Diminished to auscultation bilaterally, no wheezing, rales, rhonchi or crackles. Normal respiratory effort and patient is not tachypenic. No accessory muscle use. Unlabored breathing  Cardiovascular: Irregularly Irregular, no murmurs / rubs / gallops. S1 and S2 auscultated. No extremity edema.  Abdomen: Soft, non-tender, Non-Distended. Bowel sounds positive.  GU: Deferred. Musculoskeletal: No clubbing / cyanosis of digits/nails. No joint deformity upper and lower extremities.   Skin: No rashes, lesions, ulcers on a limited skin evaluation. No induration; Warm and dry.  Neurologic: CN 2-12 grossly intact with no focal deficits. Romberg sign and cerebellar reflexes not assessed.  Psychiatric: Normal judgment and insight. Alert and oriented x 3. Normal mood and appropriate affect.   Data Reviewed: I have personally reviewed following labs and imaging studies  CBC: Recent Labs  Lab 07/06/20 0806 07/06/20 1504 07/07/20 0348 07/08/20 0248 07/09/20 0711  WBC 11.1* 13.9* 13.4* 12.6* 11.4*  NEUTROABS  --  11.2*  --  9.1* 8.4*  HGB 14.0 13.1 12.8* 13.5 13.8  HCT 41.1 38.4* 37.8* 38.5* 40.4  MCV 93.8 93.4 94.5 90.4 93.3  PLT 271 260 279 297 381   Basic Metabolic Panel: Recent Labs  Lab 07/06/20 0806 07/07/20 0348 07/08/20 0248 07/09/20 0711  NA 139 141 140 141  K 3.9 4.0 3.7 3.9  CL 101 105 102 105  CO2 28 28 26 26   GLUCOSE 107* 83 97 95  BUN 12 13 12 8   CREATININE 0.85 0.84 0.83 0.86  CALCIUM 9.2 9.0 8.9 9.1  MG  --   --  2.0 1.9  PHOS  --   --  4.1 3.6   GFR: Estimated Creatinine Clearance: 82.1 mL/min (by C-G formula based on SCr of 0.86 mg/dL). Liver Function Tests: Recent Labs  Lab 07/06/20 0806 07/08/20 0248 07/09/20 0711  AST 17 14* 15  ALT 18 16 15   ALKPHOS 99 91 88  BILITOT 0.7 0.7 0.7  PROT 7.6 6.8 6.8  ALBUMIN 4.1 3.6 3.8   No results for input(s): LIPASE, AMYLASE in the last 168 hours. No results for input(s):  AMMONIA in the last 168 hours. Coagulation Profile: No results for input(s): INR, PROTIME in the last 168 hours. Cardiac Enzymes: No results for input(s): CKTOTAL, CKMB, CKMBINDEX, TROPONINI in the last 168 hours. BNP (last 3 results) No results for input(s): PROBNP in the last 8760 hours. HbA1C: No results for input(s): HGBA1C in the last 72 hours. CBG: Recent Labs  Lab 07/08/20 0807 07/09/20 0800  GLUCAP 93 92   Lipid Profile: No results for input(s): CHOL, HDL, LDLCALC, TRIG,  CHOLHDL, LDLDIRECT in the last 72 hours. Thyroid Function Tests: No results for input(s): TSH, T4TOTAL, FREET4, T3FREE, THYROIDAB in the last 72 hours. Anemia Panel: No results for input(s): VITAMINB12, FOLATE, FERRITIN, TIBC, IRON, RETICCTPCT in the last 72 hours. Sepsis Labs: No results for input(s): PROCALCITON, LATICACIDVEN in the last 168 hours.  Recent Results (from the past 240 hour(s))  SARS Coronavirus 2 by RT PCR (hospital order, performed in [facility] hospital lab) Nasopharyngeal Nasopharyngeal Swab     Status: None   Collection Time: 07/06/20  1:37 PM   Specimen: Nasopharyngeal Swab  Result Value Ref Range Status   SARS Coronavirus 2 NEGATIVE NEGATIVE Final    Comment: (NOTE) SARS-CoV-2 target nucleic acids are NOT DETECTED.  The SARS-CoV-2 RNA is generally detectable in upper and lower respiratory specimens during the acute phase of infection. The lowest concentration of SARS-CoV-2 viral copies this assay can detect is 250 copies / mL. A negative result does not preclude SARS-CoV-2 infection and should not be used as the sole basis for treatment or other patient management decisions.  A negative result may occur with improper specimen collection / handling, submission of specimen other than nasopharyngeal swab, presence of viral mutation(s) within the areas targeted by this assay, and inadequate number of viral copies (<250 copies / mL). A negative result must be combined with  clinical observations, patient history, and epidemiological information.  Fact Sheet for Patients:   StrictlyIdeas.no  Fact Sheet for Healthcare Providers: BankingDealers.co.za  This test is not yet approved or  cleared by the [place] FDA and has been authorized for detection and/or diagnosis of SARS-CoV-2 by FDA under an Emergency Use Authorization (EUA).  This EUA will remain in effect (meaning this test can be used) for the duration of the COVID-19 declaration under Section 564(b)(1) of the Act, 21 U.S.C. section 360bbb-3(b)(1), unless the authorization is terminated or revoked sooner.  Performed at [facility], [address] [place], [place] [address]      RN Pressure Injury Documentation:     Estimated body mass index is 21.87 kg/m as calculated from the following:   Height as of this encounter: 6\' 3"  (1.905 m).   Weight as of this encounter: 79.4 kg.  Malnutrition Type:      Malnutrition Characteristics:      Nutrition Interventions:    Radiology Studies: No results found. Scheduled Meds: . allopurinol  300 mg Oral Daily  . amLODipine  10 mg Oral Daily  . atorvastatin  40 mg Oral Daily  . diltiazem  180 mg Oral Daily  . gabapentin  200 mg Oral QHS  . levothyroxine  88 mcg Oral Daily  . magnesium hydroxide  30 mL Oral Once  . melatonin  5 mg Oral QHS  . pantoprazole  40 mg Oral Daily  . sodium chloride flush  3 mL Intravenous Q12H   Continuous Infusions:   LOS: 1 day   Kerney Elbe, DO [facility] PAGER is on AMION  If 7PM-7AM, please contact night-coverage [email or web address]

## 2020-07-09 NOTE — Op Note (Signed)
[facility] Patient Name: Arthur Romero Procedure Date: 07/09/2020 2:08 PM MRN: [id number] Account #: [id number] Date of Birth: Oct 14, 1941 Admit Type: Inpatient Age: 79 Room: [facility] ENDO ROOM 4 Gender: Male Note Status: Finalized Procedure:             Upper GI endoscopy Indications:           Abnormal CT of the GI tract Providers:             Jonathon Bellows MD, MD Referring MD:          Olin Hauser (Referring MD) Medicines:             Monitored Anesthesia Care Complications:         No immediate complications. Procedure:             Pre-Anesthesia Assessment:                        - Prior to the procedure, a History and Physical was                         performed, and patient medications, allergies and                         sensitivities were reviewed. The patient's tolerance                         of previous anesthesia was reviewed.                        - The risks and benefits of the procedure and the                         sedation options and risks were discussed with the                         patient. All questions were answered and informed                         consent was obtained.                        - ASA Grade Assessment: III - A patient with severe                         systemic disease.                        After obtaining informed consent, the endoscope was                         passed under direct vision. Throughout the procedure,                         the patient's blood pressure, pulse, and oxygen                         saturations were monitored continuously. The Endoscope                         was introduced through  the mouth, and advanced to the                         third part of duodenum. The upper GI endoscopy was                         accomplished with ease. The patient tolerated the                         procedure well. Findings:      The examined esophagus was normal.       The examined duodenum was normal.      A small hiatal hernia was present.      Evidence of a Nissen fundoplication was found in the cardia. The wrap       appeared loose. This was traversed.      The cardia and gastric fundus were normal on retroflexion. Impression:            - Normal esophagus.                        - Normal examined duodenum.                        - Small hiatal hernia.                        - A Nissen fundoplication was found. The wrap appears                         loose.                        - No specimens collected. Recommendation:        - Perform a colonoscopy today. Procedure Code(s):     --- Professional ---                        [phone], Esophagogastroduodenoscopy, flexible,                         transoral; diagnostic, including collection of                         specimen(s) by brushing or washing, when performed                         (separate procedure) Diagnosis Code(s):     --- Professional ---                        K44.9, Diaphragmatic hernia without obstruction or                         gangrene                        [id number], Other specified postprocedural states                        R93.3, Abnormal findings on diagnostic imaging of  other parts of digestive tract CPT copyright 2019 American Medical Association. All rights reserved. The codes documented in this report are preliminary and upon coder review may  be revised to meet current compliance requirements. Jonathon Bellows, MD Jonathon Bellows MD, MD 07/09/2020 2:16:29 PM This report has been signed electronically. Number of Addenda: 0 Note Initiated On: 07/09/2020 2:08 PM Estimated Blood Loss:  Estimated blood loss: none.      [facility]

## 2020-07-09 NOTE — Telephone Encounter (Signed)
Receive fax request but don't see in his active medication list also [employer] had left message about this refill?

## 2020-07-09 NOTE — Anesthesia Preprocedure Evaluation (Signed)
Anesthesia Evaluation  Patient identified by MRN, date of birth, ID band Patient awake    Reviewed: Allergy & Precautions, NPO status , Patient's Chart, lab work & pertinent test results  History of Anesthesia Complications Negative for: history of anesthetic complications  Airway Mallampati: II  TM Distance: >3 FB Neck ROM: Full    Dental  (+) Poor Dentition, Missing   Pulmonary neg pulmonary ROS, neg sleep apnea, neg COPD,    breath sounds clear to auscultation- rhonchi (-) wheezing      Cardiovascular hypertension, Pt. on medications (-) CAD, (-) Past MI, (-) Cardiac Stents and (-) CABG + dysrhythmias Atrial Fibrillation  Rhythm:Regular Rate:Normal - Systolic murmurs and - Diastolic murmurs    Neuro/Psych neg Seizures negative neurological ROS  negative psych ROS   GI/Hepatic Neg liver ROS, GERD  ,  Endo/Other  neg diabetesHypothyroidism   Renal/GU negative Renal ROS     Musculoskeletal  (+) Arthritis ,   Abdominal (+) - obese,   Peds  Hematology negative hematology ROS (+)   Anesthesia Other Findings Past Medical History: No date: Arthritis No date: GERD (gastroesophageal reflux disease) No date: Hypertension No date: Rheumatoid arthritis (HCC) No date: Thyroid disease   Reproductive/Obstetrics                             Anesthesia Physical Anesthesia Plan  ASA: III  Anesthesia Plan: General   Post-op Pain Management:    Induction: Intravenous  PONV Risk Score and Plan: 1 and Propofol infusion  Airway Management Planned: Natural Airway  Additional Equipment:   Intra-op Plan:   Post-operative Plan:   Informed Consent: I have reviewed the patients History and Physical, chart, labs and discussed the procedure including the risks, benefits and alternatives for the proposed anesthesia with the patient or authorized representative who has indicated his/her understanding  and acceptance.     Dental advisory given  Plan Discussed with: CRNA and Anesthesiologist  Anesthesia Plan Comments:         Anesthesia Quick Evaluation

## 2020-07-10 ENCOUNTER — Institutional Professional Consult (permissible substitution): Payer: Medicare PPO | Admitting: Cardiology

## 2020-07-10 DIAGNOSIS — I480 Paroxysmal atrial fibrillation: Secondary | ICD-10-CM

## 2020-07-10 DIAGNOSIS — M1A9XX0 Chronic gout, unspecified, without tophus (tophi): Secondary | ICD-10-CM

## 2020-07-10 DIAGNOSIS — I10 Essential (primary) hypertension: Secondary | ICD-10-CM

## 2020-07-10 LAB — CBC WITH DIFFERENTIAL/PLATELET
Abs Immature Granulocytes: 0.07 10*3/uL (ref 0.00–0.07)
Basophils Absolute: 0.1 10*3/uL (ref 0.0–0.1)
Basophils Relative: 1 %
Eosinophils Absolute: 0.5 10*3/uL (ref 0.0–0.5)
Eosinophils Relative: 4 %
HCT: 37.8 % — ABNORMAL LOW (ref 39.0–52.0)
Hemoglobin: 13.1 g/dL (ref 13.0–17.0)
Immature Granulocytes: 1 %
Lymphocytes Relative: 22 %
Lymphs Abs: 2.5 10*3/uL (ref 0.7–4.0)
MCH: 32 pg (ref 26.0–34.0)
MCHC: 34.7 g/dL (ref 30.0–36.0)
MCV: 92.4 fL (ref 80.0–100.0)
Monocytes Absolute: 0.9 10*3/uL (ref 0.1–1.0)
Monocytes Relative: 8 %
Neutro Abs: 7.2 10*3/uL (ref 1.7–7.7)
Neutrophils Relative %: 64 %
Platelets: 292 10*3/uL (ref 150–400)
RBC: 4.09 MIL/uL — ABNORMAL LOW (ref 4.22–5.81)
RDW: 14.4 % (ref 11.5–15.5)
WBC: 11.2 10*3/uL — ABNORMAL HIGH (ref 4.0–10.5)
nRBC: 0 % (ref 0.0–0.2)

## 2020-07-10 LAB — GLUCOSE, CAPILLARY: Glucose-Capillary: 88 mg/dL (ref 70–99)

## 2020-07-10 LAB — COMPREHENSIVE METABOLIC PANEL
ALT: 13 U/L (ref 0–44)
AST: 15 U/L (ref 15–41)
Albumin: 3.6 g/dL (ref 3.5–5.0)
Alkaline Phosphatase: 80 U/L (ref 38–126)
Anion gap: 9 (ref 5–15)
BUN: 10 mg/dL (ref 8–23)
CO2: 27 mmol/L (ref 22–32)
Calcium: 9.1 mg/dL (ref 8.9–10.3)
Chloride: 105 mmol/L (ref 98–111)
Creatinine, Ser: 0.97 mg/dL (ref 0.61–1.24)
GFR calc Af Amer: >60 mL/min (ref >60)
GFR calc non Af Amer: >60 mL/min (ref >60)
Glucose, Bld: 90 mg/dL (ref 70–99)
Potassium: 4.2 mmol/L (ref 3.5–5.1)
Sodium: 141 mmol/L (ref 135–145)
Total Bilirubin: 0.7 mg/dL (ref 0.3–1.2)
Total Protein: 6.4 g/dL — ABNORMAL LOW (ref 6.5–8.1)

## 2020-07-10 LAB — PHOSPHORUS: Phosphorus: 4.3 mg/dL (ref 2.5–4.6)

## 2020-07-10 LAB — MAGNESIUM: Magnesium: 1.8 mg/dL (ref 1.7–2.4)

## 2020-07-10 MED ORDER — OMEPRAZOLE 40 MG PO CPDR: 40.0000 mg | DELAYED_RELEASE_CAPSULE | Freq: Every day | ORAL | 0 refills | Status: DC

## 2020-07-10 NOTE — Evaluation (Signed)
Occupational Therapy Evaluation Patient Details Name: Arthur Romero MRN: [id number] DOB: [birth year] Today's Date: 07/10/2020    History of Present Illness Patient is a 79 year old male who underwent colonoscopy on 9/14 which showed internal hemorrhoids secondary to presenting to ED for bleeding per rectum.  PMH includes arthritis, GERD, HTN, RA, thyroid disease, a fib, AKI, neuropathy, hearing loss, depression. Patient is currently receiving HHPT and has an aide.   Clinical Impression   Patient presenting with decreased I in self care, balance, functional transfers/mobility, endurance, and safety awareness. Patient reports being mod I with self care tasks and functional transfers PTA. He was receiving HHPT and working on advancing to cane prior to admission. His sister spends the night and is present to assist him in/out of shower for safety. Family assist with IADL tasks.  Patient currently functioning at supervision - min guard with use of RW. Patient will benefit from acute OT to increase overall independence in the areas of ADLs, functional mobility, and safety awareness in order to safely discharge home with caregiver.Pt plans on having family member come 24/7 for a few days after discharge and then intermittently. No OT follow up recommended at discharge but pt would benefit from acute OT intervention to address deficits.      Follow Up Recommendations  No OT follow up;Supervision - Intermittent    Equipment Recommendations  None recommended by OT       Precautions / Restrictions Precautions Precautions: Fall Restrictions Weight Bearing Restrictions: No      Mobility Bed Mobility Overal bed mobility: Needs Assistance Bed Mobility: Sidelying to Sit;Supine to Sit;Sit to Supine     Supine to sit: Supervision Sit to supine: Supervision   General bed mobility comments: min cuing for technique and hand placement  Transfers Overall transfer level: Needs assistance Equipment  used: Standard walker Transfers: Sit to/from Stand;Stand Pivot Transfers Sit to Stand: Min guard Stand pivot transfers: Supervision       General transfer comment: patient verbalizes steps for proper hand placement and sequencing demonstrating understanding of safety, room set up, and deficits of self.    Balance Overall balance assessment: Needs assistance Sitting-balance support: Feet supported Sitting balance-Leahy Scale: Fair Sitting balance - Comments: able to sit with feet supported and kick LE's bilaterally.   Standing balance support: Bilateral upper extremity supported;During functional activity Standing balance-Leahy Scale: Fair Standing balance comment: Patient requires UE support on walker for dynamic mobility/ambulation       ADL either performed or assessed with clinical judgement   ADL Overall ADL's : Needs assistance/impaired      General ADL Comments: Pt requires supervision overall for safety with functional transfers and standing balance with LB clothing management. OT discussed how to increase safety with these tasks at home. Pt reports he does not shower unless sister is present.     Vision Baseline Vision/History: Wears glasses Wears Glasses: At all times              Pertinent Vitals/Pain Pain Assessment: No/denies pain        Extremity/Trunk Assessment Upper Extremity Assessment Upper Extremity Assessment: Generalized weakness   Lower Extremity Assessment Lower Extremity Assessment: Defer to PT evaluation RLE Deficits / Details: grossly 4/5 RLE Sensation: decreased light touch;history of peripheral neuropathy RLE Coordination: WNL LLE Deficits / Details: grossly 4-/5 LLE Sensation: decreased light touch LLE Coordination: WNL       Communication Communication Communication: HOH   Cognition Arousal/Alertness: Awake/alert Behavior During Therapy: WFL for tasks  assessed/performed Overall Cognitive Status: Within Functional Limits for  tasks assessed       General Comments: Patient is eager to participate with PT despite fatigue.   General Comments  Patient appears frail    Exercises Other Exercises Other Exercises: Patient educated on roll of PT in acute care setting, safe transfers and ambulation with RW.        Home Living Family/patient expects to be discharged to:: Private residence Living Arrangements: Other (Comment) Available Help at Discharge: Family;Home health Type of Home: House Home Access: Stairs to enter [employer] of Steps: 4 Entrance Stairs-Rails: Right Home Layout: One level     Bathroom Shower/Tub: Occupational psychologist: Handicapped height     [place]: Grab bars - tub/shower;Walker - 2 wheels   Additional Comments: Patient reports living alone but has an aide and HHPT, two sisters are able to come and spend the night as needed.      Prior Functioning/Environment Level of Independence: Needs assistance  Gait / Transfers Assistance Needed: Patient uses a RW at baseline. Having some difficulty with balance/steadying self. ADL's / Homemaking Assistance Needed: Sisters and aide have helped with meal prep   Comments: Patient reports using a walker at baseline, is working with HHPT for balance.        OT Problem List: Decreased strength;Decreased activity tolerance;Decreased safety awareness;Impaired balance (sitting and/or standing)      OT Treatment/Interventions: Self-care/ADL training;Therapeutic exercise;Therapeutic activities;Energy conservation;Patient/family education;Balance training;DME and/or AE instruction    OT Goals(Current goals can be found in the care plan section) Acute Rehab OT Goals Patient Stated Goal: to return home OT Goal Formulation: With patient Time For Goal Achievement: 07/24/20 Potential to Achieve Goals: Good ADL Goals Pt Will Perform Grooming: with modified independence;standing Pt Will Perform Lower Body Dressing: with  modified independence;sit to/from stand Pt Will Transfer to Toilet: with modified independence;ambulating Pt Will Perform Toileting - Clothing Manipulation and hygiene: with modified independence;sit to/from stand  OT Frequency: Min 1X/week   Barriers to D/C: Other (comment)  none known at this time          AM-PAC OT "6 Clicks" Daily Activity     Outcome Measure Help from another person eating meals?: None Help from another person taking care of personal grooming?: None Help from another person toileting, which includes using toliet, bedpan, or urinal?: A Little Help from another person bathing (including washing, rinsing, drying)?: A Little Help from another person to put on and taking off regular upper body clothing?: None Help from another person to put on and taking off regular lower body clothing?: A Little 6 Click Score: 21   End of Session Equipment Utilized During Treatment: Rolling walker Nurse Communication: Other (comment) (giving medications at therapist left the room)  Activity Tolerance: Patient tolerated treatment well Patient left: in chair;with call bell/phone within reach;with chair alarm set;with nursing/sitter in room  OT Visit Diagnosis: Muscle weakness (generalized) (M62.81)                Time: 1443-1540 OT Time Calculation (min): 25 min Charges:  OT General Charges $OT Visit: 1 Visit OT Evaluation $OT Eval Low Complexity: 1 Low OT Treatments $Self Care/Home Management : 8-22 mins  Darleen Crocker, MS, OTR/L , CBIS ascom [phone]  07/10/20, 11:09 AM

## 2020-07-10 NOTE — Progress Notes (Signed)
Brief Progress Note Reviewed colonoscopy from 09/14 which showed internal hemorrhoids and was otherwise unremarkable. Nothing further to add from surgical standpoint. We will sign off. Please call with questions/concerns.   -- Edison Simon, PA-C [facility] 07/10/2020, 7:15 AM [phone] M-F: 7am - 4pm

## 2020-07-10 NOTE — Evaluation (Signed)
Physical Therapy Evaluation Patient Details Name: Arthur Romero MRN: [id number] DOB: September 02, 1941 Today's Date: 07/10/2020   History of Present Illness  Patient is a 79 year old male who underwent colonoscopy on 9/14 which showed internal hemorrhoids secondary to presenting to ED for bleeding per rectum.  PMH includes arthritis, GERD, HTN, RA, thyroid disease, a fib, AKI, neuropathy, hearing loss, depression. Patient is currently receiving HHPT and has an aide.    Clinical Impression  Patient is a pleasant 79 year old male who presents with generalized weakness. Patient lives at home alone and has an aide and HHPT. He utilizes a walker at baseline and has two sisters who are nearby and can come stay with him as needed. Patient is in chair upon PT arrival, requires floor to be dried initially as it had been mopped as PT was setting up room. Upon drying floor, physician entered room for exam. After physician finished exam patient was agreeable to PT eval and performed ambulation within room. Patient transferred from chair safely while verbalizing step by step cues demonstrating good safety awareness and awareness of self deficits. Patient is able to negotiate obstacles throughout room without LOB however gait is slow and patient is dependent upon walker. Patient returned to chair with all needs met and chair alarm on. Patient will benefit from continued physical therapy to progress strength, mobility, and stability. Upon discharge patient will benefit from HHPT and 24/7 supervision/aide to decrease fall risk.     Follow Up Recommendations Home health PT;Supervision/Assistance - 24 hour    Equipment Recommendations  3in1 (PT)    Recommendations for Other Services       Precautions / Restrictions Precautions Precautions: Fall Restrictions Weight Bearing Restrictions: No      Mobility  Bed Mobility               General bed mobility comments: Patient in chair upon PT arrival,  returns to chair at end of session.  Transfers Overall transfer level: Needs assistance   Transfers: Sit to/from Stand Sit to Stand: Min guard;Supervision         General transfer comment: patient verbalizes steps for proper hand placement and sequencing demonstrating understanding of safety, room set up, and deficits of self.  Ambulation/Gait Ambulation/Gait assistance: Min guard;Supervision Gait Distance (Feet): 42 Feet Assistive device: Rolling walker (2 wheeled)   Gait velocity: decreased   General Gait Details: Patient ambulates with short step length bilaterally, slight trunk flexion (patient c/o stomach discomfort/gassy feeling). able to negotiate obstacles of room x 3 laps within room  Stairs            Wheelchair Mobility    Modified Rankin (Stroke Patients Only)       Balance Overall balance assessment: Needs assistance Sitting-balance support: Feet supported Sitting balance-Leahy Scale: Fair Sitting balance - Comments: able to sit with feet supported and kick LE's bilaterally.   Standing balance support: Bilateral upper extremity supported;During functional activity Standing balance-Leahy Scale: Fair Standing balance comment: Patient requires UE support on walker for dynamic mobility/ambulation                             Pertinent Vitals/Pain Pain Assessment: No/denies pain    Home Living Family/patient expects to be discharged to:: Private residence Living Arrangements: Other (Comment) (sisters can come stay the night as he needs) Available Help at Discharge: Family;Home health Type of Home: House Home Access: Stairs to enter Entrance Stairs-Rails: Right  Entrance Stairs-Number of Steps: 4 Home Layout: One level Home Equipment: Grab bars - tub/shower;Walker - 2 wheels Additional Comments: Patient reports living alone but has an aide and HHPT, two sisters are able to come and spend the night as needed.    Prior Function Level of  Independence: Needs assistance   Gait / Transfers Assistance Needed: Patient uses a RW at baseline. Having some difficulty with balance/steadying self.  ADL's / Homemaking Assistance Needed: Sisters and aide have helped with meal prep  Comments: Patient reports using a walker at baseline, is working with HHPT for balance.     Hand Dominance        Extremity/Trunk Assessment   Upper Extremity Assessment Upper Extremity Assessment: Defer to OT evaluation    Lower Extremity Assessment Lower Extremity Assessment: Generalized weakness;RLE deficits/detail;LLE deficits/detail RLE Deficits / Details: grossly 4/5 RLE Sensation: decreased light touch;history of peripheral neuropathy RLE Coordination: WNL LLE Deficits / Details: grossly 4-/5 LLE Sensation: decreased light touch LLE Coordination: WNL       Communication   Communication: HOH  Cognition Arousal/Alertness: Awake/alert Behavior During Therapy: WFL for tasks assessed/performed Overall Cognitive Status: Within Functional Limits for tasks assessed                                 General Comments: Patient is eager to participate with PT despite fatigue.      General Comments General comments (skin integrity, edema, etc.): Patient appears frail    Exercises Other Exercises Other Exercises: Patient educated on roll of PT in acute care setting, safe transfers and ambulation with RW.   Assessment/Plan    PT Assessment Patient needs continued PT services  PT Problem List Decreased strength;Decreased activity tolerance;Decreased balance;Decreased mobility;Impaired sensation       PT Treatment Interventions DME instruction;Gait training;Stair training;Functional mobility training;Therapeutic activities;Patient/family education;Neuromuscular re-education;Balance training;Therapeutic exercise    PT Goals (Current goals can be found in the Care Plan section)  Acute Rehab PT Goals Patient Stated Goal: to  return home PT Goal Formulation: With patient Time For Goal Achievement: 07/24/20 Potential to Achieve Goals: Fair    Frequency Min 2X/week   Barriers to discharge   will benefit from aide +HHPT    Co-evaluation               AM-PAC PT "6 Clicks" Mobility  Outcome Measure Help needed turning from your back to your side while in a flat bed without using bedrails?: None Help needed moving from lying on your back to sitting on the side of a flat bed without using bedrails?: A Little Help needed moving to and from a bed to a chair (including a wheelchair)?: A Little Help needed standing up from a chair using your arms (e.g., wheelchair or bedside chair)?: A Little Help needed to walk in hospital room?: A Little Help needed climbing 3-5 steps with a railing? : A Little 6 Click Score: 19    End of Session Equipment Utilized During Treatment: Gait belt Activity Tolerance: Patient tolerated treatment well;Patient limited by fatigue Patient left: in chair;with call bell/phone within reach;with chair alarm set Nurse Communication: Mobility status PT Visit Diagnosis: Unsteadiness on feet (R26.81);Other abnormalities of gait and mobility (R26.89);Muscle weakness (generalized) (M62.81)    Time: 7092-9574 PT Time Calculation (min) (ACUTE ONLY): 16 min   Charges:   PT Evaluation $PT Eval Moderate Complexity: 1 Mod         Lakaisha Danish  Chabely Norby, PT, DPT   07/10/2020, 10:38 AM

## 2020-07-10 NOTE — Discharge Summary (Signed)
Arthur Romero:[id number] DOB: [birth year] DOA: 07/06/2020  PCP: Olin Hauser, DO  Admit date: 07/06/2020 Discharge date: 07/10/2020  Admitted From: home Disposition:  home  Recommendations for Outpatient Follow-up:  1. Follow up with PCP in 1 week 2. Please obtain BMP/CBC in one week    Home Health:PT, NSG    Discharge Condition:Stable CODE STATUS:full  Diet recommendation: Heart Healthy  Brief/Interim Summary: Arthur A Crawfordis a 79 y.o.malewith medical history significant ofhypertension, persistent atrial fibrillation, rheumatoid arthritis, thyroid disease and GERD presented to ED with complaint of bleeding per rectum. Patient saw some blood initially during wiping after using the toilet ,bleeding increased to the point that he can see blood in the toilet bowl. He was complaining of mild discomfort in the rectal area. Denies any fever or chills. He was also complaining of some constipation  . Patient was admitted to the hospitalist service.  His Xarelto and aspirin were held.  GI was consulted.  Patient underwent colonoscopy and EGD.  He was found with internal hemorrhoids.  BRBPR/Lower GI Bleed. Normocytic Anemia  Status post EGD and colonoscopy-found with internal hemorrhoids H&H remained stable.  Did not require any transfusion.  Hypertension. Continue home medications on discharge  Persistent Atrial Fibrillation -Rate well controlled and appears in sinus rhythm. Via chat GI Dr. Vicente Males cleared patient to be restarted on his Xarelto Continue Cardizem  Partially herniated Nissen Fundoplication Surgery was consulted, no intervention needed and they signed off  Mixed Hyperlipidemia. Continue home statin Hold aspirin until sees PCP to see if he still requires aspirin just to minimize any further bleeding   Acquired Hypothyroidism. 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  -Follow-up with MRI/MRCP in 1 year is suggested as an outpatient   GERD Continue PPI  Mononeuropathy Continue gabapentin teen  Gout Continue allopurinol   Leukocytosis, improving -appears to be chronically elevated.  Has decreased mildly.  Likely reactive in the setting of above. Can monitor as outpatient.  Afebrile no sinus symptoms of infection currently.    Discharge Diagnoses:  Active Problems:   Lower GI bleed   GI bleeding    Discharge Instructions  Discharge Instructions    Call MD for:  persistant dizziness or light-headedness   Complete by: As directed    Diet - low sodium heart healthy   Complete by: As directed    Discharge instructions   Complete by: As directed    Walk carefully F/u with pcp in one week Stop aspirin until you see your pcp   Increase activity slowly   Complete by: As directed      Allergies as of 07/10/2020      Reactions   Ace Inhibitors Swelling   Lip swelling Lip swelling Lip swelling   Prednisone Hives      Medication List    STOP taking these medications   aspirin 81 MG chewable tablet     TAKE these medications   acetaminophen 500 MG tablet Commonly known as: TYLENOL Take 500-1,000 mg by mouth every 6 (six) hours as needed for mild pain or fever.   allopurinol 300 MG tablet Commonly known as: ZYLOPRIM Take 300 mg by mouth daily.   amLODipine 10 MG tablet Commonly known as: NORVASC Take 10 mg by mouth daily.   atorvastatin 40 MG tablet Commonly known as: LIPITOR Take 1 tablet (40  mg total) by mouth daily. What changed: when to take this   diltiazem 180 MG 24 hr capsule Commonly known as: Cardizem CD Take 1 capsule (180 mg total) by mouth daily.   gabapentin 100 MG capsule Commonly known as: NEURONTIN Start 1 capsule daily at bed time, increase by 1 cap every 2-3 days as tolerated up to take 3 at once  in evening. What changed:   how much to take  how to take this  when to take this  additional instructions   levothyroxine 88 MCG tablet Commonly known as: SYNTHROID Take 1 tablet (88 mcg total) by mouth daily.   melatonin 3 MG Tabs tablet Take 3 mg by mouth at bedtime as needed (sleep).   omeprazole 40 MG capsule Commonly known as: PRILOSEC Take 1 capsule (40 mg total) by mouth daily. What changed:   medication strength  how much to take   rivaroxaban 20 MG Tabs tablet Commonly known as: XARELTO Take 1 tablet (20 mg total) by mouth daily with supper.       Follow-up Information    Olin Hauser, DO Follow up in 1 week(s).   Specialty: Family Medicine Contact information: [address] [place] [address] [phone]              Allergies  Allergen Reactions  . Ace Inhibitors Swelling    Lip swelling Lip swelling Lip swelling   . Prednisone Hives    Consultations:  GI, general surgery   Procedures/Studies: CT ABDOMEN PELVIS W CONTRAST  Result Date: 07/06/2020 CLINICAL DATA:  Diverticulitis suspected EXAM: CT ABDOMEN AND PELVIS WITH CONTRAST TECHNIQUE: Multidetector CT imaging of the abdomen and pelvis was performed using the standard protocol following bolus administration of intravenous contrast. CONTRAST:  115mL OMNIPAQUE IOHEXOL 300 MG/ML  SOLN COMPARISON:  Abdomen and pelvis of May 18, 2020 FINDINGS: Lower chest: Incidental imaging of the lung bases is unremarkable. No pericardial effusion. Heart is incompletely imaged. Hepatobiliary: Liver without focal, suspicious hepatic lesion. No pericholecystic stranding. No biliary duct dilation. Pancreas: Pancreas with small cystic focus in the neck of the pancreas unchanged since previous study May 18, 2020 approximately 1.4 cm with an adjacent smaller, subcentimeter focus in the pancreas. No current peripancreatic stranding. Spleen: Spleen normal in size and contour with splenic  calcification. Adrenals/Urinary Tract: Adrenal glands are normal. RIGHT renal cyst, moderately large unchanged. No hydronephrosis. No nephrolithiasis. No visible ureteral calculi. 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 Stomach/Bowel: Signs of Nissen fundoplication partially herniated into the chest. Mildly patulous appearance of the distal esophagus. No acute small bowel process. The appendix is normal. Colon largely stool filled. Fullness of the sphincter complex with mild stranding in the perianal area. No fluid collection. Thickening along the upper margin of the sphincter complex in the distal rectum with nodular features. This is just above the pelvic floor and extending into the sphincter complex. Vascular/Lymphatic: Calcified and noncalcified atheromatous plaque of the abdominal aorta. There is no gastrohepatic or hepatoduodenal ligament lymphadenopathy. No retroperitoneal or mesenteric lymphadenopathy. No pelvic sidewall lymphadenopathy. Reproductive: Unremarkable CT appearance of the prostate. Other: Signs of ventral abdominal wall reconstruction with mesh. Small umbilical hernia at just below the margin of the reconstructive changes not changed from previous exam. Musculoskeletal: Spinal degenerative changes. Osteopenia. No acute musculoskeletal process. IMPRESSION: 1. Nodular changes of the distal rectum and upper anal canal with some suggestion of inflammation about the perineum. Findings may be due to acute proctitis and anal inflammation. A  low rectal neoplasm/malignancy is also considered. Correlation with digital rectal examination may be helpful. Follow-up with direct visualization is suggested. 2. Signs of Nissen fundoplication partially herniated into the chest. Mildly patulous appearance of the distal esophagus. Correlate with any upper abdominal symptoms. 3. 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 4. Small cystic focus in the neck of the pancreas  unchanged since May 18, 2020 approximately 1.4 cm with an adjacent smaller, subcentimeter focus in the pancreas. Follow-up with MRI/MRCP in 1 year is suggested. 5. Aortic atherosclerosis. 6. Signs of ventral abdominal wall reconstruction with mesh. Small umbilical hernia at just below the margin of the reconstructive changes not changed from previous exam. Aortic Atherosclerosis (ICD10-I70.0). Electronically Signed   By: Zetta Bills M.D.   On: 07/06/2020 14:34       Subjective:  Has no complaints.  Denies any dizziness, lightheadedness, or any further bleed from rectum   discharge Exam: Vitals:   07/09/20 2312 07/10/20 0745  BP: (!) 101/55 118/79  Pulse: 69 68  Resp: 16 18  Temp: 98.6 F (37 C) 98.4 F (36.9 C)  SpO2: 97% 98%   Vitals:   07/09/20 1504 07/09/20 1610 07/09/20 2312 07/10/20 0745  BP:  127/82 (!) 101/55 118/79  Pulse: 76 77 69 68  Resp: 17 16 16 18   Temp:  98.1 F (36.7 C) 98.6 F (37 C) 98.4 F (36.9 C)  TempSrc:  Oral Oral Oral  SpO2: 98% 100% 97% 98%  Weight:      Height:        General: Pt is alert, awake, not in acute distress Cardiovascular: RRR, S1/S2 +, no rubs, no gallops Respiratory: CTA bilaterally, no wheezing, no rhonchi Abdominal: Soft, NT, ND, bowel sounds + Extremities: no edema, no cyanosis    The results of significant diagnostics from this hospitalization (including imaging, microbiology, ancillary and laboratory) are listed below for reference.     Microbiology: Recent Results (from the past 240 hour(s))  SARS Coronavirus 2 by RT PCR (hospital order, performed in [facility] hospital lab) Nasopharyngeal Nasopharyngeal Swab     Status: None   Collection Time: 07/06/20  1:37 PM   Specimen: Nasopharyngeal Swab  Result Value Ref Range Status   SARS Coronavirus 2 NEGATIVE NEGATIVE Final    Comment: (NOTE) SARS-CoV-2 target nucleic acids are NOT DETECTED.  The SARS-CoV-2 RNA is generally detectable in upper and lower respiratory  specimens during the acute phase of infection. The lowest concentration of SARS-CoV-2 viral copies this assay can detect is 250 copies / mL. A negative result does not preclude SARS-CoV-2 infection and should not be used as the sole basis for treatment or other patient management decisions.  A negative result may occur with improper specimen collection / handling, submission of specimen other than nasopharyngeal swab, presence of viral mutation(s) within the areas targeted by this assay, and inadequate number of viral copies (<250 copies / mL). A negative result must be combined with clinical observations, patient history, and epidemiological information.  Fact Sheet for Patients:   StrictlyIdeas.no  Fact Sheet for Healthcare Providers: BankingDealers.co.za  This test is not yet approved or  cleared by the [place] FDA and has been authorized for detection and/or diagnosis of SARS-CoV-2 by FDA under an Emergency Use Authorization (EUA).  This EUA will remain in effect (meaning this test can be used) for the duration of the COVID-19 declaration under Section 564(b)(1) of the Act, 21 U.S.C. section 360bbb-3(b)(1), unless the authorization is terminated or  revoked sooner.  Performed at [facility] [address] [place], [place] [address]      Labs: BNP (last 3 results) No results for input(s): BNP in the last 8760 hours. Basic Metabolic Panel: Recent Labs  Lab 07/06/20 0806 07/07/20 0348 07/08/20 0248 07/09/20 0711 07/10/20 0404  NA 139 141 140 141 141  K 3.9 4.0 3.7 3.9 4.2  CL 101 105 102 105 105  CO2 28 28 26 26 27   GLUCOSE 107* 83 97 95 90  BUN 12 13 12 8 10   CREATININE 0.85 0.84 0.83 0.86 0.97  CALCIUM 9.2 9.0 8.9 9.1 9.1  MG  --   --  2.0 1.9 1.8  PHOS  --   --  4.1 3.6 4.3   Liver Function Tests: Recent Labs  Lab 07/06/20 0806 07/08/20 0248 07/09/20 0711 07/10/20 0404  AST 17 14* 15 15   ALT 18 16 15 13   ALKPHOS 99 91 88 80  BILITOT 0.7 0.7 0.7 0.7  PROT 7.6 6.8 6.8 6.4*  ALBUMIN 4.1 3.6 3.8 3.6   No results for input(s): LIPASE, AMYLASE in the last 168 hours. No results for input(s): AMMONIA in the last 168 hours. CBC: Recent Labs  Lab 07/06/20 1504 07/07/20 0348 07/08/20 0248 07/09/20 0711 07/10/20 0404  WBC 13.9* 13.4* 12.6* 11.4* 11.2*  NEUTROABS 11.2*  --  9.1* 8.4* 7.2  HGB 13.1 12.8* 13.5 13.8 13.1  HCT 38.4* 37.8* 38.5* 40.4 37.8*  MCV 93.4 94.5 90.4 93.3 92.4  PLT 260 279 297 308 292   Cardiac Enzymes: No results for input(s): CKTOTAL, CKMB, CKMBINDEX, TROPONINI in the last 168 hours. BNP: Invalid input(s): POCBNP CBG: Recent Labs  Lab 07/08/20 0807 07/09/20 0800 07/10/20 0747  GLUCAP 93 92 88   D-Dimer No results for input(s): DDIMER in the last 72 hours. Hgb A1c No results for input(s): HGBA1C in the last 72 hours. Lipid Profile No results for input(s): CHOL, HDL, LDLCALC, TRIG, CHOLHDL, LDLDIRECT in the last 72 hours. Thyroid function studies No results for input(s): TSH, T4TOTAL, T3FREE, THYROIDAB in the last 72 hours.  Invalid input(s): FREET3 Anemia work up No results for input(s): VITAMINB12, FOLATE, FERRITIN, TIBC, IRON, RETICCTPCT in the last 72 hours. Urinalysis    Component Value Date/Time   COLORURINE YELLOW (A) 05/18/2020 1847   APPEARANCEUR HAZY (A) 05/18/2020 1847   LABSPEC 1.016 05/18/2020 1847   PHURINE 5.0 05/18/2020 1847   GLUCOSEU NEGATIVE 05/18/2020 1847   HGBUR NEGATIVE 05/18/2020 1847   BILIRUBINUR NEGATIVE 05/18/2020 1847   KETONESUR NEGATIVE 05/18/2020 1847   PROTEINUR NEGATIVE 05/18/2020 1847   NITRITE NEGATIVE 05/18/2020 1847   LEUKOCYTESUR NEGATIVE 05/18/2020 1847   Sepsis Labs Invalid input(s): PROCALCITONIN,  WBC,  LACTICIDVEN Microbiology Recent Results (from the past 240 hour(s))  SARS Coronavirus 2 by RT PCR (hospital order, performed in [place] hospital lab) Nasopharyngeal  Nasopharyngeal Swab     Status: None   Collection Time: 07/06/20  1:37 PM   Specimen: Nasopharyngeal Swab  Result Value Ref Range Status   SARS Coronavirus 2 NEGATIVE NEGATIVE Final    Comment: (NOTE) SARS-CoV-2 target nucleic acids are NOT DETECTED.  The SARS-CoV-2 RNA is generally detectable in upper and lower respiratory specimens during the acute phase of infection. The lowest concentration of SARS-CoV-2 viral copies this assay can detect is 250 copies / mL. A negative result does not preclude SARS-CoV-2 infection and should not be used as the sole basis for treatment or other patient management decisions.  A negative result may occur with improper specimen collection / handling, submission of specimen other than nasopharyngeal swab, presence of viral mutation(s) within the areas targeted by this assay, and inadequate number of viral copies (<250 copies / mL). A negative result must be combined with clinical observations, patient history, and epidemiological information.  Fact Sheet for Patients:   StrictlyIdeas.no  Fact Sheet for Healthcare Providers: BankingDealers.co.za  This test is not yet approved or  cleared by the [place] FDA and has been authorized for detection and/or diagnosis of SARS-CoV-2 by FDA under an Emergency Use Authorization (EUA).  This EUA will remain in effect (meaning this test can be used) for the duration of the COVID-19 declaration under Section 564(b)(1) of the Act, 21 U.S.C. section 360bbb-3(b)(1), unless the authorization is terminated or revoked sooner.  Performed at [facility], [address], [place], [place] [address]      Time coordinating discharge: Over 30 minutes  SIGNED:   Nolberto Hanlon, MD  [facility] 07/10/2020, 2:43 PM Pager   If 7PM-7AM, please contact night-coverage [email or web address] Password [email or web address]

## 2020-07-11 ENCOUNTER — Telehealth: Payer: Self-pay

## 2020-07-11 DIAGNOSIS — E039 Hypothyroidism, unspecified: Secondary | ICD-10-CM | POA: Diagnosis not present

## 2020-07-11 DIAGNOSIS — E860 Dehydration: Secondary | ICD-10-CM | POA: Diagnosis not present

## 2020-07-11 DIAGNOSIS — I10 Essential (primary) hypertension: Secondary | ICD-10-CM | POA: Diagnosis not present

## 2020-07-11 DIAGNOSIS — K219 Gastro-esophageal reflux disease without esophagitis: Secondary | ICD-10-CM | POA: Diagnosis not present

## 2020-07-11 DIAGNOSIS — E222 Syndrome of inappropriate secretion of antidiuretic hormone: Secondary | ICD-10-CM | POA: Diagnosis not present

## 2020-07-11 DIAGNOSIS — I4819 Other persistent atrial fibrillation: Secondary | ICD-10-CM | POA: Diagnosis not present

## 2020-07-11 DIAGNOSIS — I2510 Atherosclerotic heart disease of native coronary artery without angina pectoris: Secondary | ICD-10-CM | POA: Diagnosis not present

## 2020-07-11 DIAGNOSIS — M109 Gout, unspecified: Secondary | ICD-10-CM | POA: Diagnosis not present

## 2020-07-11 DIAGNOSIS — E440 Moderate protein-calorie malnutrition: Secondary | ICD-10-CM | POA: Diagnosis not present

## 2020-07-11 NOTE — Telephone Encounter (Signed)
Transition Care Management Follow-up Telephone Call  Date of discharge and from where:  [facility] 9/15  How have you been since you were released from the hospital? Legs are feeling weak  Any questions or concerns? No  Items Reviewed:  Did the pt receive and understand the discharge instructions provided? Yes   Medications obtained and verified? Yes   Any new allergies since your discharge? No   Dietary orders reviewed? Yes  Do you have support at home? Yes   Functional Questionnaire: (I = Independent and D = Dependent) ADLs: I  Bathing/Dressing- I  Meal Prep- D  Eating- I  Maintaining continence- I  Transferring/Ambulation- I  Managing Meds- I, niece helped fill pill box  Follow up appointments reviewed:   PCP Hospital f/u appt confirmed? Yes  Scheduled to see Dr. Parks Ranger on 07/15/2020 @ 10:40.  Are transportation arrangements needed? No   If their condition worsens, is the pt aware to call PCP or go to the Emergency Dept.? Yes  Was the patient provided with contact information for the PCP's office or ED? Yes  Was to pt encouraged to call back with questions or concerns? Yes

## 2020-07-12 ENCOUNTER — Telehealth: Payer: Self-pay | Admitting: Family Medicine

## 2020-07-12 NOTE — Telephone Encounter (Signed)
Verbal given for Social worker and HHA and some one call next week for Home health verbal.   Caller/Agency: Altha Harm / [place] Number: [phone] ok to LM Requesting OT/PT/Skilled Nursing/Social Work/Speech Therapy: Home Health  Frequency: once a week 4 wks with 2 PRNs. Requesting HHA and Social Worker to be added    This is the info on Arthur Romero verbal order I sent

## 2020-07-15 ENCOUNTER — Encounter: Payer: Self-pay | Admitting: Family Medicine

## 2020-07-15 ENCOUNTER — Other Ambulatory Visit: Payer: Self-pay

## 2020-07-15 ENCOUNTER — Ambulatory Visit (INDEPENDENT_AMBULATORY_CARE_PROVIDER_SITE_OTHER): Payer: Medicare PPO | Admitting: Family Medicine

## 2020-07-15 VITALS — BP 125/63 | HR 77 | Temp 97.1°F | Resp 16 | Ht 75.0 in | Wt 169.0 lb

## 2020-07-15 DIAGNOSIS — I4819 Other persistent atrial fibrillation: Secondary | ICD-10-CM | POA: Diagnosis not present

## 2020-07-15 DIAGNOSIS — E440 Moderate protein-calorie malnutrition: Secondary | ICD-10-CM | POA: Diagnosis not present

## 2020-07-15 DIAGNOSIS — K219 Gastro-esophageal reflux disease without esophagitis: Secondary | ICD-10-CM | POA: Diagnosis not present

## 2020-07-15 DIAGNOSIS — K648 Other hemorrhoids: Secondary | ICD-10-CM

## 2020-07-15 DIAGNOSIS — I10 Essential (primary) hypertension: Secondary | ICD-10-CM | POA: Diagnosis not present

## 2020-07-15 DIAGNOSIS — E860 Dehydration: Secondary | ICD-10-CM | POA: Diagnosis not present

## 2020-07-15 DIAGNOSIS — M109 Gout, unspecified: Secondary | ICD-10-CM | POA: Diagnosis not present

## 2020-07-15 DIAGNOSIS — I2510 Atherosclerotic heart disease of native coronary artery without angina pectoris: Secondary | ICD-10-CM | POA: Diagnosis not present

## 2020-07-15 DIAGNOSIS — E039 Hypothyroidism, unspecified: Secondary | ICD-10-CM | POA: Diagnosis not present

## 2020-07-15 DIAGNOSIS — E222 Syndrome of inappropriate secretion of antidiuretic hormone: Secondary | ICD-10-CM | POA: Diagnosis not present

## 2020-07-15 MED ORDER — OMEPRAZOLE 40 MG PO CPDR: 40.0000 mg | DELAYED_RELEASE_CAPSULE | Freq: Every day | ORAL | 5 refills | Status: DC

## 2020-07-15 NOTE — Patient Outreach (Signed)
[facility]) Care Management  07/15/2020  Arthur Romero 1941-02-12 [id number]   EMMI- General Discharge RED ON EMMI ALERT Day # 1 Date: 07/12/20 Red Alert Reason:  Other questions/problems? Yes    Outreach attempt: Telephone call to patient for EMMI follow up.  Patient reports he is doing good.  Discussed red flag.  Patient denies any other questions or problems.  Patient noted to have physician follow up today. Patient also acknowledges appointment and denies any problems with appointment.  Patient states he has all his medications and no questions about medications.  Discussed [facility] services and further follow up. Patient declines at this time.    Plan: RN CM will close case and send letter.    Jone Baseman, RN, MSN [facility] Care Management Care Management Coordinator Direct Line [phone] Toll Free: [phone]  Fax: [phone]

## 2020-07-15 NOTE — Progress Notes (Signed)
Subjective:    Patient ID: Arthur Romero, male    DOB: 09-13-1941, 79 y.o.   MRN: [id number]  Arthur Romero is a 79 y.o. male presenting on 07/15/2020 for Hospitalization Follow-up (Lower GI bleed--feeling weak)   HPI  HOSPITAL FOLLOW-UP VISIT  Hospital/Location: [facility] Date of Admission: 07/06/20 Date of Discharge: 07/10/20 Transitions of care telephone call: completed on Kellie Simmering LPN on 02/02/80  Reason for Admission: GI [facility] H&P and Discharge Summary have been reviewed - Patient presents today 5 days recent hospitalization. Brief summary of recent course, patient had symptoms of weakness rectal bleeding, hospitalized, treated with GI consult and EGD / Colonoscopy. Weakness thought due to anemia  Discontinued Aspirin during hospital. He was also on Xarelto. His next apt with Cardiology is on 07/17/20, regarding AFib they are asking about aspirin in future. While on xarelto His PPI medicine was increased from Omeprazole 20 up to 40mg  - new rx was sent.  He has had no further bleeding from hemorrhoids He takes Miralax 1 dose twice a day - every day now with better results for constipation.  Regarding, Partially herniated Nissen Fundoplication - identified on EGD In hospital Surgery was consulted, no intervention needed  - Today reports overall has done well after discharge. Symptoms of weakness and fatigue still but working with Home Health now. He is complaining of sedentary in hospital could not walk around much, he has walker, working on improving strength  I have reviewed the discharge medication list, and have reconciled the current and discharge medications today.   Current Outpatient Medications:  .  acetaminophen (TYLENOL) 500 MG tablet, Take 500-1,000 mg by mouth every 6 (six) hours as needed for mild pain or fever. , Disp: , Rfl:  .  allopurinol (ZYLOPRIM) 300 MG tablet, Take 300 mg by mouth daily., Disp: , Rfl:  .  amLODipine (NORVASC) 10 MG  tablet, Take 10 mg by mouth daily., Disp: , Rfl:  .  atorvastatin (LIPITOR) 40 MG tablet, Take 1 tablet (40 mg total) by mouth daily. (Patient taking differently: Take 40 mg by mouth every evening. ), Disp: 90 tablet, Rfl: 1 .  diltiazem (CARDIZEM CD) 180 MG 24 hr capsule, Take 1 capsule (180 mg total) by mouth daily., Disp: 30 capsule, Rfl: 2 .  gabapentin (NEURONTIN) 100 MG capsule, Start 1 capsule daily at bed time, increase by 1 cap every 2-3 days as tolerated up to take 3 at once in evening. (Patient taking differently: Take 200 mg by mouth at bedtime. ), Disp: 270 capsule, Rfl: 1 .  levothyroxine (SYNTHROID) 88 MCG tablet, Take 1 tablet (88 mcg total) by mouth daily., Disp: 90 tablet, Rfl: 3 .  melatonin 3 MG TABS tablet, Take 3 mg by mouth at bedtime as needed (sleep)., Disp: , Rfl:  .  omeprazole (PRILOSEC) 40 MG capsule, Take 1 capsule (40 mg total) by mouth daily., Disp: 30 capsule, Rfl: 5 .  polyethylene glycol (MIRALAX / GLYCOLAX) 17 g packet, Take 17 g by mouth in the morning and at bedtime., Disp: , Rfl:  .  Rivaroxaban (XARELTO) 20 MG TABS tablet, Take 1 tablet (20 mg total) by mouth daily with supper., Disp: 30 tablet, Rfl: 2  ------------------------------------------------------------------------- Social History   Tobacco Use  . Smoking status: Never Smoker  . Smokeless tobacco: Never Used  Vaping Use  . Vaping Use: Never used  Substance Use Topics  . Alcohol use: Never    Alcohol/week: 1.0 standard drink  Types: 1 Glasses of wine per week  . Drug use: Never   PHQ9 SCORE ONLY 04/26/2020 03/15/2020  PHQ-9 Total Score 0 0     Review of Systems Per HPI unless specifically indicated above     Objective:    BP 125/63   Pulse 77   Temp (!) 97.1 F (36.2 C) (Temporal)   Resp 16   Ht 6\' 3"  (1.905 m)   Wt 169 lb (76.7 kg)   SpO2 99%   BMI 21.12 kg/m   Wt Readings from Last 3 Encounters:  07/15/20 169 lb (76.7 kg)  07/09/20 175 lb 0.7 oz (79.4 kg)  06/25/20 171  lb 9.6 oz (77.8 kg)    Physical Exam Vitals and nursing note reviewed.  Constitutional:      General: He is not in acute distress.    Appearance: He is well-developed. He is not diaphoretic.     Comments: Well-appearing thin elderly male, comfortable, cooperative  HENT:     Head: Normocephalic and atraumatic.  Eyes:     General:        Right eye: No discharge.        Left eye: No discharge.     Conjunctiva/sclera: Conjunctivae normal.  Neck:     Thyroid: No thyromegaly.  Cardiovascular:     Rate and Rhythm: Normal rate and regular rhythm.     Heart sounds: Normal heart sounds. No murmur heard.   Pulmonary:     Effort: Pulmonary effort is normal. No respiratory distress.     Breath sounds: Normal breath sounds. No wheezing or rales.  Musculoskeletal:        General: Normal range of motion.     Cervical back: Normal range of motion and neck supple.     Comments: Able to stand with assistance using walker and ambulate  Lymphadenopathy:     Cervical: No cervical adenopathy.  Skin:    General: Skin is warm and dry.     Findings: No erythema or rash.  Neurological:     Mental Status: He is alert and oriented to person, place, and time.  Psychiatric:        Behavior: Behavior normal.     Comments: Well groomed, good eye contact, normal speech and thoughts        Results for orders placed or performed during the hospital encounter of 07/06/20  SARS Coronavirus 2 by RT PCR (hospital order, performed in [place] hospital lab) Nasopharyngeal Nasopharyngeal Swab   Specimen: Nasopharyngeal Swab  Result Value Ref Range   SARS Coronavirus 2 NEGATIVE NEGATIVE  Comprehensive metabolic panel  Result Value Ref Range   Sodium 139 135 - 145 mmol/L   Potassium 3.9 3.5 - 5.1 mmol/L   Chloride 101 98 - 111 mmol/L   CO2 28 22 - 32 mmol/L   Glucose, Bld 107 (H) 70 - 99 mg/dL   BUN 12 8 - 23 mg/dL   Creatinine, Ser 0.85 0.61 - 1.24 mg/dL   Calcium 9.2 8.9 - 10.3 mg/dL   Total  Protein 7.6 6.5 - 8.1 g/dL   Albumin 4.1 3.5 - 5.0 g/dL   AST 17 15 - 41 U/L   ALT 18 0 - 44 U/L   Alkaline Phosphatase 99 38 - 126 U/L   Total Bilirubin 0.7 0.3 - 1.2 mg/dL   GFR calc non Af Amer >60 >60 mL/min   GFR calc Af Amer >60 >60 mL/min   Anion gap 10 5 - 15  CBC  Result Value Ref Range   WBC 11.1 (H) 4.0 - 10.5 K/uL   RBC 4.38 4.22 - 5.81 MIL/uL   Hemoglobin 14.0 13.0 - 17.0 g/dL   HCT 41.1 39 - 52 %   MCV 93.8 80.0 - 100.0 fL   MCH 32.0 26.0 - 34.0 pg   MCHC 34.1 30.0 - 36.0 g/dL   RDW 14.3 11.5 - 15.5 %   Platelets 271 150 - 400 K/uL   nRBC 0.0 0.0 - 0.2 %  CBC with Differential  Result Value Ref Range   WBC 13.9 (H) 4.0 - 10.5 K/uL   RBC 4.11 (L) 4.22 - 5.81 MIL/uL   Hemoglobin 13.1 13.0 - 17.0 g/dL   HCT 38.4 (L) 39 - 52 %   MCV 93.4 80.0 - 100.0 fL   MCH 31.9 26.0 - 34.0 pg   MCHC 34.1 30.0 - 36.0 g/dL   RDW 14.2 11.5 - 15.5 %   Platelets 260 150 - 400 K/uL   nRBC 0.0 0.0 - 0.2 %   Neutrophils Relative % 81 %   Neutro Abs 11.2 (H) 1.7 - 7.7 K/uL   Lymphocytes Relative 12 %   Lymphs Abs 1.6 0.7 - 4.0 K/uL   Monocytes Relative 6 %   Monocytes Absolute 0.9 0 - 1 K/uL   Eosinophils Relative 1 %   Eosinophils Absolute 0.1 0 - 0 K/uL   Basophils Relative 0 %   Basophils Absolute 0.0 0 - 0 K/uL   Immature Granulocytes 0 %   Abs Immature Granulocytes 0.06 0.00 - 0.07 K/uL  Basic metabolic panel  Result Value Ref Range   Sodium 141 135 - 145 mmol/L   Potassium 4.0 3.5 - 5.1 mmol/L   Chloride 105 98 - 111 mmol/L   CO2 28 22 - 32 mmol/L   Glucose, Bld 83 70 - 99 mg/dL   BUN 13 8 - 23 mg/dL   Creatinine, Ser 0.84 0.61 - 1.24 mg/dL   Calcium 9.0 8.9 - 10.3 mg/dL   GFR calc non Af Amer >60 >60 mL/min   GFR calc Af Amer >60 >60 mL/min   Anion gap 8 5 - 15  CBC  Result Value Ref Range   WBC 13.4 (H) 4.0 - 10.5 K/uL   RBC 4.00 (L) 4.22 - 5.81 MIL/uL   Hemoglobin 12.8 (L) 13.0 - 17.0 g/dL   HCT 37.8 (L) 39 - 52 %   MCV 94.5 80.0 - 100.0 fL   MCH 32.0 26.0  - 34.0 pg   MCHC 33.9 30.0 - 36.0 g/dL   RDW 14.4 11.5 - 15.5 %   Platelets 279 150 - 400 K/uL   nRBC 0.0 0.0 - 0.2 %  CBC with Differential/Platelet  Result Value Ref Range   WBC 12.6 (H) 4.0 - 10.5 K/uL   RBC 4.26 4.22 - 5.81 MIL/uL   Hemoglobin 13.5 13.0 - 17.0 g/dL   HCT 38.5 (L) 39 - 52 %   MCV 90.4 80.0 - 100.0 fL   MCH 31.7 26.0 - 34.0 pg   MCHC 35.1 30.0 - 36.0 g/dL   RDW 14.4 11.5 - 15.5 %   Platelets 297 150 - 400 K/uL   nRBC 0.0 0.0 - 0.2 %   Neutrophils Relative % 71 %   Neutro Abs 9.1 (H) 1.7 - 7.7 K/uL   Lymphocytes Relative 16 %   Lymphs Abs 2.0 0.7 - 4.0 K/uL   Monocytes Relative 8 %   Monocytes Absolute 1.0 0 -  1 K/uL   Eosinophils Relative 3 %   Eosinophils Absolute 0.4 0 - 0 K/uL   Basophils Relative 1 %   Basophils Absolute 0.1 0 - 0 K/uL   Immature Granulocytes 1 %   Abs Immature Granulocytes 0.07 0.00 - 0.07 K/uL  Comprehensive metabolic panel  Result Value Ref Range   Sodium 140 135 - 145 mmol/L   Potassium 3.7 3.5 - 5.1 mmol/L   Chloride 102 98 - 111 mmol/L   CO2 26 22 - 32 mmol/L   Glucose, Bld 97 70 - 99 mg/dL   BUN 12 8 - 23 mg/dL   Creatinine, Ser 0.83 0.61 - 1.24 mg/dL   Calcium 8.9 8.9 - 10.3 mg/dL   Total Protein 6.8 6.5 - 8.1 g/dL   Albumin 3.6 3.5 - 5.0 g/dL   AST 14 (L) 15 - 41 U/L   ALT 16 0 - 44 U/L   Alkaline Phosphatase 91 38 - 126 U/L   Total Bilirubin 0.7 0.3 - 1.2 mg/dL   GFR calc non Af Amer >60 >60 mL/min   GFR calc Af Amer >60 >60 mL/min   Anion gap 12 5 - 15  Phosphorus  Result Value Ref Range   Phosphorus 4.1 2.5 - 4.6 mg/dL  Magnesium  Result Value Ref Range   Magnesium 2.0 1.7 - 2.4 mg/dL  Glucose, capillary  Result Value Ref Range   Glucose-Capillary 93 70 - 99 mg/dL   Comment 1 Notify RN    Comment 2 Document in Chart   CBC with Differential/Platelet  Result Value Ref Range   WBC 11.4 (H) 4.0 - 10.5 K/uL   RBC 4.33 4.22 - 5.81 MIL/uL   Hemoglobin 13.8 13.0 - 17.0 g/dL   HCT 40.4 39 - 52 %   MCV 93.3  80.0 - 100.0 fL   MCH 31.9 26.0 - 34.0 pg   MCHC 34.2 30.0 - 36.0 g/dL   RDW 14.2 11.5 - 15.5 %   Platelets 308 150 - 400 K/uL   nRBC 0.0 0.0 - 0.2 %   Neutrophils Relative % 74 %   Neutro Abs 8.4 (H) 1.7 - 7.7 K/uL   Lymphocytes Relative 14 %   Lymphs Abs 1.6 0.7 - 4.0 K/uL   Monocytes Relative 7 %   Monocytes Absolute 0.9 0 - 1 K/uL   Eosinophils Relative 4 %   Eosinophils Absolute 0.4 0 - 0 K/uL   Basophils Relative 0 %   Basophils Absolute 0.1 0 - 0 K/uL   Immature Granulocytes 1 %   Abs Immature Granulocytes 0.06 0.00 - 0.07 K/uL  Comprehensive metabolic panel  Result Value Ref Range   Sodium 141 135 - 145 mmol/L   Potassium 3.9 3.5 - 5.1 mmol/L   Chloride 105 98 - 111 mmol/L   CO2 26 22 - 32 mmol/L   Glucose, Bld 95 70 - 99 mg/dL   BUN 8 8 - 23 mg/dL   Creatinine, Ser 0.86 0.61 - 1.24 mg/dL   Calcium 9.1 8.9 - 10.3 mg/dL   Total Protein 6.8 6.5 - 8.1 g/dL   Albumin 3.8 3.5 - 5.0 g/dL   AST 15 15 - 41 U/L   ALT 15 0 - 44 U/L   Alkaline Phosphatase 88 38 - 126 U/L   Total Bilirubin 0.7 0.3 - 1.2 mg/dL   GFR calc non Af Amer >60 >60 mL/min   GFR calc Af Amer >60 >60 mL/min   Anion gap 10 5 - 15  Phosphorus  Result Value Ref Range   Phosphorus 3.6 2.5 - 4.6 mg/dL  Magnesium  Result Value Ref Range   Magnesium 1.9 1.7 - 2.4 mg/dL  Glucose, capillary  Result Value Ref Range   Glucose-Capillary 92 70 - 99 mg/dL   Comment 1 Notify RN   CBC with Differential/Platelet  Result Value Ref Range   WBC 11.2 (H) 4.0 - 10.5 K/uL   RBC 4.09 (L) 4.22 - 5.81 MIL/uL   Hemoglobin 13.1 13.0 - 17.0 g/dL   HCT 37.8 (L) 39 - 52 %   MCV 92.4 80.0 - 100.0 fL   MCH 32.0 26.0 - 34.0 pg   MCHC 34.7 30.0 - 36.0 g/dL   RDW 14.4 11.5 - 15.5 %   Platelets 292 150 - 400 K/uL   nRBC 0.0 0.0 - 0.2 %   Neutrophils Relative % 64 %   Neutro Abs 7.2 1.7 - 7.7 K/uL   Lymphocytes Relative 22 %   Lymphs Abs 2.5 0.7 - 4.0 K/uL   Monocytes Relative 8 %   Monocytes Absolute 0.9 0 - 1 K/uL    Eosinophils Relative 4 %   Eosinophils Absolute 0.5 0 - 0 K/uL   Basophils Relative 1 %   Basophils Absolute 0.1 0 - 0 K/uL   Immature Granulocytes 1 %   Abs Immature Granulocytes 0.07 0.00 - 0.07 K/uL  Comprehensive metabolic panel  Result Value Ref Range   Sodium 141 135 - 145 mmol/L   Potassium 4.2 3.5 - 5.1 mmol/L   Chloride 105 98 - 111 mmol/L   CO2 27 22 - 32 mmol/L   Glucose, Bld 90 70 - 99 mg/dL   BUN 10 8 - 23 mg/dL   Creatinine, Ser 0.97 0.61 - 1.24 mg/dL   Calcium 9.1 8.9 - 10.3 mg/dL   Total Protein 6.4 (L) 6.5 - 8.1 g/dL   Albumin 3.6 3.5 - 5.0 g/dL   AST 15 15 - 41 U/L   ALT 13 0 - 44 U/L   Alkaline Phosphatase 80 38 - 126 U/L   Total Bilirubin 0.7 0.3 - 1.2 mg/dL   GFR calc non Af Amer >60 >60 mL/min   GFR calc Af Amer >60 >60 mL/min   Anion gap 9 5 - 15  Phosphorus  Result Value Ref Range   Phosphorus 4.3 2.5 - 4.6 mg/dL  Magnesium  Result Value Ref Range   Magnesium 1.8 1.7 - 2.4 mg/dL  Glucose, capillary  Result Value Ref Range   Glucose-Capillary 88 70 - 99 mg/dL   Comment 1 Notify RN   Type and screen [place]  Result Value Ref Range   ABO/RH(D) O POS    Antibody Screen NEG    Sample Expiration      07/09/2020,2359 Performed at [facility], [address] [place],  [address]       Assessment & Plan:   Problem List Items Addressed This Visit    Persistent atrial fibrillation ([place])    Other Visit Diagnoses    Internal hemorrhoid, bleeding    -  Primary   Relevant Orders   CBC with Differential/Platelet   Gastroesophageal reflux disease without esophagitis       Relevant Medications   polyethylene glycol (MIRALAX / GLYCOLAX) 17 g packet   omeprazole (PRILOSEC) 40 MG capsule   Other Relevant Orders   BASIC METABOLIC PANEL WITH GFR     Lower GI Bleed, secondary to hemorrhoids (internal) GERD Generalized weakness -  deconditioning  Continue Home Health PT for improving generalized  weakness Anticipate some level deconditioning  Goal to improve PO intake nutrition, emphasis on meal supplement Boost trial  Order PPI Omeprazole 40mg  daily  Continue Miralax as recommended  Follow-up plan with due for repeat labs after hospitalization within 1-2 weeks, placed orders for BMET / CBC, draw within 1 week  Meds ordered this encounter  Medications  . omeprazole (PRILOSEC) 40 MG capsule    Sig: Take 1 capsule (40 mg total) by mouth daily.    Dispense:  30 capsule    Refill:  5    Added refills on file    Follow up plan: Return in about 2 weeks (around 07/29/2020).   [name], [facility] 07/15/2020, 11:18 AM

## 2020-07-15 NOTE — Patient Instructions (Addendum)
Thank you for coming to the office today.  Increase Omeprazole from 20 to 40mg  - new rx refilled  Keep on Miralax twice a day, instead of Senna  Limit sodium 1500-1800 daily  Remain off Aspirin, keep on xarelto, ask Cardiology on 9/22  Please schedule a Follow-up Appointment to: Return in about 2 weeks (around 07/29/2020).  If you have any other questions or concerns, please feel free to call the office or send a message through [place]. You may also schedule an earlier appointment if necessary.  Additionally, you may be receiving a survey about your experience at our office within a few days to 1 week by e-mail or mail. We value your feedback.  [name], DO [place]

## 2020-07-16 ENCOUNTER — Telehealth: Payer: Self-pay

## 2020-07-16 LAB — BASIC METABOLIC PANEL WITH GFR
BUN: 12 mg/dL (ref 7–25)
CO2: 31 mmol/L (ref 20–32)
Calcium: 9.6 mg/dL (ref 8.6–10.3)
Chloride: 102 mmol/L (ref 98–110)
Creat: 0.87 mg/dL (ref 0.70–1.18)
GFR, Est African American: 97 mL/min/{1.73_m2} (ref >=60)
GFR, Est Non African American: 84 mL/min/{1.73_m2} (ref >=60)
Glucose, Bld: 92 mg/dL (ref 65–99)
Potassium: 4 mmol/L (ref 3.5–5.3)
Sodium: 140 mmol/L (ref 135–146)

## 2020-07-16 LAB — CBC WITH DIFFERENTIAL/PLATELET
Absolute Monocytes: 1152 cells/uL — ABNORMAL HIGH (ref 200–950)
Basophils Absolute: 64 cells/uL (ref 0–200)
Basophils Relative: 0.5 %
Eosinophils Absolute: 154 cells/uL (ref 15–500)
Eosinophils Relative: 1.2 %
HCT: 40.9 % (ref 38.5–50.0)
Hemoglobin: 13.8 g/dL (ref 13.2–17.1)
Lymphs Abs: 1549 cells/uL (ref 850–3900)
MCH: 31.8 pg (ref 27.0–33.0)
MCHC: 33.7 g/dL (ref 32.0–36.0)
MCV: 94.2 fL (ref 80.0–100.0)
MPV: 9.3 fL (ref 7.5–12.5)
Monocytes Relative: 9 %
Neutro Abs: 9882 cells/uL — ABNORMAL HIGH (ref 1500–7800)
Neutrophils Relative %: 77.2 %
Platelets: 349 10*3/uL (ref 140–400)
RBC: 4.34 10*6/uL (ref 4.20–5.80)
RDW: 13.6 % (ref 11.0–15.0)
Total Lymphocyte: 12.1 %
WBC: 12.8 10*3/uL — ABNORMAL HIGH (ref 3.8–10.8)

## 2020-07-16 NOTE — Telephone Encounter (Signed)
Verbal given 

## 2020-07-16 NOTE — Telephone Encounter (Signed)
Copied from [place] [phone]. Topic: General - Other >> Jul 15, 2020  2:48 PM Oneta Rack wrote: Osvaldo Human name: Arthur Romero  Relation to pt: PT from [facility] Call back number: [phone]    Reason for call:  Requesting verbal orders 2x 3 and 1x 1

## 2020-07-17 ENCOUNTER — Other Ambulatory Visit: Payer: Self-pay

## 2020-07-17 ENCOUNTER — Encounter: Payer: Self-pay | Admitting: Cardiology

## 2020-07-17 ENCOUNTER — Ambulatory Visit (INDEPENDENT_AMBULATORY_CARE_PROVIDER_SITE_OTHER): Payer: Medicare PPO | Admitting: Cardiology

## 2020-07-17 VITALS — BP 118/64 | HR 82 | Ht 75.0 in | Wt 172.2 lb

## 2020-07-17 DIAGNOSIS — K922 Gastrointestinal hemorrhage, unspecified: Secondary | ICD-10-CM | POA: Diagnosis not present

## 2020-07-17 DIAGNOSIS — I480 Paroxysmal atrial fibrillation: Secondary | ICD-10-CM

## 2020-07-17 DIAGNOSIS — E222 Syndrome of inappropriate secretion of antidiuretic hormone: Secondary | ICD-10-CM | POA: Diagnosis not present

## 2020-07-17 DIAGNOSIS — I2510 Atherosclerotic heart disease of native coronary artery without angina pectoris: Secondary | ICD-10-CM | POA: Diagnosis not present

## 2020-07-17 DIAGNOSIS — M109 Gout, unspecified: Secondary | ICD-10-CM | POA: Diagnosis not present

## 2020-07-17 DIAGNOSIS — I10 Essential (primary) hypertension: Secondary | ICD-10-CM

## 2020-07-17 DIAGNOSIS — I4819 Other persistent atrial fibrillation: Secondary | ICD-10-CM | POA: Diagnosis not present

## 2020-07-17 DIAGNOSIS — E039 Hypothyroidism, unspecified: Secondary | ICD-10-CM | POA: Diagnosis not present

## 2020-07-17 DIAGNOSIS — E860 Dehydration: Secondary | ICD-10-CM | POA: Diagnosis not present

## 2020-07-17 DIAGNOSIS — K219 Gastro-esophageal reflux disease without esophagitis: Secondary | ICD-10-CM | POA: Diagnosis not present

## 2020-07-17 DIAGNOSIS — E440 Moderate protein-calorie malnutrition: Secondary | ICD-10-CM | POA: Diagnosis not present

## 2020-07-17 NOTE — Progress Notes (Signed)
Electrophysiology Office Note:    Date:  07/17/2020   ID:  Estes, Lehner 1941/05/19, MRN [id number]  PCP:  Olin Hauser, DO  [place] Cardiologist:  Ida Rogue, MD  [place] Electrophysiologist:  None   Referring MD: [name] *   Chief Complaint: AF  History of Present Illness:    Arthur Romero is a 79 y.o. male who presents for an evaluation of AF at the request of Dr Parks Ranger. Their medical history includes GERD, recent GI bleed, arthritis, HTN. He ws recently admitted for GI bleeding 9/11 through 9/15.  He is recovering from this hospitalization and has been tolerating the xarelto since discharge without further bleeding. His strength is improving. He is working with PT. Family is involved and is with him today. He does not feel palpitations as he has in the past.     Past Medical History:  Diagnosis Date   Arthritis    GERD (gastroesophageal reflux disease)    Hypertension    Rheumatoid arthritis ([place])    Thyroid disease     Past Surgical History:  Procedure Laterality Date   CARDIAC SURGERY     COLONOSCOPY N/A 07/09/2020   Procedure: COLONOSCOPY;  Surgeon: Jonathon Bellows, MD;  Location: [facility] ENDOSCOPY;  Service: Gastroenterology;  Laterality: N/A;   ESOPHAGOGASTRODUODENOSCOPY N/A 07/09/2020   Procedure: ESOPHAGOGASTRODUODENOSCOPY (EGD);  Surgeon: Jonathon Bellows, MD;  Location: [facility] ENDOSCOPY;  Service: Gastroenterology;  Laterality: N/A;   SHOULDER SURGERY Right 2016   TYMPANOPLASTY Right 1987   vocal cords  1989   polyps    Current Medications: Current Meds  Medication Sig   acetaminophen (TYLENOL) 500 MG tablet Take 500-1,000 mg by mouth every 6 (six) hours as needed for mild pain or fever.    allopurinol (ZYLOPRIM) 300 MG tablet Take 300 mg by mouth daily.   amLODipine (NORVASC) 10 MG tablet Take 10 mg by mouth daily.   atorvastatin (LIPITOR) 40 MG tablet Take 1 tablet (40 mg total) by mouth  daily.   diltiazem (CARDIZEM CD) 180 MG 24 hr capsule Take 1 capsule (180 mg total) by mouth daily.   gabapentin (NEURONTIN) 100 MG capsule Start 1 capsule daily at bed time, increase by 1 cap every 2-3 days as tolerated up to take 3 at once in evening.   levothyroxine (SYNTHROID) 88 MCG tablet Take 1 tablet (88 mcg total) by mouth daily.   melatonin 3 MG TABS tablet Take 3 mg by mouth at bedtime as needed (sleep).   omeprazole (PRILOSEC) 40 MG capsule Take 1 capsule (40 mg total) by mouth daily.   polyethylene glycol (MIRALAX / GLYCOLAX) 17 g packet Take 17 g by mouth in the morning and at bedtime.   Rivaroxaban (XARELTO) 20 MG TABS tablet Take 1 tablet (20 mg total) by mouth daily with supper.     Allergies:   Ace inhibitors and Prednisone   Social History   Socioeconomic History   Marital status: Single    Spouse name: Not on file   Number of children: Not on file   Years of education: high school   Highest education level: High school graduate  Occupational History   Not on file  Tobacco Use   Smoking status: Never Smoker   Smokeless tobacco: Never Used  Vaping Use   Vaping Use: Never used  Substance and Sexual Activity   Alcohol use: Never    Alcohol/week: 1.0 standard drink    Types: 1 Glasses of wine per week  Drug use: Never   Sexual activity: Not on file  Other Topics Concern   Not on file  Social History Narrative   Not on file   Social Determinants of Health   Financial Resource Strain:    Difficulty of Paying Living Expenses: Not on file  Food Insecurity:    Worried About [place] in the Last Year: Not on file   Ran Out of Food in the Last Year: Not on file  Transportation Needs:    Lack of Transportation (Medical): Not on file   Lack of Transportation (Non-Medical): Not on file  Physical Activity:    Days of Exercise per Week: Not on file   Minutes of Exercise per Session: Not on file  Stress:    Feeling of  Stress : Not on file  Social Connections:    Frequency of Communication with Friends and Family: Not on file   Frequency of Social Gatherings with Friends and Family: Not on file   Attends Religious Services: Not on file   Active Member of Clubs or Organizations: Not on file   Attends Archivist Meetings: Not on file   Marital Status: Not on file     Family History: The patient's family history includes Diabetes in his brother; Heart attack (age of onset: 67) in his father; Hypertension in his mother; Osteoarthritis in his mother; Parkinson's disease in his mother; Stroke in his brother; Tuberculosis in his father.  ROS:   Please see the history of present illness.    All other systems reviewed and are negative.  EKGs/Labs/Other Studies Reviewed:    The following studies were reviewed today: Echo  05/21/2020 Echo personally reviewed EF normal, 60% RV normal Atrial fibrillation   EKG:  The ekg ordered today demonstrates sinus rhythm.  Recent Labs: 05/19/2020: TSH 1.550 07/10/2020: ALT 13; Magnesium 1.8 07/15/2020: BUN 12; Creat 0.87; Hemoglobin 13.8; Platelets 349; Potassium 4.0; Sodium 140  Recent Lipid Panel No results found for: CHOL, TRIG, HDL, CHOLHDL, VLDL, LDLCALC, LDLDIRECT  Physical Exam:    VS:  BP 118/64    Pulse 82    Ht 6\' 3"  (1.905 m)    Wt 172 lb 3.2 oz (78.1 kg)    SpO2 98%    BMI 21.52 kg/m     Wt Readings from Last 3 Encounters:  07/17/20 172 lb 3.2 oz (78.1 kg)  07/15/20 169 lb (76.7 kg)  07/09/20 175 lb 0.7 oz (79.4 kg)     GEN:  no acute distress. In wheelchair. HEENT: Normal NECK: No JVD; No carotid bruits LYMPHATICS: No lymphadenopathy CARDIAC: RRR, no murmurs, rubs, gallops RESPIRATORY:  Clear to auscultation without rales, wheezing or rhonchi  ABDOMEN: Soft, non-tender, non-distended MUSCULOSKELETAL:  No edema; No deformity  SKIN: Warm and dry NEUROLOGIC:  Alert and oriented x 3 PSYCHIATRIC:  Normal affect   ASSESSMENT:      1. Paroxysmal atrial fibrillation (HCC)   2. Gastrointestinal hemorrhage, unspecified gastrointestinal hemorrhage type   3. Essential hypertension    PLAN:    In order of problems listed above:  1. Paroxysmal atrial fibrillation On xarelto for stroke ppx. Had a recent GI bleed but seems to be tolerating the xarelto now that he has stopped the aspirin. He is in normal rhythm today. I discussed the various treatment options available including continued stroke ppx and treatment with CCB as we are doing now vs antiarrhythmic therapy vs ablation. He is not interested in ablation or antiarrhythmic therapy given their  risk profile and prefers to continue with the current plan which I think is very reasonable. As long as he tolerates the xarelto, recommend continuing this medication. If has recurrent GI bleeding events, consider Watchman implant.   2. GI Bleed Now resolved. Continue xarelto and hold aspirin.  3. HTN Controlled today on the current regimen.    Medication Adjustments/Labs and Tests Ordered: Current medicines are reviewed at length with the patient today.  Concerns regarding medicines are outlined above.  Orders Placed This Encounter  Procedures   EKG 12-Lead   No orders of the defined types were placed in this encounter.    Signed, Lars Mage, MD, [facility]  07/17/2020 12:26 PM    Electrophysiology [place]

## 2020-07-17 NOTE — Patient Instructions (Signed)
Medication Instructions:  Your physician recommends that you continue on your current medications as directed. Please refer to the Current Medication list given to you today.  *If you need a refill on your cardiac medications before your next appointment, please call your pharmacy*   Lab Work: None ordered   Testing/Procedures: None ordered   Follow-Up: At [facility], you and your health needs are our priority.  As part of our continuing mission to provide you with exceptional heart care, we have created designated Provider Care Teams.  These Care Teams include your primary Cardiologist (physician) and Advanced Practice Providers (APPs -  Physician Assistants and Nurse Practitioners) who all work together to provide you with the care you need, when you need it.  We recommend signing up for the patient portal called "MyChart".  Sign up information is provided on this After Visit Summary.  MyChart is used to connect with patients for Virtual Visits (Telemedicine).  Patients are able to view lab/test results, encounter notes, upcoming appointments, etc.  Non-urgent messages can be sent to your provider as well.   To learn more about what you can do with MyChart, go to [email or web address].    Your next appointment:    As needed  The format for your next appointment:   In Person  Provider:   Lars Mage, MD

## 2020-07-18 DIAGNOSIS — E222 Syndrome of inappropriate secretion of antidiuretic hormone: Secondary | ICD-10-CM | POA: Diagnosis not present

## 2020-07-18 DIAGNOSIS — E039 Hypothyroidism, unspecified: Secondary | ICD-10-CM | POA: Diagnosis not present

## 2020-07-18 DIAGNOSIS — M109 Gout, unspecified: Secondary | ICD-10-CM | POA: Diagnosis not present

## 2020-07-18 DIAGNOSIS — K219 Gastro-esophageal reflux disease without esophagitis: Secondary | ICD-10-CM | POA: Diagnosis not present

## 2020-07-18 DIAGNOSIS — I2510 Atherosclerotic heart disease of native coronary artery without angina pectoris: Secondary | ICD-10-CM | POA: Diagnosis not present

## 2020-07-18 DIAGNOSIS — I4819 Other persistent atrial fibrillation: Secondary | ICD-10-CM | POA: Diagnosis not present

## 2020-07-18 DIAGNOSIS — E440 Moderate protein-calorie malnutrition: Secondary | ICD-10-CM | POA: Diagnosis not present

## 2020-07-18 DIAGNOSIS — I10 Essential (primary) hypertension: Secondary | ICD-10-CM | POA: Diagnosis not present

## 2020-07-18 DIAGNOSIS — E860 Dehydration: Secondary | ICD-10-CM | POA: Diagnosis not present

## 2020-07-22 DIAGNOSIS — R200 Anesthesia of skin: Secondary | ICD-10-CM | POA: Diagnosis not present

## 2020-07-22 DIAGNOSIS — K219 Gastro-esophageal reflux disease without esophagitis: Secondary | ICD-10-CM | POA: Diagnosis not present

## 2020-07-22 DIAGNOSIS — I2510 Atherosclerotic heart disease of native coronary artery without angina pectoris: Secondary | ICD-10-CM | POA: Diagnosis not present

## 2020-07-22 DIAGNOSIS — E860 Dehydration: Secondary | ICD-10-CM | POA: Diagnosis not present

## 2020-07-22 DIAGNOSIS — E440 Moderate protein-calorie malnutrition: Secondary | ICD-10-CM | POA: Diagnosis not present

## 2020-07-22 DIAGNOSIS — R202 Paresthesia of skin: Secondary | ICD-10-CM | POA: Diagnosis not present

## 2020-07-22 DIAGNOSIS — M109 Gout, unspecified: Secondary | ICD-10-CM | POA: Diagnosis not present

## 2020-07-22 DIAGNOSIS — E039 Hypothyroidism, unspecified: Secondary | ICD-10-CM | POA: Diagnosis not present

## 2020-07-22 DIAGNOSIS — I4819 Other persistent atrial fibrillation: Secondary | ICD-10-CM | POA: Diagnosis not present

## 2020-07-22 DIAGNOSIS — I10 Essential (primary) hypertension: Secondary | ICD-10-CM | POA: Diagnosis not present

## 2020-07-22 DIAGNOSIS — E222 Syndrome of inappropriate secretion of antidiuretic hormone: Secondary | ICD-10-CM | POA: Diagnosis not present

## 2020-07-23 DIAGNOSIS — E039 Hypothyroidism, unspecified: Secondary | ICD-10-CM | POA: Diagnosis not present

## 2020-07-23 DIAGNOSIS — I2510 Atherosclerotic heart disease of native coronary artery without angina pectoris: Secondary | ICD-10-CM | POA: Diagnosis not present

## 2020-07-23 DIAGNOSIS — M109 Gout, unspecified: Secondary | ICD-10-CM | POA: Diagnosis not present

## 2020-07-23 DIAGNOSIS — K219 Gastro-esophageal reflux disease without esophagitis: Secondary | ICD-10-CM | POA: Diagnosis not present

## 2020-07-23 DIAGNOSIS — I10 Essential (primary) hypertension: Secondary | ICD-10-CM | POA: Diagnosis not present

## 2020-07-23 DIAGNOSIS — E860 Dehydration: Secondary | ICD-10-CM | POA: Diagnosis not present

## 2020-07-23 DIAGNOSIS — E440 Moderate protein-calorie malnutrition: Secondary | ICD-10-CM | POA: Diagnosis not present

## 2020-07-23 DIAGNOSIS — I4819 Other persistent atrial fibrillation: Secondary | ICD-10-CM | POA: Diagnosis not present

## 2020-07-23 DIAGNOSIS — E222 Syndrome of inappropriate secretion of antidiuretic hormone: Secondary | ICD-10-CM | POA: Diagnosis not present

## 2020-07-25 DIAGNOSIS — I4819 Other persistent atrial fibrillation: Secondary | ICD-10-CM | POA: Diagnosis not present

## 2020-07-25 DIAGNOSIS — I10 Essential (primary) hypertension: Secondary | ICD-10-CM | POA: Diagnosis not present

## 2020-07-25 DIAGNOSIS — K219 Gastro-esophageal reflux disease without esophagitis: Secondary | ICD-10-CM | POA: Diagnosis not present

## 2020-07-25 DIAGNOSIS — E039 Hypothyroidism, unspecified: Secondary | ICD-10-CM | POA: Diagnosis not present

## 2020-07-25 DIAGNOSIS — M109 Gout, unspecified: Secondary | ICD-10-CM | POA: Diagnosis not present

## 2020-07-25 DIAGNOSIS — E440 Moderate protein-calorie malnutrition: Secondary | ICD-10-CM | POA: Diagnosis not present

## 2020-07-25 DIAGNOSIS — E222 Syndrome of inappropriate secretion of antidiuretic hormone: Secondary | ICD-10-CM | POA: Diagnosis not present

## 2020-07-25 DIAGNOSIS — E860 Dehydration: Secondary | ICD-10-CM | POA: Diagnosis not present

## 2020-07-25 DIAGNOSIS — I2510 Atherosclerotic heart disease of native coronary artery without angina pectoris: Secondary | ICD-10-CM | POA: Diagnosis not present

## 2020-07-29 ENCOUNTER — Ambulatory Visit (INDEPENDENT_AMBULATORY_CARE_PROVIDER_SITE_OTHER): Payer: Medicare PPO | Admitting: Family Medicine

## 2020-07-29 ENCOUNTER — Encounter: Payer: Self-pay | Admitting: Family Medicine

## 2020-07-29 ENCOUNTER — Other Ambulatory Visit: Payer: Self-pay

## 2020-07-29 VITALS — BP 112/68 | HR 68 | Temp 96.9°F | Resp 16 | Ht 75.0 in | Wt 172.0 lb

## 2020-07-29 DIAGNOSIS — B356 Tinea cruris: Secondary | ICD-10-CM | POA: Diagnosis not present

## 2020-07-29 DIAGNOSIS — I480 Paroxysmal atrial fibrillation: Secondary | ICD-10-CM

## 2020-07-29 DIAGNOSIS — R7309 Other abnormal glucose: Secondary | ICD-10-CM

## 2020-07-29 DIAGNOSIS — L89321 Pressure ulcer of left buttock, stage 1: Secondary | ICD-10-CM

## 2020-07-29 DIAGNOSIS — Z23 Encounter for immunization: Secondary | ICD-10-CM

## 2020-07-29 LAB — POCT GLYCOSYLATED HEMOGLOBIN (HGB A1C): Hemoglobin A1C: 5.6 % (ref 4.0–5.6)

## 2020-07-29 MED ORDER — KETOCONAZOLE 2 % EX CREA: 1.0000 "application " | TOPICAL_CREAM | Freq: Every day | CUTANEOUS | 1 refills | Status: DC

## 2020-07-29 NOTE — Progress Notes (Signed)
Subjective:    Patient ID: Arthur Romero, male    DOB: 19-Sep-1941, 79 y.o.   MRN: [id number]  Arthur Romero is a 79 y.o. male presenting on 07/29/2020 for Follow-up (last visit), Pre-Diabetes, and Atrial Fibrillation   HPI  Paroxysmal Atrial Fibrillation, anticoagulation on Xarelto History of GIB / hemorrhoids - hospitalized, was on both Xarelto and Aspirin, now off Aspirin and doing much better. He saw [facility] 07/17/20 and they discussed this and ultimately he decided against ablation and other aggressive treatment, and cardiology recommended to continue anticoagulation with Xarelto. He has opted to continue this and remain OFF aspirin, he has done better without bleeding significantly. - He continues home PT exercises, improving strength gradually, still fatigued - Admits sensation of feeling cold on xarelto  Pre-Diabetes Prior A1c 6.0, due today Meds: None Denies hypoglycemia, polyuria, visual changes, numbness or tingling.  Pressure Ulcer Stage 1 Left Gluteal Reports sore spot on left gluteal area, some redness, no drainage, no bleeding.  Tinea Cruris Reports heat rash in groin, persistent, used cortisone not helping as much.   Health Maintenance: Due for Flu Shot, will receive today     Depression screen [facility] 2/9 04/26/2020 03/15/2020  Decreased Interest 0 0  Down, Depressed, Hopeless 0 0  PHQ - 2 Score 0 0    Social History   Tobacco Use  . Smoking status: Never Smoker  . Smokeless tobacco: Never Used  Vaping Use  . Vaping Use: Never used  Substance Use Topics  . Alcohol use: Never    Alcohol/week: 1.0 standard drink    Types: 1 Glasses of wine per week  . Drug use: Never    Review of Systems Per HPI unless specifically indicated above     Objective:    BP 112/68   Pulse 68   Temp (!) 96.9 F (36.1 C) (Temporal)   Resp 16   Ht 6\' 3"  (1.905 m)   Wt 172 lb (78 kg)   SpO2 100%   BMI 21.50 kg/m   Wt Readings from Last 3 Encounters:    07/29/20 172 lb (78 kg)  07/17/20 172 lb 3.2 oz (78.1 kg)  07/15/20 169 lb (76.7 kg)    Physical Exam Vitals and nursing note reviewed.  Constitutional:      General: He is not in acute distress.    Appearance: He is well-developed. He is not diaphoretic.     Comments: Well-appearing thin elderly male, comfortable, cooperative  HENT:     Head: Normocephalic and atraumatic.  Eyes:     General:        Right eye: No discharge.        Left eye: No discharge.     Conjunctiva/sclera: Conjunctivae normal.  Neck:     Thyroid: No thyromegaly.  Cardiovascular:     Rate and Rhythm: Normal rate and regular rhythm.     Heart sounds: Normal heart sounds. No murmur heard.   Pulmonary:     Effort: Pulmonary effort is normal. No respiratory distress.     Breath sounds: Normal breath sounds. No wheezing or rales.  Musculoskeletal:        General: Normal range of motion.     Cervical back: Normal range of motion and neck supple.     Comments: Able to stand with assistance using walker and ambulate  Lymphadenopathy:     Cervical: No cervical adenopathy.  Skin:    General: Skin is warm and dry.  Findings: Rash present. No erythema.     Comments: Left gluteal area with mild superficial redness with slight induration without sign of cellulitis, most consistent with stage 1 or superficial pressure sore no opening or ulceration or drainage  Groin area with tinea cruris appearing rash bilateral  Neurological:     Mental Status: He is alert and oriented to person, place, and time.  Psychiatric:        Behavior: Behavior normal.     Comments: Well groomed, good eye contact, normal speech and thoughts    Recent Labs    04/26/20 1006 07/29/20 1302  HGBA1C 6.0* 5.6     Results for orders placed or performed in visit on 29/47/65  BASIC METABOLIC PANEL WITH GFR  Result Value Ref Range   Glucose, Bld 92 65 - 99 mg/dL   BUN 12 7 - 25 mg/dL   Creat 0.87 0.70 - 1.18 mg/dL   GFR, Est Non  African American 84 > OR = 60 mL/min/1.63m2   GFR, Est African American 97 > OR = 60 mL/min/1.72m2   BUN/Creatinine Ratio NOT APPLICABLE 6 - 22 (calc)   Sodium 140 135 - 146 mmol/L   Potassium 4.0 3.5 - 5.3 mmol/L   Chloride 102 98 - 110 mmol/L   CO2 31 20 - 32 mmol/L   Calcium 9.6 8.6 - 10.3 mg/dL  CBC with Differential/Platelet  Result Value Ref Range   WBC 12.8 (H) 3.8 - 10.8 Thousand/uL   RBC 4.34 4.20 - 5.80 Million/uL   Hemoglobin 13.8 13.2 - 17.1 g/dL   HCT 40.9 38 - 50 %   MCV 94.2 80.0 - 100.0 fL   MCH 31.8 27.0 - 33.0 pg   MCHC 33.7 32.0 - 36.0 g/dL   RDW 13.6 11.0 - 15.0 %   Platelets 349 140 - 400 Thousand/uL   MPV 9.3 7.5 - 12.5 fL   Neutro Abs 9,882 (H) 1,500 - 7,800 cells/uL   Lymphs Abs 1,549 850 - 3,900 cells/uL   Absolute Monocytes 1,152 (H) 200 - 950 cells/uL   Eosinophils Absolute 154 15 - 500 cells/uL   Basophils Absolute 64 0 - 200 cells/uL   Neutrophils Relative % 77.2 %   Total Lymphocyte 12.1 %   Monocytes Relative 9.0 %   Eosinophils Relative 1.2 %   Basophils Relative 0.5 %      Assessment & Plan:   Problem List Items Addressed This Visit    Paroxysmal atrial fibrillation (HCC)    Stable paroxysmal AFib without event recently On anticoagulation still with Xarelto only now, off Aspirin No further GIB or bleeding episodes since hospitalization Followed by Cardiology primarily Has had last apt and they recommend keep anticoag  Discussion again today about this, advised that I would support his cardiologist in keeping the xarelto only for stroke prevention and patient family agree      Elevated hemoglobin A1c - Primary    Today A1c 5.6 improved on lifestyle Last A1c 6.0 Encourage keep improving maintain diet      Relevant Orders   POCT glycosylated hemoglobin (Hb A1C) (Completed)    Other Visit Diagnoses    Needs flu shot       Relevant Orders   Flu Vaccine QUAD High Dose(Fluad) (Completed)   Tinea cruris       Relevant Medications    ketoconazole (NIZORAL) 2 % cream   Pressure injury of left buttock, stage 1         #Pressure sore L buttock  Stage 1, superficial only, no active ulceration Recommend offloading and use barrier cream to protect Future consider topical antibiotic, or sooner if develop ulceration or infection cellulitis  #Tinea Cruris On exam today confirmed Trial on topical rx Ketoconazole daily 2-4 weeks   Meds ordered this encounter  Medications  . ketoconazole (NIZORAL) 2 % cream    Sig: Apply 1 application topically daily. For up to 2-4 weeks or until healed. Apply to groin    Dispense:  15 g    Refill:  1      Follow up plan: Return in about 4 months (around 11/29/2020) for 4 month PreDM A1c, Paroxysmal AFib/Anticoag.  [name], DO [facility] 07/29/2020, 9:24 AM

## 2020-07-29 NOTE — Assessment & Plan Note (Signed)
Stable paroxysmal AFib without event recently On anticoagulation still with Xarelto only now, off Aspirin No further GIB or bleeding episodes since hospitalization Followed by Cardiology primarily Has had last apt and they recommend keep anticoag  Discussion again today about this, advised that I would support his cardiologist in keeping the xarelto only for stroke prevention and patient family agree 

## 2020-07-29 NOTE — Assessment & Plan Note (Signed)
Today A1c 5.6 improved on lifestyle Last A1c 6.0 Encourage keep improving maintain diet

## 2020-07-29 NOTE — Patient Instructions (Addendum)
Thank you for coming to the office today.  Flu shot today  Continue Xarelto to prevent stroke Unfortunately cold feeling is a side effect Reduced energy can be related as well  A1c today for sugar check Improve low carb low sugar diet.  Groin rash appears more fungal - use topical Ketoconazole cream once daily for 2-4 weeks or until healed.  Area of redness on bottom - appears to be an early pressure ulcer / bed sore, with a slight nodule within it, this does not seem to be bacterial infection. I would recommend using OTC BARRIER CREAM (Diaper Rash cream to protect and keep it dry) avoid prolong laying on this one spot, if it gets more red and spreads may need an antibiotic - let me know.  Please schedule a Follow-up Appointment to: Return in about 4 months (around 11/29/2020) for 4 month PreDM A1c, Paroxysmal AFib/Anticoag.  If you have any other questions or concerns, please feel free to call the office or send a message through [place]. You may also schedule an earlier appointment if necessary.  Additionally, you may be receiving a survey about your experience at our office within a few days to 1 week by e-mail or mail. We value your feedback.  [name], DO [place]

## 2020-07-30 DIAGNOSIS — E440 Moderate protein-calorie malnutrition: Secondary | ICD-10-CM | POA: Diagnosis not present

## 2020-07-30 DIAGNOSIS — E039 Hypothyroidism, unspecified: Secondary | ICD-10-CM | POA: Diagnosis not present

## 2020-07-30 DIAGNOSIS — K219 Gastro-esophageal reflux disease without esophagitis: Secondary | ICD-10-CM | POA: Diagnosis not present

## 2020-07-30 DIAGNOSIS — E222 Syndrome of inappropriate secretion of antidiuretic hormone: Secondary | ICD-10-CM | POA: Diagnosis not present

## 2020-07-30 DIAGNOSIS — M109 Gout, unspecified: Secondary | ICD-10-CM | POA: Diagnosis not present

## 2020-07-30 DIAGNOSIS — E860 Dehydration: Secondary | ICD-10-CM | POA: Diagnosis not present

## 2020-07-30 DIAGNOSIS — I4819 Other persistent atrial fibrillation: Secondary | ICD-10-CM | POA: Diagnosis not present

## 2020-07-30 DIAGNOSIS — I10 Essential (primary) hypertension: Secondary | ICD-10-CM | POA: Diagnosis not present

## 2020-07-30 DIAGNOSIS — I2510 Atherosclerotic heart disease of native coronary artery without angina pectoris: Secondary | ICD-10-CM | POA: Diagnosis not present

## 2020-08-01 DIAGNOSIS — E440 Moderate protein-calorie malnutrition: Secondary | ICD-10-CM | POA: Diagnosis not present

## 2020-08-01 DIAGNOSIS — I4819 Other persistent atrial fibrillation: Secondary | ICD-10-CM | POA: Diagnosis not present

## 2020-08-01 DIAGNOSIS — E039 Hypothyroidism, unspecified: Secondary | ICD-10-CM | POA: Diagnosis not present

## 2020-08-01 DIAGNOSIS — E860 Dehydration: Secondary | ICD-10-CM | POA: Diagnosis not present

## 2020-08-01 DIAGNOSIS — E222 Syndrome of inappropriate secretion of antidiuretic hormone: Secondary | ICD-10-CM | POA: Diagnosis not present

## 2020-08-01 DIAGNOSIS — M109 Gout, unspecified: Secondary | ICD-10-CM | POA: Diagnosis not present

## 2020-08-01 DIAGNOSIS — I2510 Atherosclerotic heart disease of native coronary artery without angina pectoris: Secondary | ICD-10-CM | POA: Diagnosis not present

## 2020-08-01 DIAGNOSIS — I10 Essential (primary) hypertension: Secondary | ICD-10-CM | POA: Diagnosis not present

## 2020-08-01 DIAGNOSIS — K219 Gastro-esophageal reflux disease without esophagitis: Secondary | ICD-10-CM | POA: Diagnosis not present

## 2020-08-05 DIAGNOSIS — E222 Syndrome of inappropriate secretion of antidiuretic hormone: Secondary | ICD-10-CM | POA: Diagnosis not present

## 2020-08-05 DIAGNOSIS — K219 Gastro-esophageal reflux disease without esophagitis: Secondary | ICD-10-CM | POA: Diagnosis not present

## 2020-08-05 DIAGNOSIS — E039 Hypothyroidism, unspecified: Secondary | ICD-10-CM | POA: Diagnosis not present

## 2020-08-05 DIAGNOSIS — E860 Dehydration: Secondary | ICD-10-CM | POA: Diagnosis not present

## 2020-08-05 DIAGNOSIS — M109 Gout, unspecified: Secondary | ICD-10-CM | POA: Diagnosis not present

## 2020-08-05 DIAGNOSIS — I10 Essential (primary) hypertension: Secondary | ICD-10-CM | POA: Diagnosis not present

## 2020-08-05 DIAGNOSIS — I4819 Other persistent atrial fibrillation: Secondary | ICD-10-CM | POA: Diagnosis not present

## 2020-08-05 DIAGNOSIS — E440 Moderate protein-calorie malnutrition: Secondary | ICD-10-CM | POA: Diagnosis not present

## 2020-08-05 DIAGNOSIS — I2510 Atherosclerotic heart disease of native coronary artery without angina pectoris: Secondary | ICD-10-CM | POA: Diagnosis not present

## 2020-08-15 DIAGNOSIS — R2689 Other abnormalities of gait and mobility: Secondary | ICD-10-CM | POA: Diagnosis not present

## 2020-08-15 DIAGNOSIS — M545 Low back pain, unspecified: Secondary | ICD-10-CM | POA: Diagnosis not present

## 2020-08-15 DIAGNOSIS — R202 Paresthesia of skin: Secondary | ICD-10-CM | POA: Diagnosis not present

## 2020-08-15 DIAGNOSIS — G629 Polyneuropathy, unspecified: Secondary | ICD-10-CM | POA: Diagnosis not present

## 2020-08-15 DIAGNOSIS — R262 Difficulty in walking, not elsewhere classified: Secondary | ICD-10-CM | POA: Diagnosis not present

## 2020-08-15 DIAGNOSIS — G8929 Other chronic pain: Secondary | ICD-10-CM | POA: Diagnosis not present

## 2020-08-15 DIAGNOSIS — R200 Anesthesia of skin: Secondary | ICD-10-CM | POA: Diagnosis not present

## 2020-08-19 ENCOUNTER — Other Ambulatory Visit: Payer: Self-pay | Admitting: Family Medicine

## 2020-08-19 DIAGNOSIS — I10 Essential (primary) hypertension: Secondary | ICD-10-CM

## 2020-08-19 NOTE — Telephone Encounter (Signed)
Requested medication (s) are due for refill today:  Yes  Requested medication (s) are on the active medication list:  Yes  Future visit scheduled:  Yes  Last Refill: Historical Medication  Notes to clinic:  Historical Provider  Requested Prescriptions  Pending Prescriptions Disp Refills   amLODipine ([place]) 10 MG tablet [Pharmacy Med Name: AMLODIPINE BESYLATE 10 MG Tablet] 90 tablet     Sig: TAKE 1 TABLET EVERY DAY      Cardiovascular:  Calcium Channel Blockers Passed - 08/19/2020  1:40 PM      Passed - Last BP in normal range    BP Readings from Last 1 Encounters:  07/29/20 112/68          Passed - Valid encounter within last 6 months    Recent Outpatient Visits           3 weeks ago Elevated hemoglobin A1c   [place], DO   1 month ago Internal hemorrhoid, bleeding   [place], DO   1 month ago Essential hypertension   [place], DO   3 months ago Acute cystitis with hematuria   [place], DO   3 months ago Elevated hemoglobin A1c   [facility] [place], Devonne Doughty, DO       Future Appointments             In 3 months [place], Devonne Doughty, DO [facility], [facility]

## 2020-08-21 ENCOUNTER — Telehealth: Payer: Self-pay | Admitting: Family Medicine

## 2020-08-21 NOTE — Telephone Encounter (Signed)
Called patient back. He was taken off Amlodipine and switched to Diltiazem in 04/2020 hospitalization. However he remained on Amlodipine and it was carried over on his med list prior.  I denied last refill due to duplicate therapy.   I called him back to clarify he said has been taking both, he does feel a little dizzy light headed in AM at times. His BP has been controlled.  I advised to HOLD Amlodipine for now remain off of it, and KEEP Diltiazem for now and he can call back if needs anything else regarding BP  [name], [facility] 08/21/2020, 11:43 AM

## 2020-08-21 NOTE — Telephone Encounter (Signed)
Pt is calling and would like to know why amlodipine was denied. Pt takes diltiazem also. Please advise

## 2020-09-03 ENCOUNTER — Other Ambulatory Visit: Payer: Self-pay

## 2020-09-03 MED ORDER — ALLOPURINOL 300 MG PO TABS
300.0000 mg | ORAL_TABLET | Freq: Every day | ORAL | 1 refills | Status: DC
Start: 2020-09-03 — End: 2021-02-03

## 2020-09-20 ENCOUNTER — Other Ambulatory Visit: Payer: Self-pay | Admitting: Family Medicine

## 2020-09-20 DIAGNOSIS — K219 Gastro-esophageal reflux disease without esophagitis: Secondary | ICD-10-CM

## 2020-09-23 ENCOUNTER — Other Ambulatory Visit: Payer: Self-pay | Admitting: Family Medicine

## 2020-09-23 DIAGNOSIS — I4819 Other persistent atrial fibrillation: Secondary | ICD-10-CM

## 2020-09-30 DIAGNOSIS — R202 Paresthesia of skin: Secondary | ICD-10-CM | POA: Diagnosis not present

## 2020-09-30 DIAGNOSIS — R200 Anesthesia of skin: Secondary | ICD-10-CM | POA: Diagnosis not present

## 2020-09-30 DIAGNOSIS — R2689 Other abnormalities of gait and mobility: Secondary | ICD-10-CM | POA: Diagnosis not present

## 2020-09-30 DIAGNOSIS — R262 Difficulty in walking, not elsewhere classified: Secondary | ICD-10-CM | POA: Diagnosis not present

## 2020-09-30 DIAGNOSIS — G8929 Other chronic pain: Secondary | ICD-10-CM | POA: Diagnosis not present

## 2020-09-30 DIAGNOSIS — M545 Low back pain, unspecified: Secondary | ICD-10-CM | POA: Diagnosis not present

## 2020-10-01 ENCOUNTER — Other Ambulatory Visit: Payer: Self-pay | Admitting: Neurology

## 2020-10-01 ENCOUNTER — Other Ambulatory Visit (HOSPITAL_COMMUNITY): Payer: Self-pay | Admitting: Neurology

## 2020-10-01 DIAGNOSIS — G8929 Other chronic pain: Secondary | ICD-10-CM

## 2020-10-01 DIAGNOSIS — M545 Low back pain, unspecified: Secondary | ICD-10-CM

## 2020-10-07 ENCOUNTER — Other Ambulatory Visit: Payer: Self-pay | Admitting: Family Medicine

## 2020-10-07 DIAGNOSIS — I10 Essential (primary) hypertension: Secondary | ICD-10-CM

## 2020-10-07 DIAGNOSIS — I4819 Other persistent atrial fibrillation: Secondary | ICD-10-CM

## 2020-10-09 ENCOUNTER — Other Ambulatory Visit: Payer: Self-pay

## 2020-10-09 ENCOUNTER — Ambulatory Visit
Admission: RE | Admit: 2020-10-09 | Discharge: 2020-10-09 | Disposition: A | Discharge status: Home / Self Care | Payer: Medicare PPO | Source: Ambulatory Visit | Attending: Neurology | Admitting: Neurology

## 2020-10-09 DIAGNOSIS — M545 Low back pain, unspecified: Secondary | ICD-10-CM | POA: Diagnosis not present

## 2020-10-09 DIAGNOSIS — G8929 Other chronic pain: Secondary | ICD-10-CM | POA: Diagnosis not present

## 2020-10-09 IMAGING — MR MR LUMBAR SPINE W/O CM
4 of 5 series · 33 of 48 positions shown · non-contrast
Comparison: [DATE] and prior.

CLINICAL DATA: Lower back and left greater than right leg pain.

EXAM:
MRI LUMBAR SPINE WITHOUT CONTRAST
TECHNIQUE: Multiplanar, multisequence MR imaging of the lumbar spine was
performed. No intravenous contrast was administered.

[Series 5: T2 · sagittal · 4.0mm · 0.81mm/px · 8 of 17 slices shown (1 of 2)]
[im 1/17]
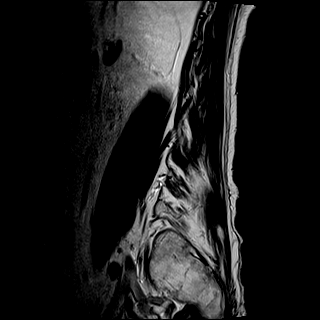
[im 3/17]
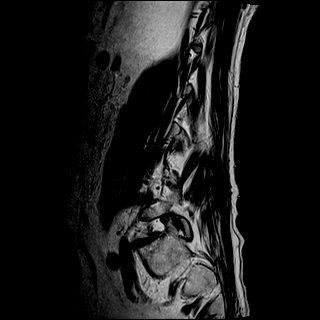
[im 5/17]
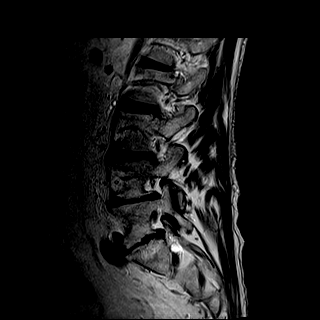
[im 7/17]
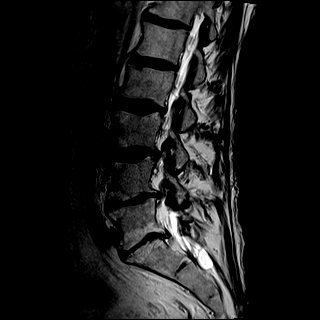
[im 10/17]
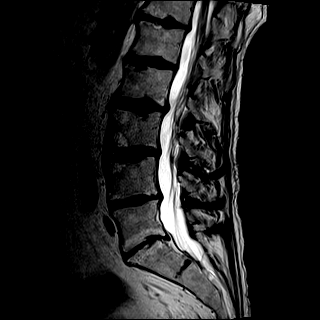
[im 12/17]
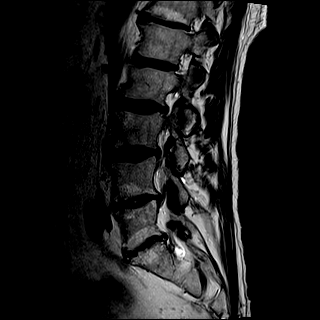
[im 14/17]
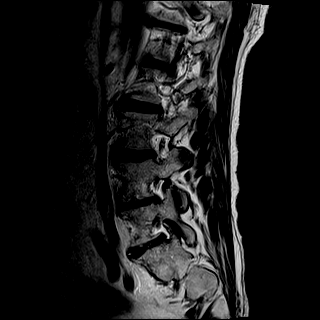
[im 17/17]
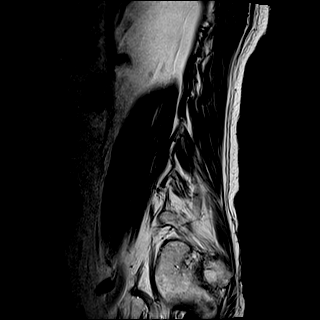

[Series 6: T1 · sagittal · 4.0mm · 0.81mm/px · 7 of 17 slices shown (1 of 2)]
[im 1/17]
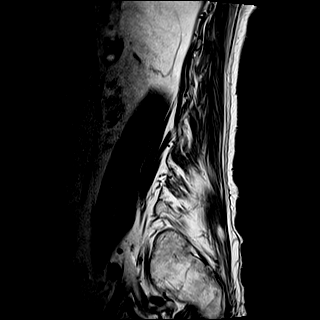
[im 3/17]
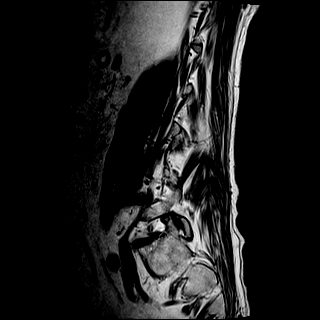
[im 6/17]
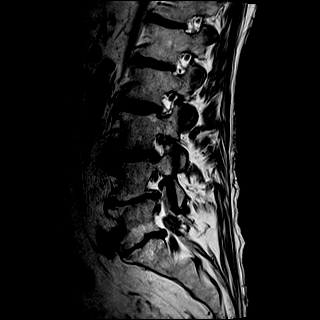
[im 9/17]
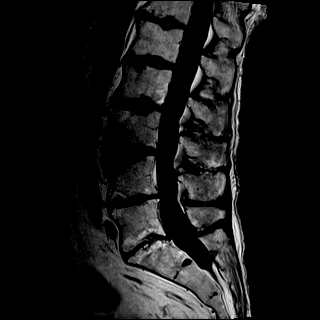
[im 11/17]
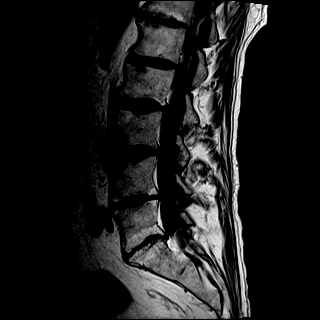
[im 14/17]
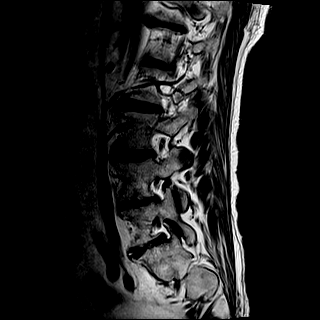
[im 17/17]
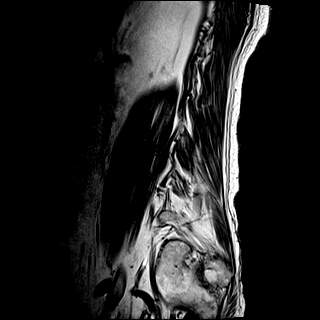

[Series 8: T2 · axial · 4.0mm · 0.78mm/px · z∈[-117,+113]mm · 9 of 32 slices shown (2 of 2)]
[im 1/32]
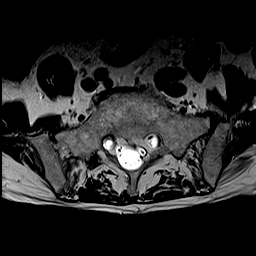
[im 6/32]
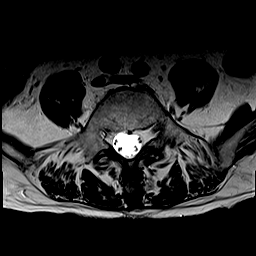
[im 11/32]
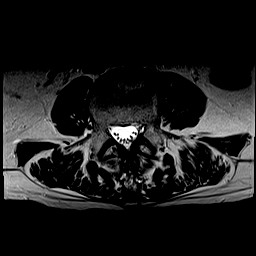
[im 13/32]
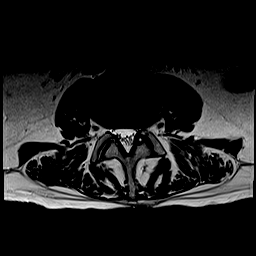
[im 16/32]
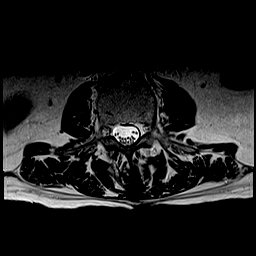
[im 19/32]
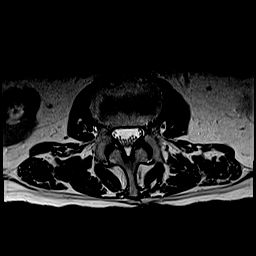
[im 21/32]
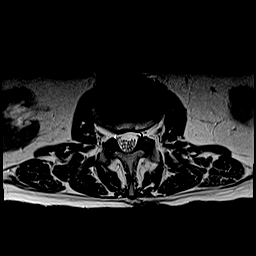
[im 26/32]
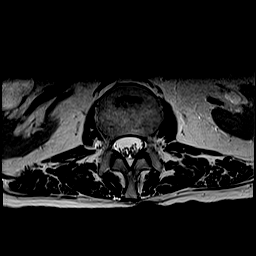
[im 32/32]
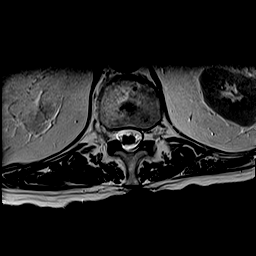

[Series 9: T1 · axial · 4.0mm · 0.39mm/px · z∈[-117,+113]mm · 9 of 32 slices shown (2 of 2)]
[im 1/32]
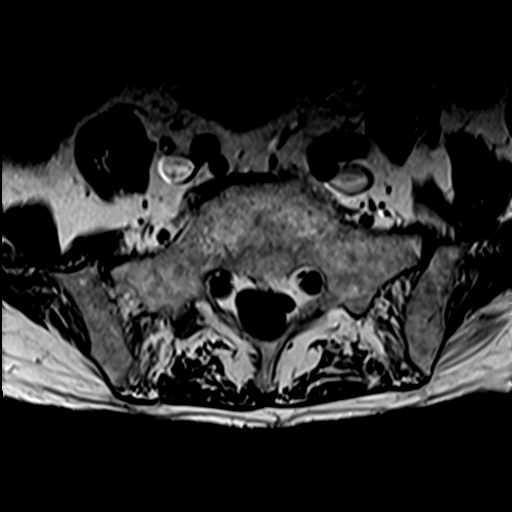
[im 6/32]
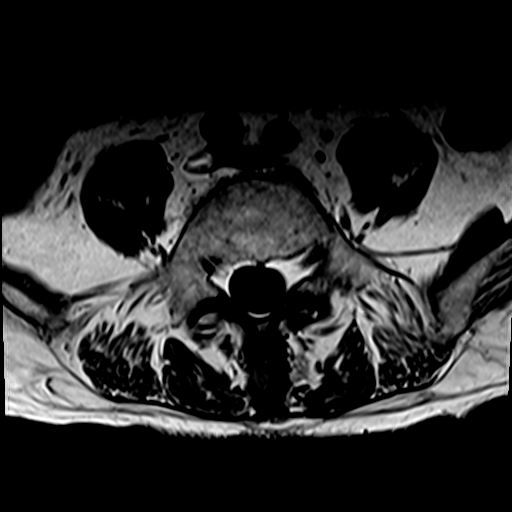
[im 11/32]
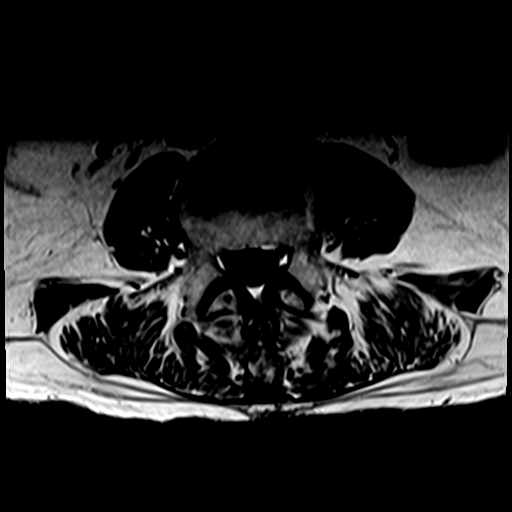
[im 13/32]
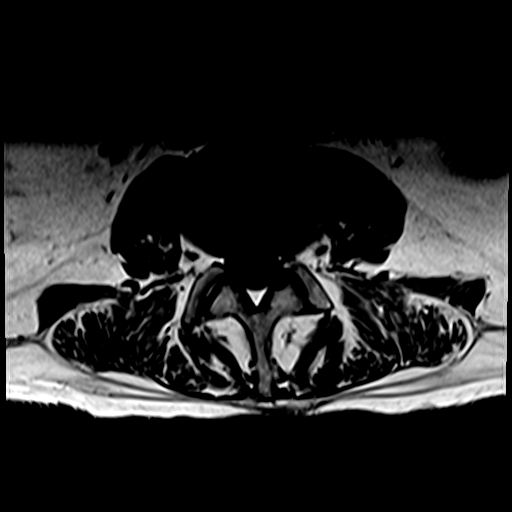
[im 16/32]
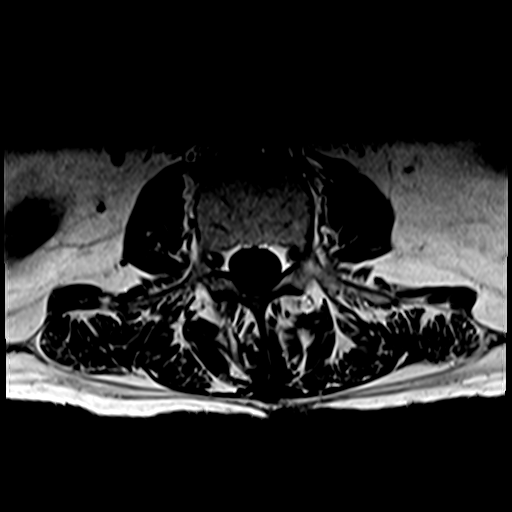
[im 19/32]
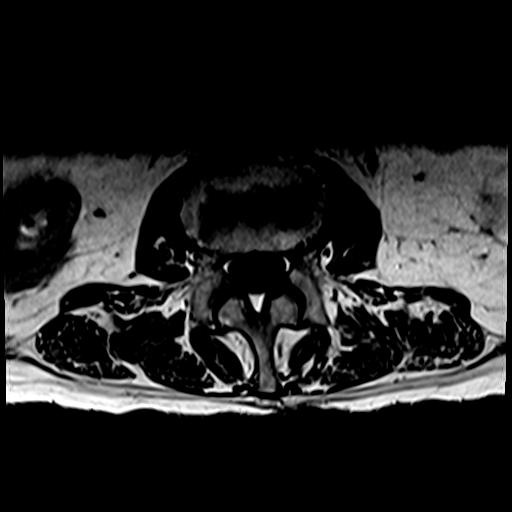
[im 21/32]
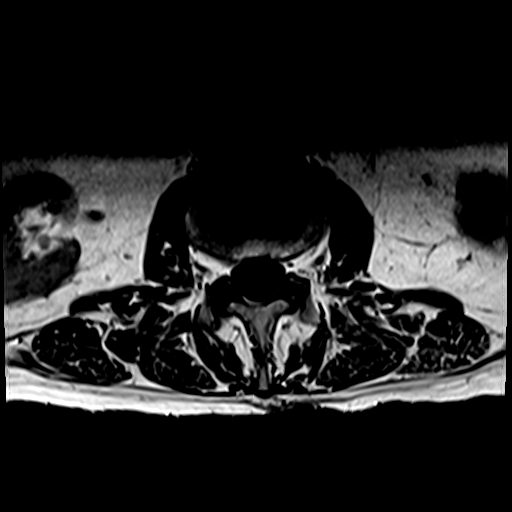
[im 26/32]
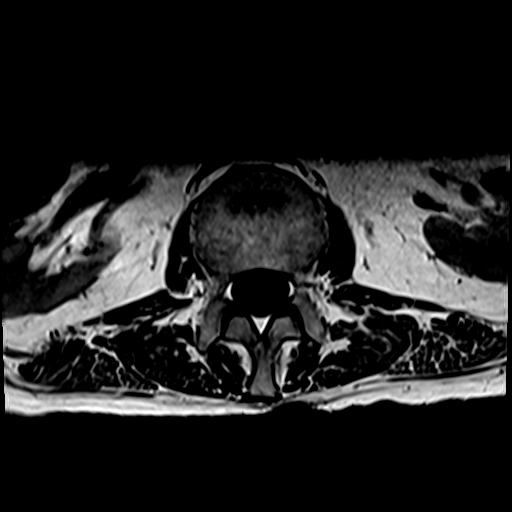
[im 32/32]
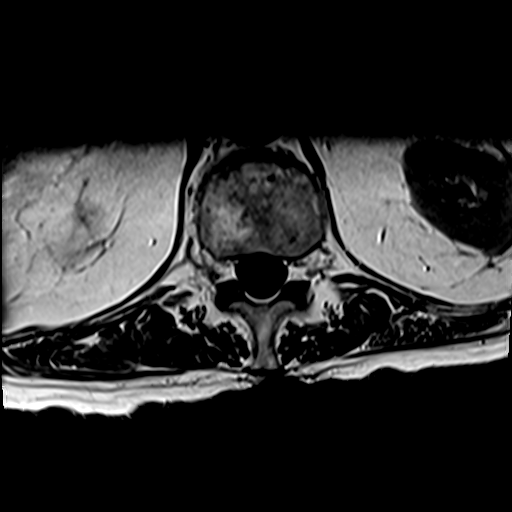

[33 of 48 positions shown; findings below may reference images not displayed]

FINDINGS: Segmentation:  Standard.

Alignment:  Minimal grade 1 L5-S1 retrolisthesis.

Vertebrae: Multilevel Modic type 2 and 1 endplate degenerative
changes. Vertebral body heights are preserved. No aggressive osseous
lesion.

Conus medullaris and cauda equina: Conus extends to the L2 level.
Conus and cauda equina appear normal.

Disc levels: Multilevel desiccation and disc space loss.

T12-L1: Mild disc bulge.  Patent spinal canal and neural foramen.

L1-2: Minimal disc bulge.  Patent spinal canal and neural foramen.

L2-3: Disc bulge and bilateral facet degenerative spurring. Minimal
spinal canal and mild bilateral neural foraminal narrowing.

L3-4: Disc bulge and bilateral facet degenerative spurring. Mild
spinal canal and bilateral neural foraminal narrowing.

L4-5: Disc bulge grazing the descending L5 nerve roots. Prominent
ligamentum flavum and facet degenerative spurring. Mild spinal canal
and bilateral neural foraminal narrowing.

L5-S1: Disc bulge with shallow central protrusion/annular fissuring.
Grazing of the bilateral exiting L5 and descending left S1 nerve
roots. Patent spinal canal. Mild bilateral neural foraminal
narrowing.

Paraspinal and other soft tissues: Negative.
IMPRESSION: Multilevel spondylosis. Grazing of the descending/exiting L5 and
descending left S1 nerve roots.

Mild spinal canal and bilateral neural foraminal narrowing at the
L2-3 levels.

Mild bilateral L5-S1 neural foraminal narrowing.

## 2020-10-30 ENCOUNTER — Other Ambulatory Visit: Payer: Self-pay | Admitting: Family Medicine

## 2020-10-30 DIAGNOSIS — M216X1 Other acquired deformities of right foot: Secondary | ICD-10-CM

## 2020-10-30 DIAGNOSIS — G589 Mononeuropathy, unspecified: Secondary | ICD-10-CM

## 2020-10-30 NOTE — Telephone Encounter (Signed)
Requested medication (s) are due for refill today: yes  Requested medication (s) are on the active medication list: {yes  Last refill:  04/17/20    Future visit scheduled: yes  Notes to clinic:  please update sig    Requested Prescriptions  Pending Prescriptions Disp Refills   gabapentin (NEURONTIN) 100 MG capsule [Pharmacy Med Name: GABAPENTIN 100 MG Capsule] 270 capsule 1    Sig: START 1 CAPSULE DAILY AT BEDTIME, INCREASE BY 1 CAP EVERY 2-3 DAYS AS TOLERATED UP TO 3 CAPSULES ONCE IN THE EVENING.      Neurology: Anticonvulsants - gabapentin Passed - 10/30/2020  2:48 PM      Passed - Valid encounter within last 12 months    Recent Outpatient Visits           3 months ago Elevated hemoglobin A1c   [facility] [place], Netta Neat, DO   3 months ago Internal hemorrhoid, bleeding   [facility] [place], Netta Neat, DO   4 months ago Essential hypertension   [facility] [place], Netta Neat, DO   5 months ago Acute cystitis with hematuria   [facility] [place], Netta Neat, DO   6 months ago Elevated hemoglobin A1c   [facility] [place], Netta Neat, DO       Future Appointments             In 1 month [place] Charon, Netta Neat, DO [facility], [facility]

## 2020-11-29 ENCOUNTER — Ambulatory Visit: Payer: Medicare PPO | Admitting: Family Medicine

## 2020-12-06 ENCOUNTER — Ambulatory Visit: Payer: Medicare PPO | Admitting: Family Medicine

## 2020-12-06 ENCOUNTER — Other Ambulatory Visit: Payer: Self-pay | Admitting: Family Medicine

## 2020-12-06 ENCOUNTER — Encounter: Payer: Self-pay | Admitting: Family Medicine

## 2020-12-06 ENCOUNTER — Other Ambulatory Visit: Payer: Self-pay

## 2020-12-06 VITALS — BP 125/67 | HR 65 | Ht 75.0 in | Wt 173.6 lb

## 2020-12-06 DIAGNOSIS — E782 Mixed hyperlipidemia: Secondary | ICD-10-CM | POA: Diagnosis not present

## 2020-12-06 DIAGNOSIS — I480 Paroxysmal atrial fibrillation: Secondary | ICD-10-CM

## 2020-12-06 DIAGNOSIS — E559 Vitamin D deficiency, unspecified: Secondary | ICD-10-CM

## 2020-12-06 DIAGNOSIS — K219 Gastro-esophageal reflux disease without esophagitis: Secondary | ICD-10-CM | POA: Diagnosis not present

## 2020-12-06 DIAGNOSIS — R7309 Other abnormal glucose: Secondary | ICD-10-CM

## 2020-12-06 DIAGNOSIS — I10 Essential (primary) hypertension: Secondary | ICD-10-CM

## 2020-12-06 DIAGNOSIS — I700 Atherosclerosis of aorta: Secondary | ICD-10-CM

## 2020-12-06 DIAGNOSIS — E538 Deficiency of other specified B group vitamins: Secondary | ICD-10-CM

## 2020-12-06 DIAGNOSIS — E039 Hypothyroidism, unspecified: Secondary | ICD-10-CM

## 2020-12-06 DIAGNOSIS — R351 Nocturia: Secondary | ICD-10-CM

## 2020-12-06 DIAGNOSIS — Z0000 Encounter for general adult medical examination without abnormal findings: Secondary | ICD-10-CM

## 2020-12-06 LAB — POCT GLYCOSYLATED HEMOGLOBIN (HGB A1C): Hemoglobin A1C: 6.1 % — AB (ref 4.0–5.6)

## 2020-12-06 MED ORDER — RIVAROXABAN 20 MG PO TABS: ORAL_TABLET | ORAL | 3 refills | Status: DC

## 2020-12-06 MED ORDER — OMEPRAZOLE 40 MG PO CPDR: 40.0000 mg | DELAYED_RELEASE_CAPSULE | Freq: Every day | ORAL | 3 refills | Status: DC

## 2020-12-06 MED ORDER — DILTIAZEM HCL ER COATED BEADS 180 MG PO CP24: 180.0000 mg | ORAL_CAPSULE | Freq: Every day | ORAL | 3 refills | Status: DC

## 2020-12-06 NOTE — Patient Instructions (Addendum)
Thank you for coming to the office today.  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  Recent Labs    04/26/20 1006 07/29/20 1302 12/06/20 1110  HGBA1C 6.0* 5.6 6.1*    Keep an eye on sugar and diet regimen to avoid excess sugars, starches.  Vitamin D3 2,000 once daily  DUE for FASTING BLOOD WORK (no food or drink after midnight before the lab appointment, only water or coffee without cream/sugar on the morning of)  SCHEDULE "Lab Only" visit in the morning at the clinic for lab draw in 6 MONTHS   - Make sure Lab Only appointment is at about 1 week before your next appointment, so that results will be available  For Lab Results, once available within 2-3 days of blood draw, you can can log in to MyChart online to view your results and a brief explanation. Also, we can discuss results at next follow-up visit.    Please schedule a Follow-up Appointment to: Return in about 6 months (around 06/05/2021) for 6 month fasting lab only then 1 week later Annual Physical.  If you have any other questions or concerns, please feel free to call the office or send a message through [place]. You may also schedule an earlier appointment if necessary.  Additionally, you may be receiving a survey about your experience at our office within a few days to 1 week by e-mail or mail. We value your feedback.  [name], DO [place]

## 2020-12-06 NOTE — Assessment & Plan Note (Signed)
Well-controlled HTN - Home BP readings reviewed  No known complications    Plan:  1. Continue current BP regimen - Diltiazem 180mg  daily 2. Encourage improved lifestyle - low sodium diet, regular exercise 3. Continue monitor BP outside office, bring readings to next visit, if persistently >140/90 or new symptoms notify office sooner

## 2020-12-06 NOTE — Addendum Note (Signed)
Addended by: Olin Hauser on: 12/06/2020 12:51 PM   Modules accepted: Orders

## 2020-12-06 NOTE — Progress Notes (Signed)
Subjective:    Patient ID: Arthur Romero, male    DOB: 08/06/1941, 80 y.o.   MRN: [id number]  Arthur Romero is a 80 y.o. male presenting on 12/06/2020 for 4 month follow up and Foot Pain (Left pinky toe hurts on the bottom. Joint hurts too.. wondering if it's possibly arthritis )   HPI   Left 5th Toe (Blister) ulceration Pain Pes [place] Reports new issue recently, has pain in toe and skin sore, not open or draining or bleeding. Used topical alcohol rub. Not used neosporin yet. Also had some pain in rest of foot forefoot. He has high arches not using arch support  Paroxysmal Atrial Fibrillation, anticoagulation on Xarelto History of GIB / hemorrhoids - hospitalized, was on both Xarelto and Aspirin, now off Aspirin and doing much better. He saw [facility] 07/17/20 and they discussed this and ultimately he decided against ablation and other aggressive treatment, and cardiology recommended to continue anticoagulation with Xarelto. He has opted to continue this and remain OFF aspirin, he has done better without bleeding significantly. - He continues home PT exercises, improving strength gradually, still fatigued - Admits sensation of feeling cold on xarelto still - He remains OFF Aspirin  Pre-Diabetes Prior A1c 6.0, due today. Meds: None Denies hypoglycemia, polyuria, visual changes, numbness or tingling.  CHRONIC HTN: Reports no new concerns. Current Meds - controlled on Diltiazem 180mg  daily - off Amlodipine   Reports good compliance, took meds today. Tolerating well, w/o complaints. Denies CP, dyspnea, HA, edema, dizziness / lightheadedness   Health Maintenance: UTD COVID Vaccine through 12/2019, needs booster, [place], [place]  Depression screen [facility] 2/9 04/26/2020 03/15/2020  Decreased Interest 0 0  Down, Depressed, Hopeless 0 0  PHQ - 2 Score 0 0    Social History   Tobacco Use  . Smoking status: Never Smoker  . Smokeless tobacco: Never Used  Vaping Use   . Vaping Use: Never used  Substance Use Topics  . Alcohol use: Never    Alcohol/week: 1.0 standard drink    Types: 1 Glasses of wine per week  . Drug use: Never    Review of Systems Per HPI unless specifically indicated above     Objective:    BP 125/67   Pulse 65   Ht 6\' 3"  (1.905 m)   Wt 173 lb 9.6 oz (78.7 kg)   SpO2 100%   BMI 21.70 kg/m   Wt Readings from Last 3 Encounters:  12/06/20 173 lb 9.6 oz (78.7 kg)  07/29/20 172 lb (78 kg)  07/17/20 172 lb 3.2 oz (78.1 kg)    Physical Exam Vitals and nursing note reviewed.  Constitutional:      General: He is not in acute distress.    Appearance: He is well-developed and well-nourished. He is not diaphoretic.     Comments: Well-appearing, comfortable, cooperative  HENT:     Head: Normocephalic and atraumatic.     Mouth/Throat:     Mouth: Oropharynx is clear and moist.  Eyes:     General:        Right eye: No discharge.        Left eye: No discharge.     Conjunctiva/sclera: Conjunctivae normal.  Cardiovascular:     Rate and Rhythm: Normal rate.  Pulmonary:     Effort: Pulmonary effort is normal.  Musculoskeletal:        General: No edema.  Skin:    General: Skin is warm and dry.  Findings: No erythema or rash.     Comments: Left dorsal 5th toe with superficial sore of skin at crease of toe, prior blister appears now has friable skin but not ulceration  Neurological:     Mental Status: He is alert and oriented to person, place, and time.  Psychiatric:        Mood and Affect: Mood and affect normal.        Behavior: Behavior normal.     Comments: Well groomed, good eye contact, normal speech and thoughts      Recent Labs    04/26/20 1006 07/29/20 1302 12/06/20 1110  HGBA1C 6.0* 5.6 6.1*    Results for orders placed or performed in visit on 12/06/20  POCT HgB A1C  Result Value Ref Range   Hemoglobin A1C 6.1 (A) 4.0 - 5.6 %      Assessment & Plan:   Problem List Items Addressed This Visit     Paroxysmal atrial fibrillation (HCC)    Stable paroxysmal AFib without event recently On anticoagulation still with Xarelto only now, off Aspirin No further GIB or bleeding episodes since hospitalization Followed by Cardiology primarily Has had last apt and they recommend keep anticoag  Discussion again today about this, advised that I would support his cardiologist in keeping the [place] only for stroke prevention and patient family agree      Relevant Medications   diltiazem (CARDIZEM CD) 180 MG 24 hr capsule   rivaroxaban (XARELTO) 20 MG TABS tablet   Mixed hyperlipidemia   Relevant Medications   diltiazem (CARDIZEM CD) 180 MG 24 hr capsule   rivaroxaban (XARELTO) 20 MG TABS tablet   Essential hypertension    Well-controlled HTN - Home BP readings reviewed  No known complications    Plan:  1. Continue current BP regimen - Diltiazem 180mg  daily 2. Encourage improved lifestyle - low sodium diet, regular exercise 3. Continue monitor BP outside office, bring readings to next visit, if persistently >140/90 or new symptoms notify office sooner      Relevant Medications   diltiazem (CARDIZEM CD) 180 MG 24 hr capsule   rivaroxaban (XARELTO) 20 MG TABS tablet   Elevated hemoglobin A1c - Primary    Today A1c up to 6.1, goal to improve lifestyle Encourage keep improving maintain diet      Relevant Orders   POCT HgB A1C (Completed)   Aortic atherosclerosis (HCC)    On Statin therapy      Relevant Medications   diltiazem (CARDIZEM CD) 180 MG 24 hr capsule   rivaroxaban (XARELTO) 20 MG TABS tablet    Other Visit Diagnoses    Gastroesophageal reflux 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 diltiazem (CARDIZEM CD) 180 MG 24 hr capsule    Sig: Take 1 capsule (180 mg total) by mouth daily.    Dispense:  90 capsule    Refill:  3  . rivaroxaban (XARELTO) 20 MG TABS tablet     Sig: Take 1 tablet (20 mg total) by mouth daily with supper.    Dispense:  90 tablet    Refill:  3  . omeprazole (PRILOSEC) 40 MG capsule    Sig: Take 1 capsule (40 mg total) by mouth daily.    Dispense:  90 capsule    Refill:  3    Added refills on file  Follow up plan: Return in about 6 months (around 06/05/2021) for 6 month fasting lab only then 1 week later Annual Physical.  Future labs ordered for 06/02/21 including Vitamin D (and b12), PSA    [name], DO [facility] 12/06/2020, 11:09 AM

## 2020-12-06 NOTE — Assessment & Plan Note (Signed)
On Statin therapy 

## 2020-12-06 NOTE — Assessment & Plan Note (Signed)
Today A1c up to 6.1, goal to improve lifestyle Encourage keep improving maintain diet

## 2020-12-06 NOTE — Assessment & Plan Note (Signed)
Stable paroxysmal AFib without event recently On anticoagulation still with Xarelto only now, off Aspirin No further GIB or bleeding episodes since hospitalization Followed by Cardiology primarily Has had last apt and they recommend keep anticoag  Discussion again today about this, advised that I would support his cardiologist in keeping the [place] only for stroke prevention and patient family agree

## 2020-12-23 DIAGNOSIS — R200 Anesthesia of skin: Secondary | ICD-10-CM | POA: Diagnosis not present

## 2020-12-23 DIAGNOSIS — G8929 Other chronic pain: Secondary | ICD-10-CM | POA: Diagnosis not present

## 2020-12-23 DIAGNOSIS — R2689 Other abnormalities of gait and mobility: Secondary | ICD-10-CM | POA: Diagnosis not present

## 2020-12-23 DIAGNOSIS — G629 Polyneuropathy, unspecified: Secondary | ICD-10-CM | POA: Diagnosis not present

## 2020-12-23 DIAGNOSIS — R202 Paresthesia of skin: Secondary | ICD-10-CM | POA: Diagnosis not present

## 2020-12-23 DIAGNOSIS — M545 Low back pain, unspecified: Secondary | ICD-10-CM | POA: Diagnosis not present

## 2020-12-23 DIAGNOSIS — R262 Difficulty in walking, not elsewhere classified: Secondary | ICD-10-CM | POA: Diagnosis not present

## 2021-01-10 ENCOUNTER — Ambulatory Visit: Payer: Self-pay | Admitting: *Deleted

## 2021-01-10 NOTE — Telephone Encounter (Signed)
Patient requesting information if he can take allergy medication , loratadine , with his current medications gabapetin and lyrica. Patient reports he has symptoms of a sinus headache and allergies since Tuesday. Denies fever, cough, sore throat. Patient reports he has been feeling dizzy and very nervous and "skiddish" and "weird". Reports his ears feel like a "frog pond" and has been waking up every morning with a slight headache. Patient reports he is concerned how he has been feeling since starting lyrica 2 weeks ago , prescribed by Arthur Romero neurology. Patient reports he continues to have numbness and tingling in bilateral feet due to neuropathy. Encouraged patient to contact pharmacy to review any possible interactions between medications. Also to contact MD that prescribed lyrica. Patient feels he is taking too much medication and no change n symptoms of neuropathy. appt scheduled for 01/13/21 with PCP to review medication regimen. Care advise given. Patient verbalized understanding of care advise and to call back or go to ED if symptoms worsen. Please advise   Reason for Disposition . [1] Caller has medicine question about med NOT prescribed by PCP AND [2] triager unable to answer question (e.g., compatibility with other med, storage)  Answer Assessment - Initial Assessment Questions 1. NAME of MEDICATION: "What medicine are you calling about?"     Loratidine, lyrica, gabapentin 2. QUESTION: "What is your question?" (e.g., medication refill, side effect)     Can I take an allergy medication with all of these other pills I'm on? 3. PRESCRIBING HCP: "Who prescribed it?" Reason: if prescribed by specialist, call should be referred to that group.     Gabapentin - Arthur Romero - Arthur Romero 4. SYMPTOMS: "Do you have any symptoms?"     Dizziness , feels nervous and "skiddish", feels weird and ears sound like a "frog pond" 5. SEVERITY: If symptoms are present, ask "Are they mild, moderate or severe?"      Mild  6. PREGNANCY:  "Is there any chance that you are pregnant?" "When was your last menstrual period?"     na  Protocols used: MEDICATION QUESTION CALL-A-AH

## 2021-01-10 NOTE — Telephone Encounter (Signed)
Pt wanted to know if he can combine his medications with over the counter meds for his sinus / headaches. Pt wanted to make sure there were no negative interactions; the pt says he "is dizzy headed and can't hardly go"; the pt says this started on Lyrica

## 2021-01-10 NOTE — Telephone Encounter (Signed)
Yes he can take Loratadine with current meds. 10mg  daily.  Apt we can answer other med questions  [name], [facility] 01/10/2021, 3:20 PM

## 2021-01-10 NOTE — Telephone Encounter (Signed)
The pt was notified of Dr. Raliegh Ip recommendation. He verbalize understanding.

## 2021-01-13 ENCOUNTER — Ambulatory Visit: Payer: Medicare PPO | Admitting: Family Medicine

## 2021-01-13 ENCOUNTER — Other Ambulatory Visit: Payer: Self-pay

## 2021-01-13 ENCOUNTER — Encounter: Payer: Self-pay | Admitting: Family Medicine

## 2021-01-13 VITALS — BP 107/63 | HR 72 | Ht 75.0 in | Wt 173.0 lb

## 2021-01-13 DIAGNOSIS — M48061 Spinal stenosis, lumbar region without neurogenic claudication: Secondary | ICD-10-CM | POA: Diagnosis not present

## 2021-01-13 DIAGNOSIS — T887XXA Unspecified adverse effect of drug or medicament, initial encounter: Secondary | ICD-10-CM

## 2021-01-13 DIAGNOSIS — M5136 Other intervertebral disc degeneration, lumbar region: Secondary | ICD-10-CM | POA: Diagnosis not present

## 2021-01-13 NOTE — Patient Instructions (Addendum)
Thank you for coming to the office today.  Taper Lyrica down to 25mg  once daily for 1 week, then stop it.  Please call Dr [name] after 1-2 weeks and let them know that you spoke with me today about this, and you have come off of Lyrica medication due to side effects, and that now you may need new treatment plan.  Please ask Dr [name] if you could consider a referral could be placed to physiatry for Spine or Back Injections  Vitamin D3 1,000 or 2,000 unit per day - the 5,000 is too much.  Continue Calcium daily.   Please schedule a Follow-up Appointment to: Return if symptoms worsen or fail to improve.  If you have any other questions or concerns, please feel free to call the office or send a message through [place]. You may also schedule an earlier appointment if necessary.  Additionally, you may be receiving a survey about your experience at our office within a few days to 1 week by e-mail or mail. We value your feedback.  [name], DO [place]

## 2021-01-13 NOTE — Progress Notes (Signed)
Subjective:    Patient ID: Arthur Romero, male    DOB: 1940-11-28, 80 y.o.   MRN: [id number]  Arthur Romero is a 80 y.o. 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 He asks about what other options He thinks he has side effect on Lyrica.  Upcoming apt 02/17/21 - Dr Arthur Romero Neuro   Depression screen [name] 2/9 04/26/2020 03/15/2020  Decreased Interest 0 0  Down, Depressed, Hopeless 0 0  PHQ - 2 Score 0 0    Social History   Tobacco Use  . Smoking status: Never Smoker  . Smokeless tobacco: Never Used  Vaping Use  . Vaping Use: Never used  Substance Use Topics  . Alcohol use: Never    Alcohol/week: 1.0 standard drink    Types: 1 Glasses of wine per week  . Drug use: Never    Review of Systems Per HPI unless specifically indicated above     Objective:    BP 107/63   Pulse 72   Ht 6\' 3"  (1.905 m)   Wt 173 lb (78.5 kg)   SpO2 100%   BMI 21.62 kg/m   Wt Readings from Last 3 Encounters:  01/13/21 173 lb (78.5 kg)  12/06/20 173 lb 9.6 oz (78.7 kg)  07/29/20 172 lb (78 kg)    Physical Exam Vitals and nursing note reviewed.  Constitutional:      General: He is not in acute distress.    Appearance: He is well-developed. He is not diaphoretic.     Comments: Well-appearing, comfortable, cooperative  HENT:     Head: Normocephalic and atraumatic.  Eyes:     General:        Right eye: No discharge.        Left eye: No discharge.     Conjunctiva/sclera: Conjunctivae  normal.  Cardiovascular:     Rate and Rhythm: Normal rate.  Pulmonary:     Effort: Pulmonary effort is normal.  Skin:    General: Skin is warm and dry.     Findings: No erythema or rash.  Neurological:     Mental Status: He is alert and oriented to person, place, and time.  Psychiatric:        Behavior: Behavior normal.     Comments: Well groomed, good eye contact, normal speech and thoughts      I have personally reviewed the radiology report from 10/09/20 MRI Lumbar.  CLINICAL DATA:  Lower back and left greater than right leg pain.  EXAM: MRI LUMBAR SPINE WITHOUT CONTRAST  TECHNIQUE: Multiplanar, multisequence MR imaging of the lumbar spine was performed. No intravenous contrast was administered.  COMPARISON:  07/06/2020 and prior.  FINDINGS: Segmentation:  Standard.  Alignment:  Minimal grade 1 L5-S1 retrolisthesis.  Vertebrae: Multilevel Modic type 2 and 1 endplate degenerative changes. Vertebral body heights are preserved. No aggressive osseous lesion.  Conus medullaris and cauda equina: Conus extends to the L2 level. Conus and cauda equina appear normal.  Disc levels: Multilevel desiccation and disc space loss.  T12-L1:  Mild disc bulge.  Patent spinal canal and neural foramen.  L1-2: Minimal disc bulge.  Patent spinal canal and neural foramen.  L2-3: Disc bulge and bilateral facet degenerative spurring. Minimal spinal canal and mild bilateral neural foraminal narrowing.  L3-4: Disc bulge and bilateral facet degenerative spurring. Mild spinal canal and bilateral neural foraminal narrowing.  L4-5: Disc bulge grazing the descending L5 nerve roots. Prominent ligamentum flavum and facet degenerative spurring. Mild spinal canal and bilateral neural foraminal narrowing.  L5-S1: Disc bulge with shallow central protrusion/annular fissuring. Grazing of the bilateral exiting L5 and descending left S1 nerve roots. Patent spinal canal. Mild bilateral  neural foraminal narrowing.  Paraspinal and other soft tissues: Negative.  IMPRESSION: Multilevel spondylosis. Grazing of the descending/exiting L5 and descending left S1 nerve roots.  Mild spinal canal and bilateral neural foraminal narrowing at the L2-3 levels.  Mild bilateral L5-S1 neural foraminal narrowing.   Electronically Signed   By: Arthur Romero M.D.   On: 10/09/2020 15:14  Results for orders placed or performed in visit on 12/06/20  POCT HgB A1C  Result Value Ref Range   Hemoglobin A1C 6.1 (A) 4.0 - 5.6 %      Assessment & Plan:   Problem List Items Addressed This Visit    Lumbar foraminal stenosis   DDD (degenerative disc disease), lumbar - Primary    Other Visit Diagnoses    Medication side effects          Clinically with low back pain / lumbar DDD with spinal stenosis and radicular symptoms with neuropathy Chronic issues, seems mostly unchanged today He is followed by Dr [name] Neuro  Today he seems to have side effect of lyrica based on his report since onset following new start of lyrica, gave him 1 week taper instructions from 25mg  BID down to 25mg  daily for 1 week then discontinue  He should notify Dr Arthur Romero that he has [place]  He can keep on Gabapentin  He should request next plan from Dr Arthur Romero to proceed with physiatry for spinal injections most likely option, would avoid surgical option at this time.  Discussed again with patient this underlying spinal issue is likely cause of his neuropathy in lower extremity.    No orders of the defined types were placed in this encounter.     Follow up plan: Return if symptoms worsen or fail to improve.   Arthur Romero, [name] 01/13/2021, 2:50 PM

## 2021-02-03 ENCOUNTER — Other Ambulatory Visit: Payer: Self-pay | Admitting: Family Medicine

## 2021-02-17 ENCOUNTER — Other Ambulatory Visit: Payer: Self-pay

## 2021-02-17 DIAGNOSIS — R2689 Other abnormalities of gait and mobility: Secondary | ICD-10-CM | POA: Diagnosis not present

## 2021-02-17 DIAGNOSIS — G629 Polyneuropathy, unspecified: Secondary | ICD-10-CM | POA: Diagnosis not present

## 2021-02-17 DIAGNOSIS — R200 Anesthesia of skin: Secondary | ICD-10-CM | POA: Diagnosis not present

## 2021-02-17 DIAGNOSIS — M545 Low back pain, unspecified: Secondary | ICD-10-CM | POA: Diagnosis not present

## 2021-02-17 DIAGNOSIS — R202 Paresthesia of skin: Secondary | ICD-10-CM | POA: Diagnosis not present

## 2021-02-17 DIAGNOSIS — G8929 Other chronic pain: Secondary | ICD-10-CM | POA: Diagnosis not present

## 2021-02-17 DIAGNOSIS — R262 Difficulty in walking, not elsewhere classified: Secondary | ICD-10-CM | POA: Diagnosis not present

## 2021-02-17 DIAGNOSIS — R937 Abnormal findings on diagnostic imaging of other parts of musculoskeletal system: Secondary | ICD-10-CM | POA: Diagnosis not present

## 2021-02-17 NOTE — Patient Outreach (Addendum)
[facility]) Care Management  02/17/2021  Arthur Romero 20-Jun-1941 [id number]   Telephone Screen  Referral Date: 02/17/2021 Referral Source: Nurse Line Call Center Referral Reason: "Caller informed me that he has ringing and buzzing in both of his ear for just over a week.The right ear is worse than the left.He said it is painful above the ear he thinks. I asked him if he had a ear injury. He said that he had a fall yesterday and does not know if it is from that because he is not sure if he hit his head or not.Advised to call 911." Insurance: [facility] Initial Assessment   Incoming call from patient returning call from RN CM.Discussed recent call to nure line. Patient voices that he is still having "ringing in his ears" He denies any ear pain. He does state that he fell again on yesterday as well while he was trying to enter into [employer]. He voices he has a small skin tear to arm/elbow area. He si going today to see neuro-Dr . [name]. He has PCP appt on Wed of this eek. patietn vocies that eh lievs alone. He has sister and nice tht live nearby and check on him daily. He is independent with ADls/IADLs.He uses cane to assit with mabualtion. His siter drives him to appts.  Conditions: Per chart review, patient has PMH that includes but not limited to DDD, spinal stenosis, arthritis, GERD, A-fib, HTN,RA, thyroid disease and neuropathy.   Medications: Med review completed with patient. Patient denies any issues managing and/or affording meds at this time. Medications Reviewed Today    Reviewed by Hayden Pedro, RN (Registered Nurse) on 02/17/21 at [place] List Status: <None>  Medication Order Taking? Sig Documenting Provider Last Dose Status Informant  acetaminophen (TYLENOL) 500 MG tablet [id number] No Take 500-1,000 mg by mouth every 6 (six) hours as needed for mild pain or fever.  [provider] Taking Active Other  allopurinol  (ZYLOPRIM) 300 MG tablet [id number]  TAKE 1 TABLET EVERY DAY Karamalegos, Alexander Lenna Sciara, DO  Active   atorvastatin (LIPITOR) 40 MG tablet [id number] No Take 1 tablet (40 mg total) by mouth daily. Olin Hauser, DO Taking Active Pharmacy Records  diltiazem (CARDIZEM CD) 180 MG 24 hr capsule [id number] No Take 1 capsule (180 mg total) by mouth daily. Olin Hauser, DO Taking Active   gabapentin (NEURONTIN) 100 MG capsule [id number] No Take 1 capsule in morning, 1 capsule in afternoon, and 2 capsules at bedtime. Olin Hauser, DO Taking Active   ketoconazole (NIZORAL) 2 % cream [id number] No Apply 1 application topically daily. For up to 2-4 weeks or until healed. Apply to groin Olin Hauser, DO Taking Active   levothyroxine (SYNTHROID) 88 MCG tablet [id number] No Take 1 tablet (88 mcg total) by mouth daily. Olin Hauser, DO Taking Active Pharmacy Records  melatonin 3 MG TABS tablet [id number] No Take 3 mg by mouth at bedtime as needed (sleep). [provider] Taking Active Self  omeprazole (PRILOSEC) 40 MG capsule [id number] No Take 1 capsule (40 mg total) by mouth daily. Olin Hauser, DO Taking Active   polyethylene glycol (MIRALAX / GLYCOLAX) 17 g packet [id number] No Take 17 g by mouth in the morning and at bedtime. [provider] Taking Active   rivaroxaban (XARELTO) 20 MG TABS tablet [id number] No Take 1 tablet (20 mg total) by mouth daily with supper. Olin Hauser, DO Taking Active  SDOH Screenings   Alcohol Screen: Not on file  Depression (PHQ2-9): Low Risk   . PHQ-2 Score: 0  Financial Resource Strain: Not on file  Food Insecurity: No Food Insecurity  . Worried About Charity fundraiser in the Last Year: Never true  . Ran Out of Food in the Last Year: Never true  Housing: Not on file  Physical Activity: Not on file  Social Connections: Not on file  Stress: Not on file  Tobacco Use: Low  Risk   . Smoking Tobacco Use: Never Smoker  . Smokeless Tobacco Use: Never Used  Transportation Needs: No Transportation Needs  . Lack of Transportation (Medical): No  . Lack of Transportation (Non-Medical): No    Fall Risk  02/17/2021 04/26/2020 03/15/2020 03/15/2020  Falls in the past year? 1 0 - 0  Number falls in past yr: 1 0 1 0  Injury with Fall? 1 0 1 0  Risk for fall due to : History of fall(s);Impaired balance/gait;Impaired mobility;Impaired vision;Medication side effect - - -  Follow up Falls evaluation completed;Education provided;Falls prevention discussed Falls evaluation completed Falls evaluation completed Falls evaluation completed   Depression screen [facility] 2/9 02/17/2021 04/26/2020 03/15/2020  Decreased Interest 0 0 0  Down, Depressed, Hopeless 0 0 0  PHQ - 2 Score 0 0 0     Goals Addressed              This Visit's Progress   .  (THN)Prevent Falls and Injury (pt-stated)        Timeframe:  Long-Range Goal Priority:  High Start Date:   02/17/21                          Expected End Date:  05/24/2021                    Follow Up Date: May 2022   - always use handrails on the stairs - keep my cell phone with me always - learn how to get back up if I fall - make an emergency alert plan in case I fall - use a cane or walker - wear my glasses and/or hearing aid    Why is this important?    Most falls happen when it is hard for you to walk safely. Your balance may be off because of an illness. You may have pain in your knees, hip or other joints.   You may be overly tired or taking medicines that make you sleepy. You may not be able to see or hear clearly.   Falls can lead to broken bones, bruises or other injuries.   There are things you can do to help prevent falling.     Notes:   02/17/21-Patient reports two falls over the past weekend attributes it to "ringing in ears" that he is having. He is going today for neuro follow up appt and has PCP appt later this  week. He has cane and voices using it all the time with ambulation.       Consent: [facility] services reviewed and discussed with patient. Verbal consent for services given.   Plan: RN CM discussed with patient next outreach within the month of May. Patient gave verbal consent and in agreement with RN CM follow up and timeframe. Patient aware that they may contact RN CM sooner for any issues or concerns. RN CM reviewed goals and plan of care with patient. Patient agrees to care  plan and follow up. RN CM will send barriers letter and route encounter to PCP. RN CM will send welcome letter to patient.  Enzo Montgomery, RN,BSN,CCM [place] [facility] Telephonic Care Management Coordinator Direct Phone: [phone] Toll Free: [phone] Fax: [phone]

## 2021-02-17 NOTE — Patient Outreach (Signed)
[facility]) Care Management  02/17/2021  Arthur Romero March 31, 1941 [id number]   Telephone Screen  Referral Date: 02/17/2021 Referral Source: Nurse Line Call Center Referral Reason: "Caller informed me that he has ringing and buzzing in both of his ear for just over a week. The right ear is worse than the left. He said it is painful above the ear he thinks. I asked him if he had a ear injury. He said that he had a fall yesterday and does not know if it is from that because he is not sure if he hit his head or not. Advised to call 911." Insurance: [facility]   Outreach attempt # 1 to patient.No answer. RN CM left HIPAA compliant voicemail message along with contact info.      Plan: RN CM will make outreach attempt to patient within 3-4 business days. RN CM will send unsuccessful outreach letter to patient.   Enzo Montgomery, RN,BSN,CCM [place] [facility] Telephonic Care Management Coordinator Direct Phone: [phone] Toll Free: [phone] Fax: [phone]

## 2021-02-18 ENCOUNTER — Telehealth: Payer: Self-pay

## 2021-02-18 DIAGNOSIS — K219 Gastro-esophageal reflux disease without esophagitis: Secondary | ICD-10-CM

## 2021-02-18 MED ORDER — OMEPRAZOLE 40 MG PO CPDR: 40.0000 mg | DELAYED_RELEASE_CAPSULE | Freq: Every day | ORAL | 3 refills | Status: DC

## 2021-02-18 NOTE — Telephone Encounter (Signed)
Copied from [place] [id number]. Topic: General - Other >> Feb 14, 2021  4:55 PM Pawlus, Brayton Layman A wrote: Reason for CRM: [employer] pharamcy was calling to follow up a request they sent over for omeprazole (PRILOSEC) 40 MG capsule, please follow up so the pt can get a refill.

## 2021-02-19 ENCOUNTER — Other Ambulatory Visit: Payer: Self-pay

## 2021-02-19 ENCOUNTER — Encounter: Payer: Self-pay | Admitting: Family Medicine

## 2021-02-19 ENCOUNTER — Ambulatory Visit: Payer: Medicare PPO | Admitting: Family Medicine

## 2021-02-19 VITALS — BP 132/84 | HR 72 | Temp 98.3°F | Ht 75.0 in | Wt 174.0 lb

## 2021-02-19 DIAGNOSIS — H6982 Other specified disorders of Eustachian tube, left ear: Secondary | ICD-10-CM | POA: Diagnosis not present

## 2021-02-19 DIAGNOSIS — J0110 Acute frontal sinusitis, unspecified: Secondary | ICD-10-CM | POA: Diagnosis not present

## 2021-02-19 MED ORDER — AMOXICILLIN-POT CLAVULANATE 875-125 MG PO TABS: 1.0000 | ORAL_TABLET | Freq: Two times a day (BID) | ORAL | 0 refills | Status: DC

## 2021-02-19 MED ORDER — FLUTICASONE PROPIONATE 50 MCG/ACT NA SUSP: 2.0000 | Freq: Every day | NASAL | 3 refills | Status: AC

## 2021-02-19 NOTE — Progress Notes (Signed)
Subjective:    Patient ID: Arthur Romero, male    DOB: April 09, 1941, 80 y.o.   MRN: [id number]  Arthur Romero is a 80 y.o. male presenting on 02/19/2021 for Ear Pain (Bilateral ear pain and dizziness. Nose running. Tinnitus. )   HPI   Acute Sinusitis Eustachian Tube Dysfunction Left Ear Previously in April 2021 he had similar issues had seen [facility] and they ordered MRI and treated with Amoxicillin and ultimately it did improve. He was told negative imaging in past. We reviewed this last year. Now he is having worsening symptoms with sinus pressure and headache and pressure behind ears, L worse than R, has hd prior issue with repair on R ear drum in past with patch Admits nasal congestion Using OTC fluticasone some relief but not regular use  Dizziness/Balance Followed by Neurology [facility] last visit 4/25 he is still having issue with balance. They increased Gabapentin up to 500mg  or 5 pills. He says did better with 4 pills.   Depression screen [facility] 2/9 02/19/2021 02/17/2021 04/26/2020  Decreased Interest 0 0 0  Down, Depressed, Hopeless 0 0 0  PHQ - 2 Score 0 0 0  Altered sleeping 0 - -  Tired, decreased energy 0 - -  Change in appetite 0 - -  Feeling bad or failure about yourself  0 - -  Trouble concentrating 0 - -  Moving slowly or fidgety/restless 0 - -  Suicidal thoughts 0 - -  PHQ-9 Score 0 - -  Difficult doing work/chores Not difficult at all - -    Social History   Tobacco Use  . Smoking status: Never Smoker  . Smokeless tobacco: Never Used  Vaping Use  . Vaping Use: Never used  Substance Use Topics  . Alcohol use: Never    Alcohol/week: 1.0 standard drink    Types: 1 Glasses of wine per week  . Drug use: Never    Review of Systems Per HPI unless specifically indicated above     Objective:    BP 132/84   Pulse 72   Temp 98.3 F (36.8 C) (Oral)   Ht 6\' 3"  (1.905 m)   Wt 174 lb (78.9 kg)   SpO2 98%   BMI 21.75 kg/m   Wt Readings from Last 3  Encounters:  02/19/21 174 lb (78.9 kg)  01/13/21 173 lb (78.5 kg)  12/06/20 173 lb 9.6 oz (78.7 kg)    Physical Exam Vitals and nursing note reviewed.  Constitutional:      General: He is not in acute distress.    Appearance: He is well-developed. He is not diaphoretic.     Comments: Well-appearing, comfortable, cooperative  HENT:     Head: Normocephalic and atraumatic.     Right Ear: Ear canal and external ear normal. There is no impacted cerumen.     Left Ear: Ear canal and external ear normal. There is no impacted cerumen.     Ears:     Comments: Left TM with effusion, no erythema  R TM s/p surgical fix patch Eyes:     General:        Right eye: No discharge.        Left eye: No discharge.     Conjunctiva/sclera: Conjunctivae normal.  Cardiovascular:     Rate and Rhythm: Normal rate.  Pulmonary:     Effort: Pulmonary effort is normal.  Musculoskeletal:     Comments: Using walker  Skin:    General: Skin is  warm and dry.     Findings: No erythema or rash.  Neurological:     Mental Status: He is alert and oriented to person, place, and time.  Psychiatric:        Behavior: Behavior normal.     Comments: Well groomed, good eye contact, normal speech and thoughts    Results for orders placed or performed in visit on 12/06/20  POCT HgB A1C  Result Value Ref Range   Hemoglobin A1C 6.1 (A) 4.0 - 5.6 %      Assessment & Plan:   Problem List Items Addressed This Visit   None   Visit Diagnoses    Acute non-recurrent frontal sinusitis    -  Primary   Relevant Medications   fluticasone (FLONASE) 50 MCG/ACT nasal spray   amoxicillin-clavulanate (AUGMENTIN) 875-125 MG tablet   Chronic dysfunction of left eustachian tube       Relevant Medications   amoxicillin-clavulanate (AUGMENTIN) 875-125 MG tablet      Consistent with acute frontal rhinosinusitis, likely initially viral URI vs allergic rhinitis component with worsening concern for bacterial infection. Similar to  01/2020 last year saw ENT had MRI treated with amoxicillin had issue with eustachian tube dysfunction impacting him with balance dizziness   Plan: 1. Start Augmentin 875-125mg  PO BID x 10 days 2. Start nasal steroid Flonase 2 sprays in each nostril daily for 4-6 weeks, may repeat course seasonally or as needed Return criteria reviewed  #DIzziness/Balance Followed by Dr [name] Neuro They adjusted meds 2 days ago at last visit, he cannot tolerate Nortriptyline and had issue with gabapentin high dose 500 a day, he prefers 400mg  or 1 in AM 1 in PM and 2 in QHS instead with better results tolerated.   Meds ordered this encounter  Medications  . fluticasone (FLONASE) 50 MCG/ACT nasal spray    Sig: Place 2 sprays into both nostrils daily. Use for 4-6 weeks then stop and use seasonally or as needed.    Dispense:  16 g    Refill:  3  . amoxicillin-clavulanate (AUGMENTIN) 875-125 MG tablet    Sig: Take 1 tablet by mouth 2 (two) times daily.    Dispense:  20 tablet    Refill:  0      Follow up plan: Return if symptoms worsen or fail to improve.   [name], [facility] 02/19/2021, 1:42 PM

## 2021-02-19 NOTE — Patient Instructions (Addendum)
Thank you for coming to the office today.  You may dial back the Gabapentin if you have to to 4 pills that is fine. If you had side effects on the 5 pills 500mg .  Restart nasal steroid Flonase 2 sprays in each nostril daily for 4-6 weeks, may repeat course seasonally or as needed  Start Augmentin antibiotic twice a day for 10 days.  Similar to previous sinus issue back in Spring 2021  Please schedule a Follow-up Appointment to: Return if symptoms worsen or fail to improve.  If you have any other questions or concerns, please feel free to call the office or send a message through [place]. You may also schedule an earlier appointment if necessary.  Additionally, you may be receiving a survey about your experience at our office within a few days to 1 week by e-mail or mail. We value your feedback.  [name], DO [place]

## 2021-02-27 ENCOUNTER — Other Ambulatory Visit: Payer: Self-pay

## 2021-02-27 NOTE — Patient Outreach (Signed)
[facility]) Care Management  02/27/2021  Arthur Romero [birth year] [id number]   Telephone Assessment Follow Up Call   Successful outreach to patient. Patient reports he is doing fairly well. He has followed up with both PCP and neuro MD. He denies any more falls/accidnets in the home. He is suing DME to aide in ambulation. He denies any RN CM needs or concerns at this time.    Medications Reviewed Today    Reviewed by Hayden Pedro, RN (Registered Nurse) on 02/27/21 at [place] List Status: <None>  Medication Order Taking? Sig Documenting Provider Last Dose Status Informant  acetaminophen (TYLENOL) 500 MG tablet [id number] No Take 500-1,000 mg by mouth every 6 (six) hours as needed for mild pain or fever.  [provider] Taking Active Other  allopurinol (ZYLOPRIM) 300 MG tablet [id number] No TAKE 1 TABLET EVERY DAY Karamalegos, Devonne Doughty, DO Taking Active   amoxicillin-clavulanate (AUGMENTIN) 875-125 MG tablet [id number]  Take 1 tablet by mouth 2 (two) times daily. Olin Hauser, DO  Active   atorvastatin (LIPITOR) 40 MG tablet [id number] No Take 1 tablet (40 mg total) by mouth daily. Olin Hauser, DO Taking Active Pharmacy Records  diltiazem (CARDIZEM CD) 180 MG 24 hr capsule [id number] No Take 1 capsule (180 mg total) by mouth daily. Olin Hauser, DO Taking Active   fluticasone (FLONASE) 50 MCG/ACT nasal spray [id number]  Place 2 sprays into both nostrils daily. Use for 4-6 weeks then stop and use seasonally or as needed. Karamalegos, Devonne Doughty, DO  Active   gabapentin (NEURONTIN) 100 MG capsule [id number] No Take 1 capsule in morning, 1 capsule in afternoon, and 2 capsules at bedtime. Olin Hauser, DO Taking Active   ketoconazole (NIZORAL) 2 % cream [id number] No Apply 1 application topically daily. For up to 2-4 weeks or until healed. Apply to groin Olin Hauser, DO Taking Active    levothyroxine (SYNTHROID) 88 MCG tablet [id number] No Take 1 tablet (88 mcg total) by mouth daily. Olin Hauser, DO Taking Active Pharmacy Records  melatonin 3 MG TABS tablet [id number] No Take 3 mg by mouth at bedtime as needed (sleep). [provider] Taking Active Self  omeprazole (PRILOSEC) 40 MG capsule [id number] No Take 1 capsule (40 mg total) by mouth daily. Olin Hauser, DO Taking Active   polyethylene glycol (MIRALAX / GLYCOLAX) 17 g packet [id number] No Take 17 g by mouth in the morning and at bedtime. [provider] Taking Active   rivaroxaban (XARELTO) 20 MG TABS tablet [id number] No Take 1 tablet (20 mg total) by mouth daily with supper. Olin Hauser, DO Taking Active          Goals Addressed              This Visit's Progress   .  (THN)Prevent Falls and Injury (pt-stated)        Timeframe:  Long-Range Goal Priority:  High Start Date:   02/17/21                          Expected End Date:  05/24/2021                    Follow Up Date: July 2022   - always use handrails on the stairs - keep my cell phone with me always - learn how to get back up if I fall - make an emergency alert plan  in case I fall - use a cane or walker - wear my glasses and/or hearing aid    Why is this important?    Most falls happen when it is hard for you to walk safely. Your balance may be off because of an illness. You may have pain in your knees, hip or other joints.   You may be overly tired or taking medicines that make you sleepy. You may not be able to see or hear clearly.   Falls can lead to broken bones, bruises or other injuries.   There are things you can do to help prevent falling.     Notes:   02/17/21-Patient reports two falls over the past weekend attributes it to "ringing in ears" that he is having. He is going today for neuro follow up appt and has PCP appt later this week. He has cane and voices using it all the time with  ambulation.   02/27/21-Patient completed follow up appt with both PCP and neuro regarding recent fall and ear issues. His Gabapentin was adjusted and he was started on abx therapy. Patient reports he can not tell a big difference in sxs improvement yet. He is aware to follow back up with MD if sxs worsen and/or unresolved. He states that he will probably go ahead and make ENT appt as well. He has had no further falls. He is using DME to assist with ambulation. Patient has life alert device in the home as well.        Plan: RN CM discussed with patient next outreach within the month of July. Patient gave verbal consent and in agreement with RN CM follow up and timeframe. Patient aware that they may contact RN CM sooner for any issues or concerns. RN CM reviewed goals and plan of care with patient. Patient agrees to care plan and follow up.  Enzo Montgomery, RN,BSN,CCM [place] Management Telephonic Care Management Coordinator Direct Phone: [phone] Toll Free: [phone] Fax: [phone]

## 2021-03-03 DIAGNOSIS — M5416 Radiculopathy, lumbar region: Secondary | ICD-10-CM | POA: Diagnosis not present

## 2021-03-03 DIAGNOSIS — M5136 Other intervertebral disc degeneration, lumbar region: Secondary | ICD-10-CM | POA: Diagnosis not present

## 2021-03-03 DIAGNOSIS — M5126 Other intervertebral disc displacement, lumbar region: Secondary | ICD-10-CM | POA: Diagnosis not present

## 2021-03-03 DIAGNOSIS — M48062 Spinal stenosis, lumbar region with neurogenic claudication: Secondary | ICD-10-CM | POA: Diagnosis not present

## 2021-03-03 DIAGNOSIS — M48061 Spinal stenosis, lumbar region without neurogenic claudication: Secondary | ICD-10-CM | POA: Diagnosis not present

## 2021-03-04 DIAGNOSIS — M5136 Other intervertebral disc degeneration, lumbar region: Secondary | ICD-10-CM | POA: Diagnosis not present

## 2021-03-04 DIAGNOSIS — M5416 Radiculopathy, lumbar region: Secondary | ICD-10-CM | POA: Diagnosis not present

## 2021-03-04 DIAGNOSIS — M5126 Other intervertebral disc displacement, lumbar region: Secondary | ICD-10-CM | POA: Diagnosis not present

## 2021-03-10 ENCOUNTER — Ambulatory Visit (INDEPENDENT_AMBULATORY_CARE_PROVIDER_SITE_OTHER): Payer: Medicare PPO | Admitting: Family Medicine

## 2021-03-10 ENCOUNTER — Other Ambulatory Visit: Payer: Self-pay

## 2021-03-10 ENCOUNTER — Encounter: Payer: Self-pay | Admitting: Family Medicine

## 2021-03-10 VITALS — Ht 75.0 in

## 2021-03-10 DIAGNOSIS — J3089 Other allergic rhinitis: Secondary | ICD-10-CM

## 2021-03-10 DIAGNOSIS — J0110 Acute frontal sinusitis, unspecified: Secondary | ICD-10-CM | POA: Diagnosis not present

## 2021-03-10 MED ORDER — LEVOFLOXACIN 500 MG PO TABS
500.0000 mg | ORAL_TABLET | Freq: Every day | ORAL | 0 refills | Status: DC
Start: 2021-03-10 — End: 2021-05-20

## 2021-03-10 MED ORDER — MONTELUKAST SODIUM 10 MG PO TABS: 10.0000 mg | ORAL_TABLET | Freq: Every day | ORAL | 2 refills | Status: DC

## 2021-03-10 NOTE — Progress Notes (Addendum)
Virtual Visit via Telephone The purpose of this virtual visit is to provide medical care while limiting exposure to the novel coronavirus (COVID19) for both patient and office staff.  Consent was obtained for phone visit:  Yes.   Answered questions that patient had about telehealth interaction:  Yes.   I discussed the limitations, risks, security and privacy concerns of performing an evaluation and management service by telephone. I also discussed with the patient that there may be a patient responsible charge related to this service. The patient expressed understanding and agreed to proceed.  Patient Location: Home Provider Location: [facility] (Office)  Participants in virtual visit: - Patient: Arthur Romero - CMA: Perfecto Kingdom, CMA - Provider: Dr Parks Ranger  ---------------------------------------------------------------------- Chief Complaint  Patient presents with  . Headache    X3 days, Pt feels sick, possible sinus infection, dizzy, right ear pain, no fever, no cough, pressure on sinuses    S: Reviewed CMA documentation. I have called patient and gathered additional HPI as follows:  - Last visit with me 02/19/21, for initial visit for same problem acute sinusitis, treated with Augmentin, see prior notes for background information.  - Interval update with some temporary improvement. Also continues Flonase from last visit. He saw ENT in past and they referred to Neuro for dizziness. No other treatment change  - Today patient reports similar symptoms again of sick sinus congestion pressure still dizziness, R ear pain.  Denies any known or suspected exposure to person with or possibly with COVID19.  Denies any fevers, chills, sweats, body ache, cough, shortness of breath,  abdominal pain, diarrhea  Past Medical History:  Diagnosis Date  . Arthritis   . GERD (gastroesophageal reflux disease)   . Hypertension   . Rheumatoid arthritis ([place])   . Thyroid  disease    Social History   Tobacco Use  . Smoking status: Never Smoker  . Smokeless tobacco: Never Used  Vaping Use  . Vaping Use: Never used  Substance Use Topics  . Alcohol use: Never    Alcohol/week: 1.0 standard drink    Types: 1 Glasses of wine per week  . Drug use: Never    Current Outpatient Medications:  .  acetaminophen (TYLENOL) 500 MG tablet, Take 500-1,000 mg by mouth every 6 (six) hours as needed for mild pain or fever. , Disp: , Rfl:  .  allopurinol (ZYLOPRIM) 300 MG tablet, TAKE 1 TABLET EVERY DAY, Disp: 90 tablet, Rfl: 1 .  atorvastatin (LIPITOR) 40 MG tablet, Take 1 tablet (40 mg total) by mouth daily., Disp: 90 tablet, Rfl: 1 .  Cholecalciferol (VITAMIN D3 PO), Take by mouth daily., Disp: , Rfl:  .  diltiazem (CARDIZEM CD) 180 MG 24 hr capsule, Take 1 capsule (180 mg total) by mouth daily. (Patient taking differently: Take 180 mg by mouth daily.), Disp: 90 capsule, Rfl: 3 .  fluticasone (FLONASE) 50 MCG/ACT nasal spray, Place 2 sprays into both nostrils daily. Use for 4-6 weeks then stop and use seasonally or as needed., Disp: 16 g, Rfl: 3 .  gabapentin (NEURONTIN) 100 MG capsule, Take 1 capsule in morning, 1 capsule in afternoon, and 2 capsules at bedtime., Disp: 360 capsule, Rfl: 1 .  levofloxacin (LEVAQUIN) 500 MG tablet, Take 1 tablet (500 mg total) by mouth daily. For 7 days, Disp: 7 tablet, Rfl: 0 .  levothyroxine (SYNTHROID) 88 MCG tablet, Take 1 tablet (88 mcg total) by mouth daily., Disp: 90 tablet, Rfl: 3 .  melatonin 3 MG  TABS tablet, Take 3 mg by mouth at bedtime as needed (sleep)., Disp: , Rfl:  .  montelukast (SINGULAIR) 10 MG tablet, Take 1 tablet (10 mg total) by mouth at bedtime., Disp: 30 tablet, Rfl: 2 .  omeprazole (PRILOSEC) 40 MG capsule, Take 1 capsule (40 mg total) by mouth daily., Disp: 90 capsule, Rfl: 3 .  polyethylene glycol (MIRALAX / GLYCOLAX) 17 g packet, Take 17 g by mouth in the morning and at bedtime., Disp: , Rfl:  .  rivaroxaban  (XARELTO) 20 MG TABS tablet, Take 1 tablet (20 mg total) by mouth daily with supper., Disp: 90 tablet, Rfl: 3  Depression screen [facility] 2/9 03/10/2021 02/19/2021 02/17/2021  Decreased Interest 1 0 0  Down, Depressed, Hopeless 0 0 0  PHQ - 2 Score 1 0 0  Altered sleeping 0 0 -  Tired, decreased energy 0 0 -  Change in appetite 0 0 -  Feeling bad or failure about yourself  0 0 -  Trouble concentrating 0 0 -  Moving slowly or fidgety/restless 0 0 -  Suicidal thoughts 0 0 -  PHQ-9 Score 1 0 -  Difficult doing work/chores - Not difficult at all -    GAD 7 : Generalized Anxiety Score 03/10/2021  Nervous, Anxious, on Edge 1  Control/stop worrying 1  Worry too much - different things 1  Trouble relaxing 0  Restless 0  Easily annoyed or irritable 0  Afraid - awful might happen 0  Total GAD 7 Score 3    -------------------------------------------------------------------------- O: No physical exam performed due to remote telephone encounter.  Lab results reviewed.  No results found for this or any previous visit (from the past 2160 hour(s)).  -------------------------------------------------------------------------- A&P:  Problem List Items Addressed This Visit   None   Visit Diagnoses    Acute non-recurrent frontal sinusitis    -  Primary   Relevant Medications   levofloxacin (LEVAQUIN) 500 MG tablet   Seasonal allergic rhinitis due to other allergic trigger       Relevant Medications   montelukast (SINGULAIR) 10 MG tablet      Consistent with acute frontal rhinosinusitis possible recurrent infection Underlying allergy component  Similar to 01/2020 last year saw ENT had MRI treated with amoxicillin had issue with eustachian tube dysfunction impacting him with balance dizziness   Plan: 1. Start taking Levaquin antibiotic 500mg  daily x 7 days 2. CONTINUE  nasal steroid Flonase 2 sprays in each nostril daily for 4-6 weeks, may repeat course seasonally or as needed Add  Montelukast singulair for allergies 10mg  nightly non drowsy  Return criteria reviewed - advised next step would be return to ENT if still unresolved sinusitis.  Meds ordered this encounter  Medications  . levofloxacin (LEVAQUIN) 500 MG tablet    Sig: Take 1 tablet (500 mg total) by mouth daily. For 7 days    Dispense:  7 tablet    Refill:  0  . montelukast (SINGULAIR) 10 MG tablet    Sig: Take 1 tablet (10 mg total) by mouth at bedtime.    Dispense:  30 tablet    Refill:  2    Follow-up: PRN  Patient verbalizes understanding with the above medical recommendations including the limitation of remote medical advice.  Specific follow-up and call-back criteria were given for patient to follow-up or seek medical care more urgently if needed.   - Time spent in direct consultation with patient on phone: 7 minutes   [name], [place]  [facility] 03/10/2021, 11:44 AM

## 2021-03-25 DIAGNOSIS — M5136 Other intervertebral disc degeneration, lumbar region: Secondary | ICD-10-CM | POA: Diagnosis not present

## 2021-03-25 DIAGNOSIS — M5416 Radiculopathy, lumbar region: Secondary | ICD-10-CM | POA: Diagnosis not present

## 2021-03-25 DIAGNOSIS — M48061 Spinal stenosis, lumbar region without neurogenic claudication: Secondary | ICD-10-CM | POA: Diagnosis not present

## 2021-03-25 DIAGNOSIS — M5126 Other intervertebral disc displacement, lumbar region: Secondary | ICD-10-CM | POA: Diagnosis not present

## 2021-04-03 DIAGNOSIS — M5126 Other intervertebral disc displacement, lumbar region: Secondary | ICD-10-CM | POA: Diagnosis not present

## 2021-04-03 DIAGNOSIS — M5416 Radiculopathy, lumbar region: Secondary | ICD-10-CM | POA: Diagnosis not present

## 2021-04-03 DIAGNOSIS — M48061 Spinal stenosis, lumbar region without neurogenic claudication: Secondary | ICD-10-CM | POA: Diagnosis not present

## 2021-04-07 ENCOUNTER — Other Ambulatory Visit: Payer: Self-pay

## 2021-04-07 ENCOUNTER — Other Ambulatory Visit: Payer: Self-pay | Admitting: Unknown Physician Specialty

## 2021-04-07 ENCOUNTER — Ambulatory Visit
Admission: RE | Admit: 2021-04-07 | Discharge: 2021-04-07 | Disposition: A | Discharge status: Home / Self Care | Payer: Medicare PPO | Source: Ambulatory Visit | Attending: Unknown Physician Specialty | Admitting: Unknown Physician Specialty

## 2021-04-07 ENCOUNTER — Ambulatory Visit
Admission: RE | Admit: 2021-04-07 | Discharge: 2021-04-07 | Disposition: A | Discharge status: Home / Self Care | Payer: Medicare PPO | Attending: Unknown Physician Specialty | Admitting: Unknown Physician Specialty

## 2021-04-07 DIAGNOSIS — J019 Acute sinusitis, unspecified: Secondary | ICD-10-CM

## 2021-04-07 DIAGNOSIS — H903 Sensorineural hearing loss, bilateral: Secondary | ICD-10-CM | POA: Diagnosis not present

## 2021-04-07 DIAGNOSIS — J329 Chronic sinusitis, unspecified: Secondary | ICD-10-CM | POA: Diagnosis not present

## 2021-04-07 DIAGNOSIS — R519 Headache, unspecified: Secondary | ICD-10-CM | POA: Diagnosis not present

## 2021-04-07 IMAGING — CR DG SINUSES COMPLETE 3+V
1 series · 6 of 6 positions shown · non-contrast
Comparison: None.

CLINICAL DATA: Sinusitis

EXAM:
PARANASAL SINUSES - COMPLETE 3 + VIEW

[Series 1: dg sinuses complete · 0.14mm/px · 6 of 6 slices shown]
[im 1/6]
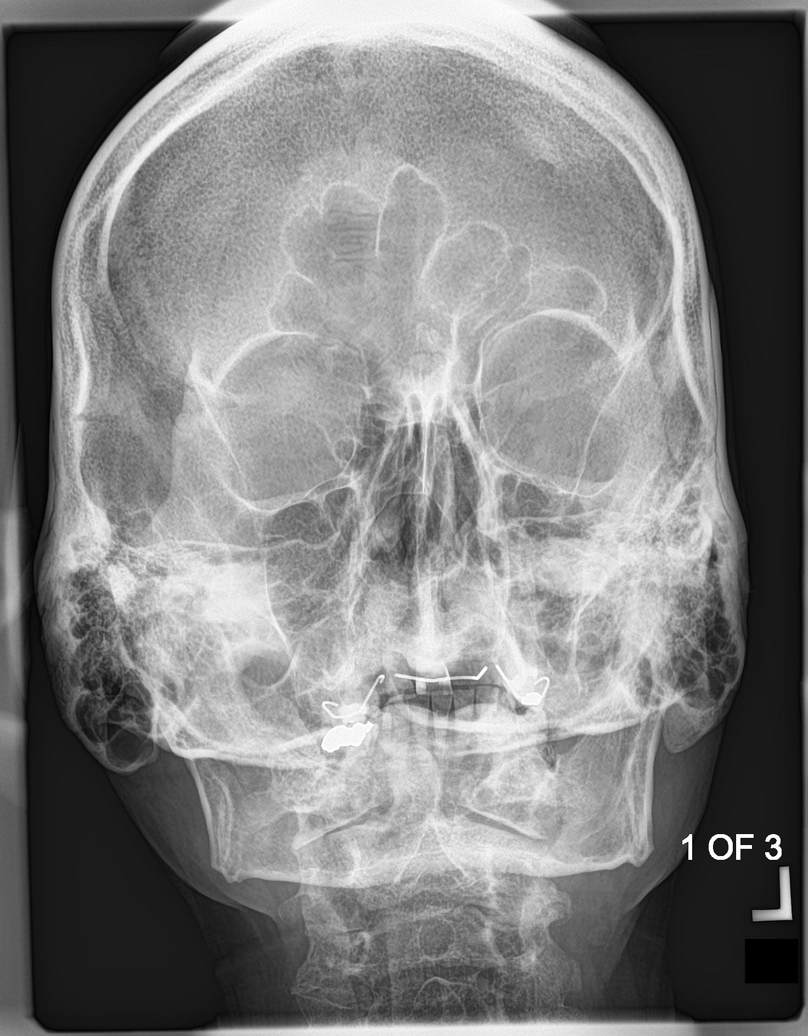
[im 2/6]
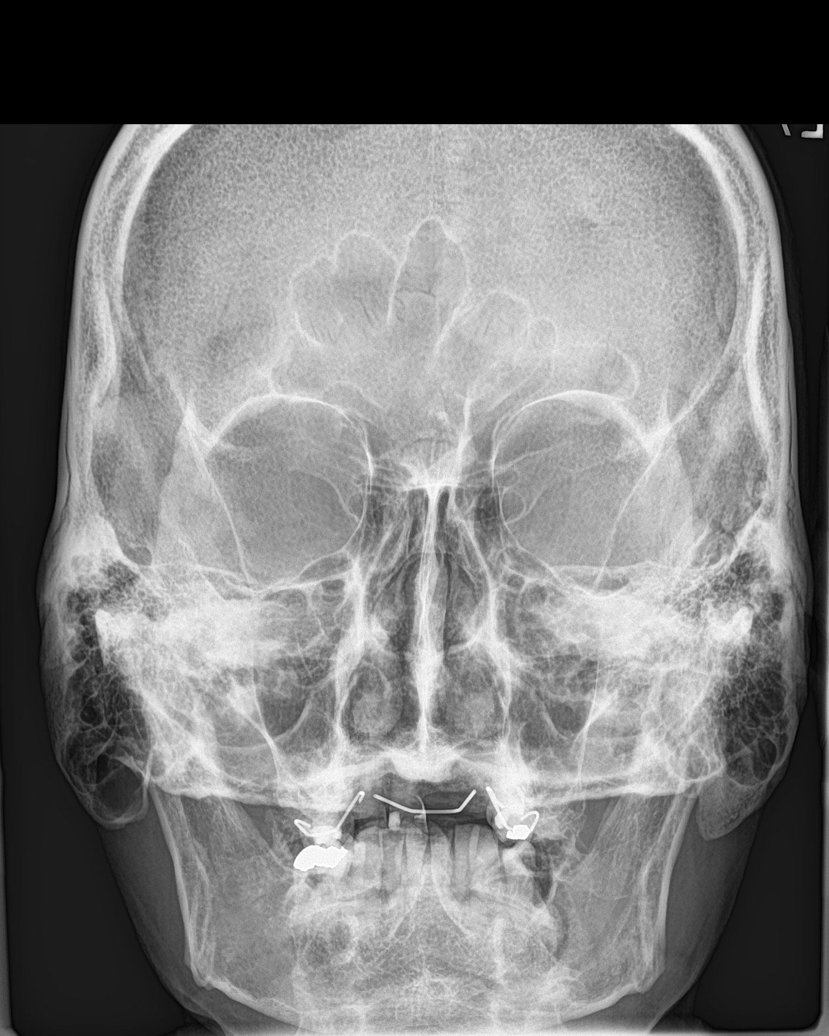
[im 3/6]
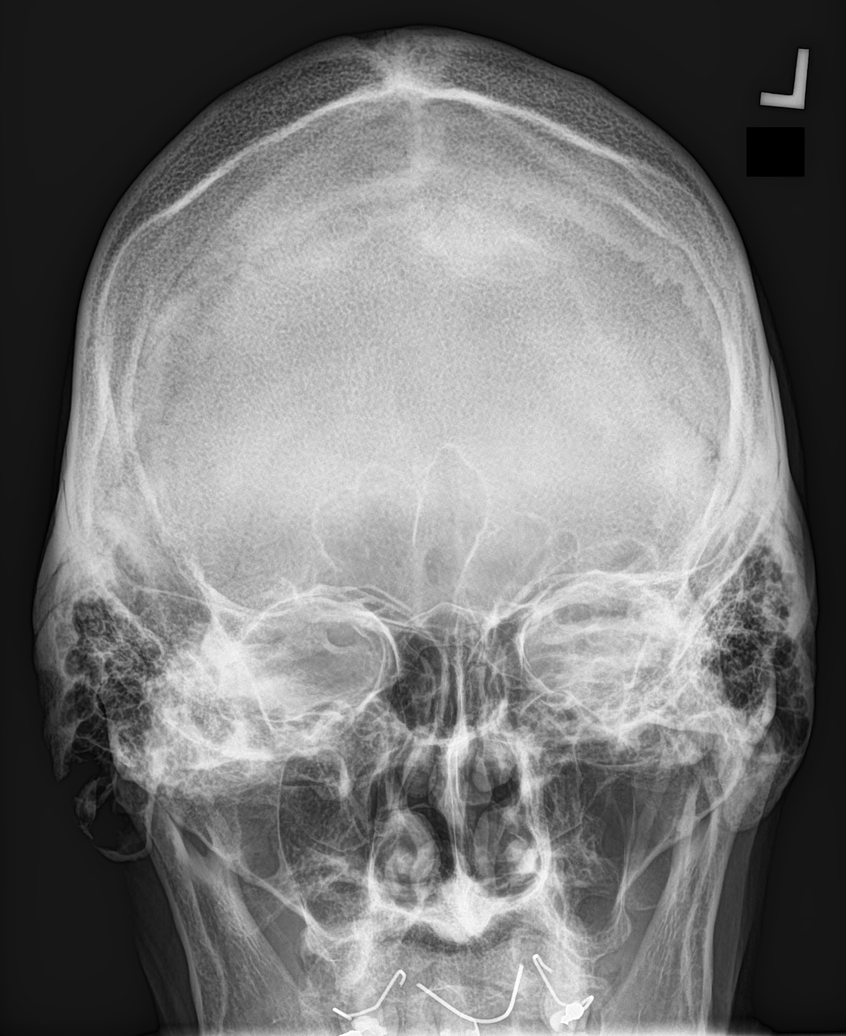
[im 4/6]
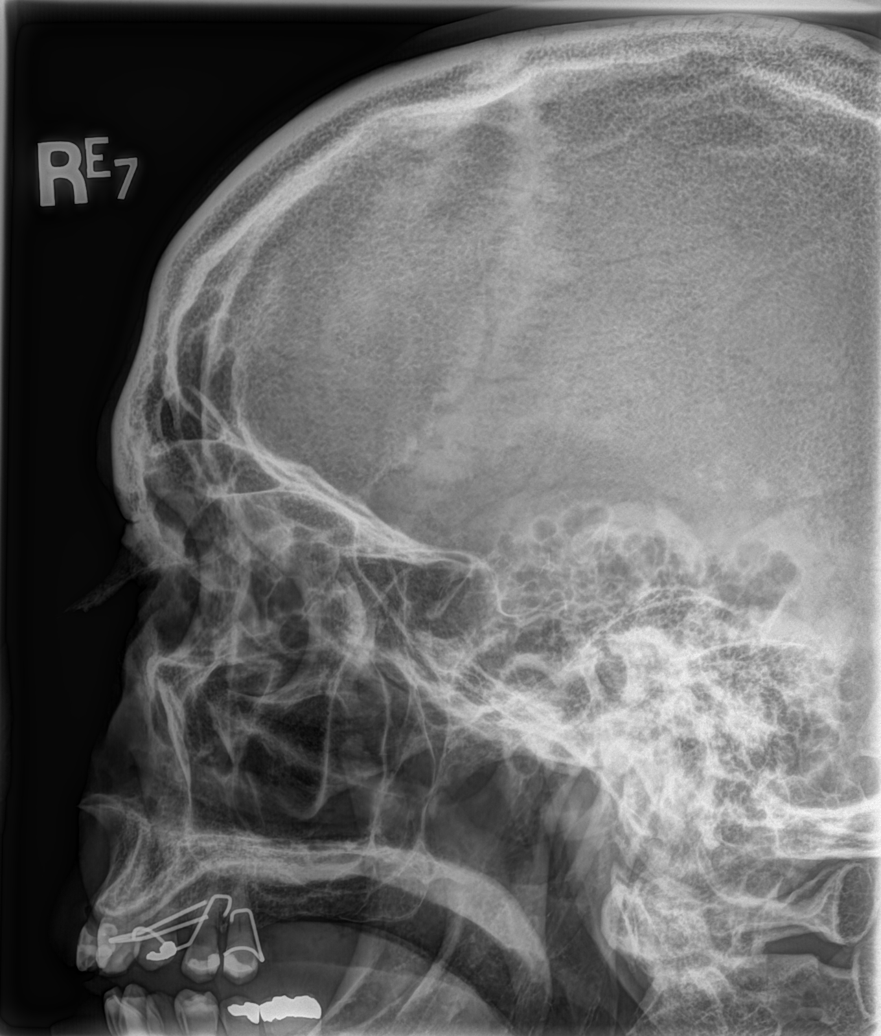
[im 5/6]
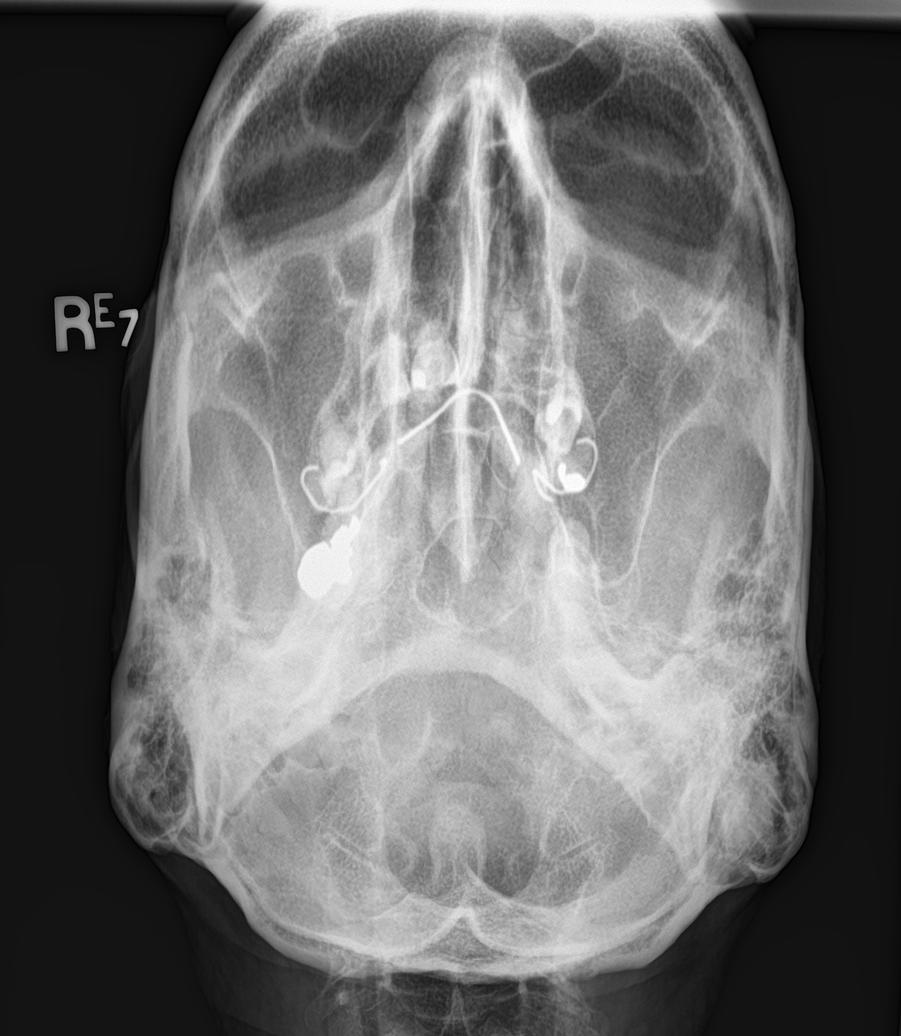
[im 6/6]
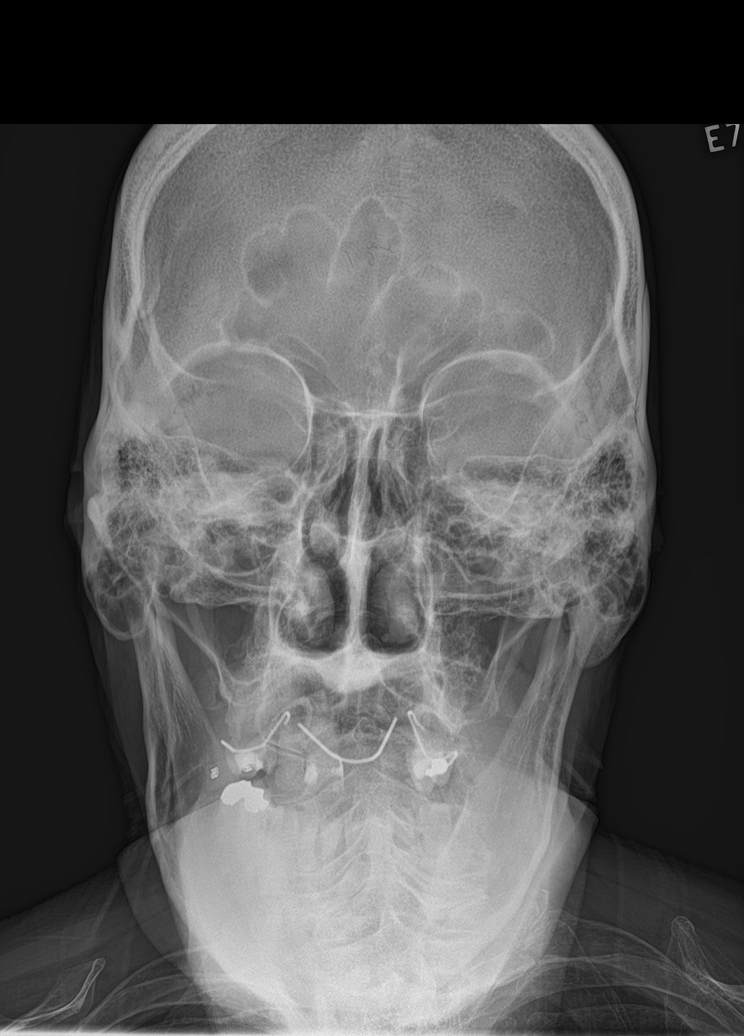

[6 of 6 positions shown; findings below may reference images not displayed]

FINDINGS: The paranasal sinus are aerated. There is no visible sinus
opacification air-fluid levels or mucosal thickening. No significant
bone abnormalities are seen.
IMPRESSION: Nonstandard assessment of the paranasal sinuses without visible
abnormality. If continued clinical concern, maxillofacial CT is the
standard for sinus evaluation.

## 2021-04-15 ENCOUNTER — Other Ambulatory Visit: Payer: Self-pay | Admitting: Family Medicine

## 2021-04-15 DIAGNOSIS — E782 Mixed hyperlipidemia: Secondary | ICD-10-CM

## 2021-04-15 NOTE — Telephone Encounter (Signed)
Requested medications are due for refill today.  yes  Requested medications are on the active medications list.  yes  Last refill. 03/27/2020  Future visit scheduled.   yes  Notes to clinic.  Prescription is expired.

## 2021-04-15 NOTE — Telephone Encounter (Signed)
Requested Prescriptions  Pending Prescriptions Disp Refills  . atorvastatin (LIPITOR) 40 MG tablet [Pharmacy Med Name: ATORVASTATIN CALCIUM 40 MG Tablet] 90 tablet 1    Sig: TAKE 1 TABLET EVERY DAY     Cardiovascular:  Antilipid - Statins Failed - 04/15/2021  8:49 PM      Failed - Total Cholesterol in normal range and within 360 days    No results found for: CHOL, POCCHOL, CHOLTOT       Failed - LDL in normal range and within 360 days    No results found for: LDLCALC, LDLC, HIRISKLDL, POCLDL, LDLDIRECT, REALLDLC, TOTLDLC       Failed - HDL in normal range and within 360 days    No results found for: HDL, POCHDL       Failed - Triglycerides in normal range and within 360 days    No results found for: TRIG, POCTRIG       Passed - Patient is not pregnant      Passed - Valid encounter within last 12 months    Recent Outpatient Visits          1 month ago Acute non-recurrent frontal sinusitis   [place], Devonne Doughty, DO   1 month ago Acute non-recurrent frontal sinusitis   [place], DO   3 months ago DDD (degenerative disc disease), lumbar   [place], DO   4 months ago Elevated hemoglobin A1c   [place], DO   8 months ago Elevated hemoglobin A1c   [place], DO      Future Appointments            In 1 month Parks Ranger, Devonne Doughty, DO [facility], [facility]           . levothyroxine (SYNTHROID) 88 MCG tablet [Pharmacy Med Name: LEVOTHYROXINE SODIUM 88 MCG Tablet] 90 tablet 3    Sig: TAKE 1 TABLET (88 MCG TOTAL) BY MOUTH DAILY.     Endocrinology:  Hypothyroid Agents Failed - 04/15/2021  8:49 PM      Failed - TSH needs to be rechecked within 3 months after an abnormal result. Refill until TSH is due.      Passed - TSH in normal range and within 360 days    TSH   Date Value Ref Range Status  05/19/2020 1.550 0.350 - 4.500 uIU/mL Final    Comment:    Performed by a 3rd Generation assay with a functional sensitivity of <=0.01 uIU/mL. Performed at [facility], [address] [place], [place] [address]   03/18/2020 4.26 0.40 - 4.50 mIU/L Final         Passed - Valid encounter within last 12 months    Recent Outpatient Visits          1 month ago Acute non-recurrent frontal sinusitis   [facility] [place], Devonne Doughty, DO   1 month ago Acute non-recurrent frontal sinusitis   [facility] [place], Devonne Doughty, DO   3 months ago DDD (degenerative disc disease), lumbar   [place], DO   4 months ago Elevated hemoglobin A1c   [place], DO   8 months ago Elevated hemoglobin A1c   [place], [place]      Future Appointments  In 1 month Karamalegos, [facility], [facility]

## 2021-04-18 ENCOUNTER — Other Ambulatory Visit: Payer: Self-pay | Admitting: Family Medicine

## 2021-04-18 DIAGNOSIS — I480 Paroxysmal atrial fibrillation: Secondary | ICD-10-CM

## 2021-04-18 DIAGNOSIS — I10 Essential (primary) hypertension: Secondary | ICD-10-CM

## 2021-04-18 MED ORDER — DILTIAZEM HCL ER COATED BEADS 180 MG PO CP24: 180.0000 mg | ORAL_CAPSULE | Freq: Every day | ORAL | 3 refills | Status: AC

## 2021-04-20 ENCOUNTER — Other Ambulatory Visit: Payer: Self-pay | Admitting: Family Medicine

## 2021-04-20 DIAGNOSIS — M216X1 Other acquired deformities of right foot: Secondary | ICD-10-CM

## 2021-04-20 DIAGNOSIS — G589 Mononeuropathy, unspecified: Secondary | ICD-10-CM

## 2021-04-20 NOTE — Telephone Encounter (Signed)
Requested Prescriptions  Pending Prescriptions Disp Refills  . gabapentin (NEURONTIN) 100 MG capsule [Pharmacy Med Name: GABAPENTIN 100 MG Capsule] 360 capsule 1    Sig: TAKE 1 CAPSULE IN THE MORNING, 1 CAPSULE IN THE AFTERNOON, AND 2 CAPSULES AT BEDTIME.     Neurology: Anticonvulsants - gabapentin Passed - 04/20/2021  1:41 AM      Passed - Valid encounter within last 12 months    Recent Outpatient Visits          1 month ago Acute non-recurrent frontal sinusitis   [facility] [place], Devonne Doughty, DO   2 months ago Acute non-recurrent frontal sinusitis   [facility] [place], Devonne Doughty, DO   3 months ago DDD (degenerative disc disease), lumbar   [place], DO   4 months ago Elevated hemoglobin A1c   [place], DO   8 months ago Elevated hemoglobin A1c   [facility] Parks Ranger, Devonne Doughty, DO      Future Appointments            In 1 month Parks Ranger, Devonne Doughty, DO [facility], [facility]

## 2021-04-25 ENCOUNTER — Other Ambulatory Visit: Payer: Self-pay

## 2021-04-25 DIAGNOSIS — M5489 Other dorsalgia: Secondary | ICD-10-CM | POA: Diagnosis not present

## 2021-04-25 DIAGNOSIS — M546 Pain in thoracic spine: Secondary | ICD-10-CM | POA: Diagnosis not present

## 2021-04-25 DIAGNOSIS — M5126 Other intervertebral disc displacement, lumbar region: Secondary | ICD-10-CM | POA: Diagnosis not present

## 2021-04-25 DIAGNOSIS — M5136 Other intervertebral disc degeneration, lumbar region: Secondary | ICD-10-CM | POA: Diagnosis not present

## 2021-04-25 DIAGNOSIS — M5416 Radiculopathy, lumbar region: Secondary | ICD-10-CM | POA: Diagnosis not present

## 2021-04-25 DIAGNOSIS — G8929 Other chronic pain: Secondary | ICD-10-CM | POA: Diagnosis not present

## 2021-04-25 DIAGNOSIS — M48061 Spinal stenosis, lumbar region without neurogenic claudication: Secondary | ICD-10-CM | POA: Diagnosis not present

## 2021-04-25 NOTE — Patient Outreach (Signed)
[facility] [facility]) Care Management  04/25/2021  Arthur Romero 11-08-1940 [id number]   Telephone Assessment   Unsuccessful outreach attempt to patient. No answer after several rings and no voicemail set up.     Plan: RN CM will make outreach attempt to patient within the month of August if no return call.    Enzo Montgomery, RN,BSN,CCM [place] Management Telephonic Care Management Coordinator Direct Phone: [phone] Toll Free: [phone] Fax: [phone]

## 2021-05-05 ENCOUNTER — Other Ambulatory Visit: Payer: Self-pay

## 2021-05-05 ENCOUNTER — Other Ambulatory Visit: Payer: Self-pay | Admitting: Family Medicine

## 2021-05-05 DIAGNOSIS — S22000A Wedge compression fracture of unspecified thoracic vertebra, initial encounter for closed fracture: Secondary | ICD-10-CM

## 2021-05-08 DIAGNOSIS — M5442 Lumbago with sciatica, left side: Secondary | ICD-10-CM | POA: Diagnosis not present

## 2021-05-08 DIAGNOSIS — G8929 Other chronic pain: Secondary | ICD-10-CM | POA: Diagnosis not present

## 2021-05-08 DIAGNOSIS — G609 Hereditary and idiopathic neuropathy, unspecified: Secondary | ICD-10-CM | POA: Diagnosis not present

## 2021-05-09 ENCOUNTER — Ambulatory Visit: Payer: Self-pay

## 2021-05-09 NOTE — Telephone Encounter (Signed)
Nose bleeds on blood thinner Xarelto can be common unfortunately. Especially if dry nasal tissue or blowing nose or allergies etc. Otherwise it can happen spontaneously.  No ED required unless non stop significant bleeding >15 minutes despite efforts to stop it.  May use a nasal saline or vaseline / neosporin type ointment to help moisten and protect nasal tissue to avoid dryness.  [name], [facility] 05/09/2021, 12:31 PM

## 2021-05-09 NOTE — Telephone Encounter (Signed)
Answer Assessment - Initial Assessment Questions 1. AMOUNT OF BLEEDING: "How bad is the bleeding?" "How much blood was lost?" "Has the bleeding stopped?"   - MILD: needed a couple tissues   - MODERATE: needed many tissues   - SEVERE: large blood clots, soaked many tissues, lasted more than 30 minutes      Moderate 2. ONSET: "When did the nosebleed start?"      This morning 3. FREQUENCY: "How many nosebleeds have you had in the last 24 hours?"      1 4. RECURRENT SYMPTOMS: "Have there been other recent nosebleeds?" If Yes, ask: "How long did it take you to stop the bleeding?" "What worked best?"      Yes- 2-3 weeks ago 5. CAUSE: "What do you think caused this nosebleed?"     Unsure 6. LOCAL FACTORS: "Do you have any cold symptoms?", "Have you been rubbing or picking at your nose?"     No 7. SYSTEMIC FACTORS: "Do you have high blood pressure or any bleeding problems?"     No 8. BLOOD THINNERS: "Do you take any blood thinners?" (e.g., aspirin, clopidogrel / Plavix, coumadin, heparin). Notes: Other strong blood thinners include: Arixtra (fondaparinux), Eliquis (apixaban), Pradaxa (dabigatran), and Xarelto (rivaroxaban).     Xarelto 9. OTHER SYMPTOMS: "Do you have any other symptoms?" (e.g., lightheadedness)     No 10. PREGNANCY: "Is there any chance you are pregnant?" "When was your last menstrual period?"       N/a  Protocols used: Nosebleed-A-AH

## 2021-05-09 NOTE — Telephone Encounter (Signed)
Pt.'s sister reports pt. Had a nose bleed this morning and called EMS. Bleeding has stopped. States vital signs were stable per EMS. Did not think pt. Needed to go to ED. Pt. On Xarelto. Had a small nose bleed 2-3 weeks ago. No sinus problems currently. Offered appointment, declines. Wants to know what PCP thinks. Please advise pt.

## 2021-05-09 NOTE — Telephone Encounter (Signed)
I called and spoke with the pt and discussed recommendations. He said that EMS gave him the same recommendations this morning and he has already gotten some Vaseline that is helping.

## 2021-05-12 ENCOUNTER — Ambulatory Visit
Admission: RE | Admit: 2021-05-12 | Discharge: 2021-05-12 | Disposition: A | Discharge status: Home / Self Care | Payer: Medicare PPO | Source: Ambulatory Visit | Attending: Family Medicine | Admitting: Family Medicine

## 2021-05-12 ENCOUNTER — Other Ambulatory Visit: Payer: Self-pay

## 2021-05-12 DIAGNOSIS — M4804 Spinal stenosis, thoracic region: Secondary | ICD-10-CM | POA: Diagnosis not present

## 2021-05-12 DIAGNOSIS — S22000A Wedge compression fracture of unspecified thoracic vertebra, initial encounter for closed fracture: Secondary | ICD-10-CM | POA: Insufficient documentation

## 2021-05-12 DIAGNOSIS — M40204 Unspecified kyphosis, thoracic region: Secondary | ICD-10-CM | POA: Diagnosis not present

## 2021-05-12 DIAGNOSIS — M5134 Other intervertebral disc degeneration, thoracic region: Secondary | ICD-10-CM | POA: Diagnosis not present

## 2021-05-12 DIAGNOSIS — M5124 Other intervertebral disc displacement, thoracic region: Secondary | ICD-10-CM | POA: Diagnosis not present

## 2021-05-12 IMAGING — MR MR THORACIC SPINE W/O CM
5 of 6 series · 30 of 48 positions shown · non-contrast
Comparison: None.

CLINICAL DATA: Prior fall with midthoracic back pain.

EXAM:
MRI THORACIC SPINE WITHOUT CONTRAST
TECHNIQUE: Multiplanar, multisequence MR imaging of the thoracic spine was
performed. No intravenous contrast was administered.

[Series 16: T1 · sagittal · 5.0mm · 1.88mm/px · 2 of 9 slices shown (1 of 2)]
[im 1/9]
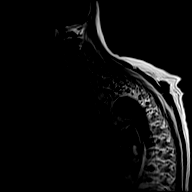
[im 9/9]
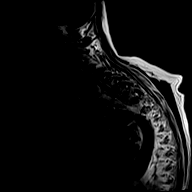

[Series 17: T2 · sagittal · 3.0mm · 1.06mm/px · 6 of 21 slices shown (1 of 2)]
[im 1/21]
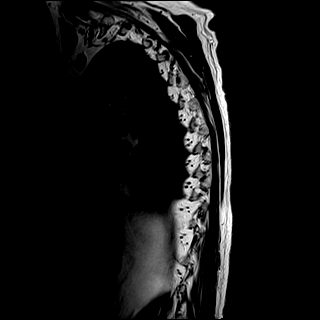
[im 5/21]
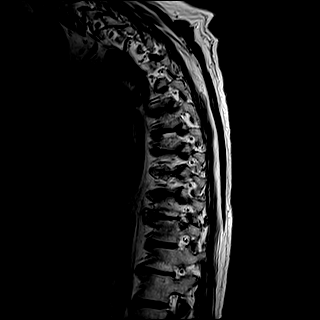
[im 9/21]
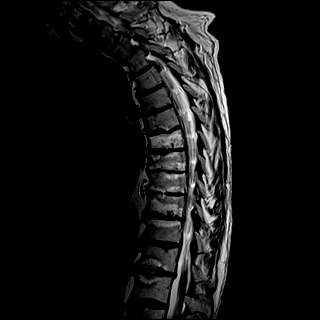
[im 13/21]
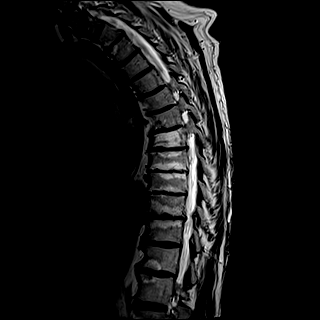
[im 17/21]
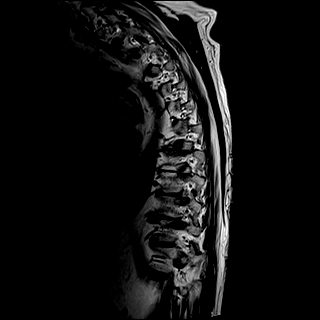
[im 21/21]
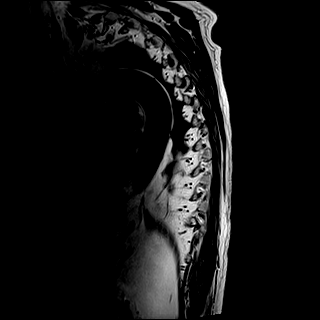

[Series 18: T1 · sagittal · 3.0mm · 1.06mm/px · 7 of 21 slices shown (2 of 2)]
[im 1/21]
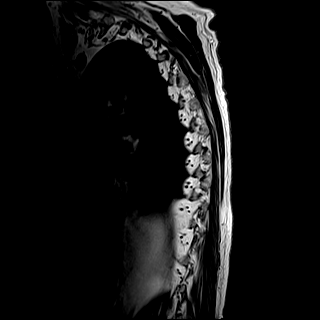
[im 4/21]
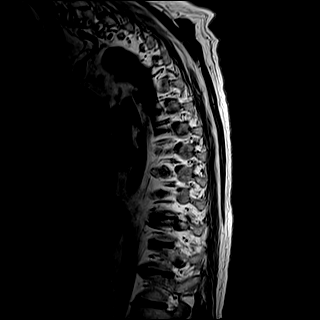
[im 7/21]
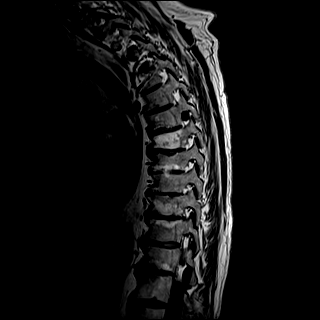
[im 11/21]
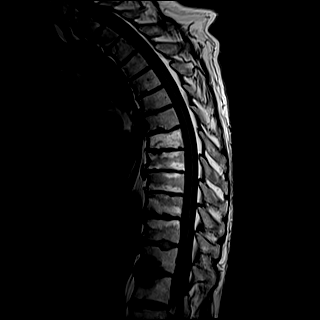
[im 14/21]
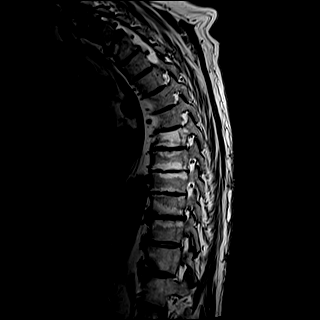
[im 17/21]
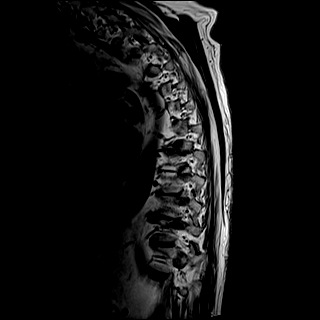
[im 21/21]
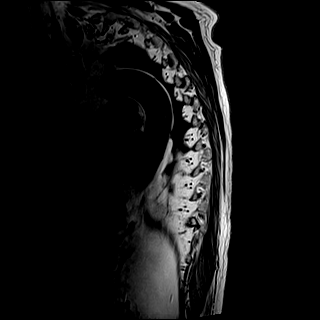

[Series 19: STIR · sagittal · 3.0mm · 0.53mm/px · 6 of 21 slices shown]
[im 1/21]
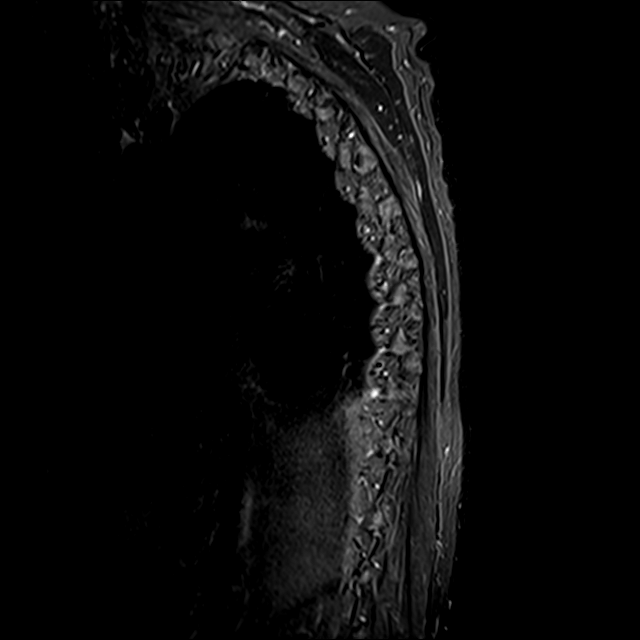
[im 4/21]
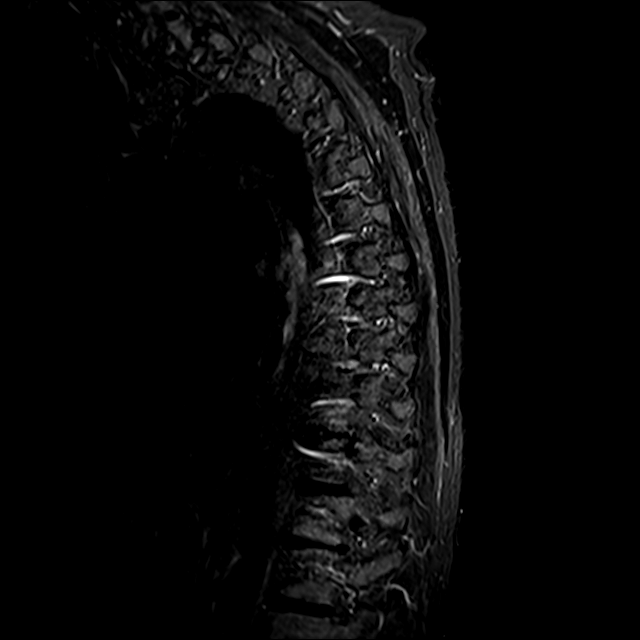
[im 7/21]
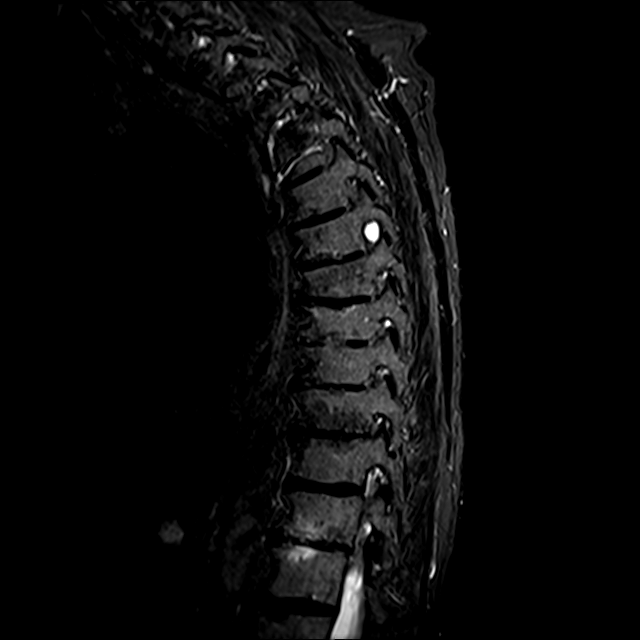
[im 11/21]
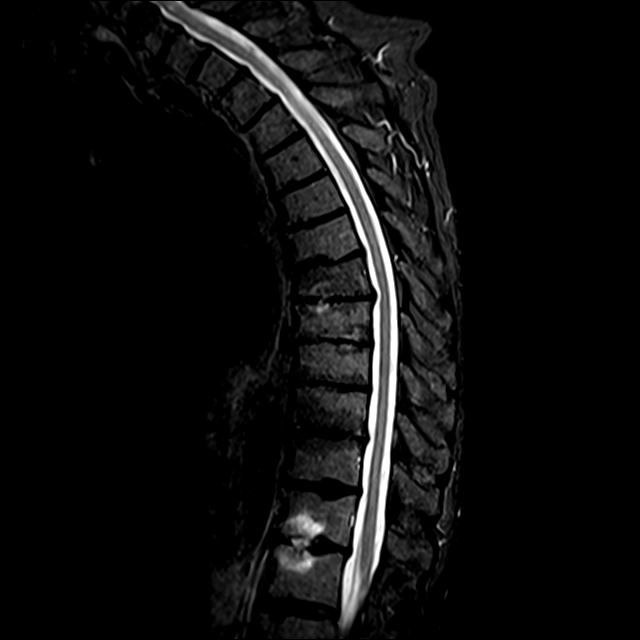
[im 14/21]
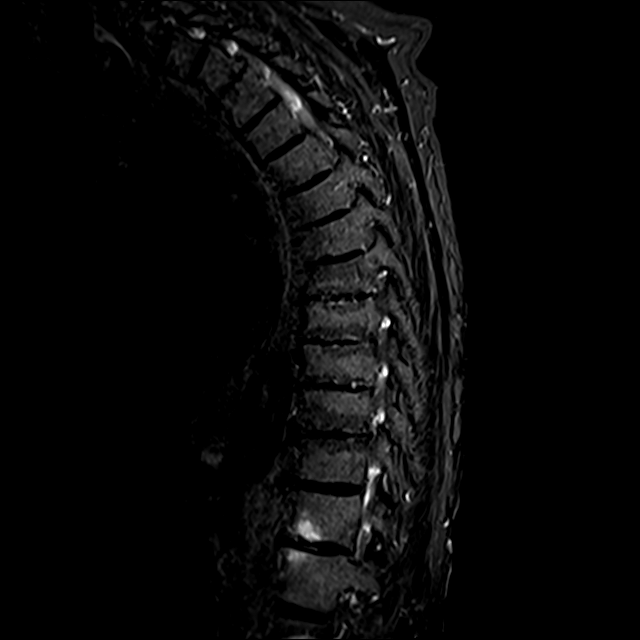
[im 17/21]
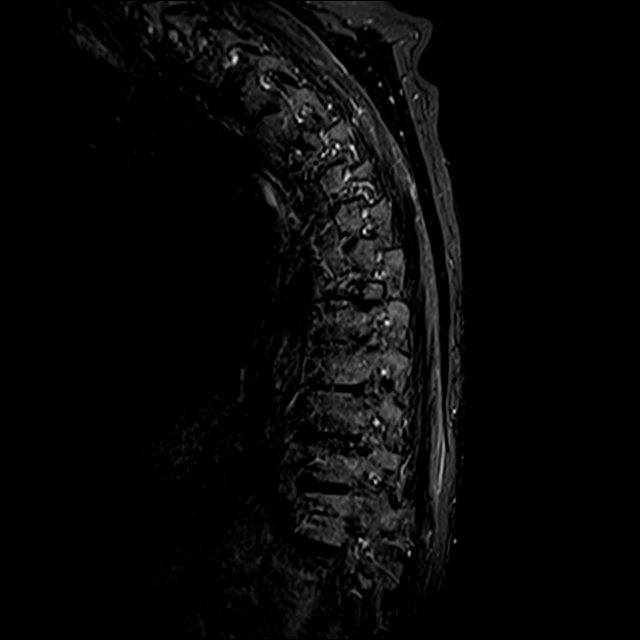

[Series 20: T2 · axial · 4.0mm · 0.59mm/px · z∈[-335,-1]mm · 9 of 40 slices shown (2 of 2)]
[im 1/40]
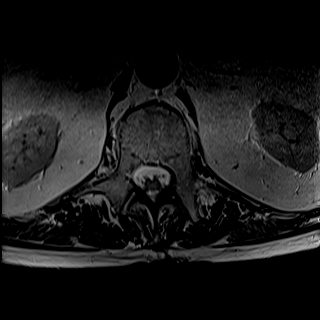
[im 7/40]
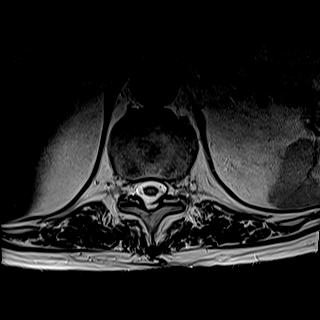
[im 14/40]
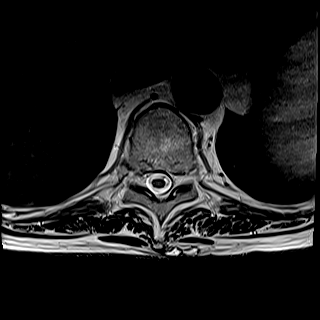
[im 17/40]
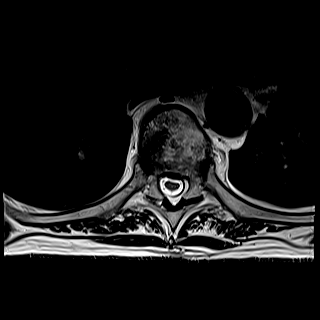
[im 20/40]
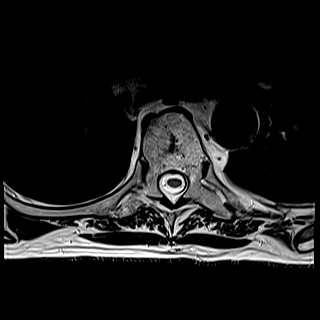
[im 23/40]
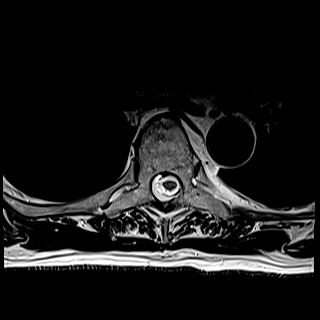
[im 27/40]
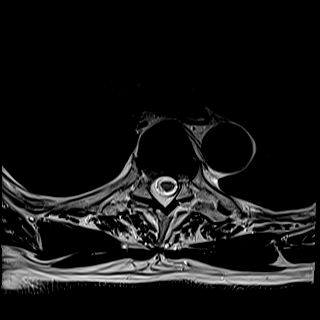
[im 33/40]
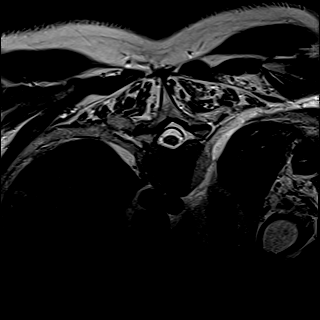
[im 40/40]
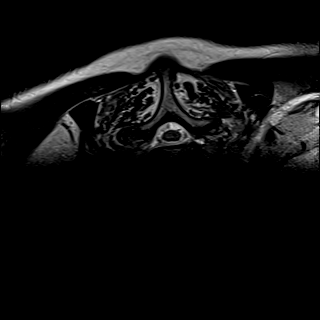

[30 of 48 positions shown; findings below may reference images not displayed]

FINDINGS: Alignment: Exaggerated thoracic kyphosis. No substantial sagittal
subluxation.

Vertebrae: Approximately 30% height loss of the T7 vertebral body
anteriorly. No associated marrow edema. Focal marrow edema about the
T12-L1 disc space anteriorly, compatible with
degenerative/discogenic endplate change and possible active
Schmorl's nodes. Moderate to severe degenerative disc height loss
and more chronic appearing endplate signal changes throughout the
mid to lower thoracic spine.

Cord:  Normal cord signal.

Paraspinal and other soft tissues: Prior Nissen fundoplication
partially herniated into the chest, better characterized on prior CT
abdomen/pelvis from [DATE]. no paraspinal edema.

Disc levels:

Degenerative disc/endplate findings are described above. At T7-T8 a
small to moderate central disc herniation contacts and flattens the
ventral cord with mild canal stenosis. Small right eccentric disc
protrusion at T6-T7 also flattens the cord without significant canal
stenosis. Additional multilevel small disc herniations without
significant canal stenosis. Perineural root sleeve cysts at multiple
levels, largest on the right at T6-T7. Multilevel facet hypertrophy
with mild to moderate bilateral foraminal stenosis at T10-T11.
Additional multilevel mild foraminal stenosis.
IMPRESSION: 1. Approximately 30% height loss of the T7 vertebral body
anteriorly, likely the sequela of prior compression fracture and/or
degenerative remodeling. No associated marrow edema to suggest
recent/unhealed fracture.
2. Focal marrow edema about the T12-L1 disc space anteriorly,
compatible with degenerative/discogenic endplate change and possible
inflamed Schmorl's nodes.
3. Moderate to severe degenerative disc height loss and more chronic
appearing endplate signal changes throughout the mid to lower
thoracic spine.
4. At T7-T8 a small to moderate central disc herniation contacts and
flattens the ventral cord with mild canal stenosis. Small right
eccentric disc protrusion at T6-T7 also flattens the ventral cord
without significant canal stenosis.
5. Mild-to-moderate bilateral foraminal stenosis at T10-T11 with
multilevel mild foraminal stenosis.

## 2021-05-13 ENCOUNTER — Ambulatory Visit (INDEPENDENT_AMBULATORY_CARE_PROVIDER_SITE_OTHER): Payer: Medicare PPO

## 2021-05-13 VITALS — Ht 75.0 in | Wt 188.0 lb

## 2021-05-13 DIAGNOSIS — Z0000 Encounter for general adult medical examination without abnormal findings: Secondary | ICD-10-CM | POA: Diagnosis not present

## 2021-05-13 NOTE — Progress Notes (Signed)
I connected with Arthur Romero today by telephone and verified that I am speaking with the correct person using two identifiers. Location patient: home Location provider: work Persons participating in the virtual visit: Arthur Romero, Arthur Durand LPN.   I discussed the limitations, risks, security and privacy concerns of performing an evaluation and management service by telephone and the availability of in person appointments. I also discussed with the patient that there may be a patient responsible charge related to this service. The patient expressed understanding and verbally consented to this telephonic visit.    Interactive audio and video telecommunications were attempted between this provider and patient, however failed, due to patient having technical difficulties OR patient did not have access to video capability.  We continued and completed visit with audio only.     Vital signs may be patient reported or missing.  Subjective:   Arthur Romero is a 80 y.o. male who presents for an Initial Medicare Annual Wellness Visit.  Review of Systems     Cardiac Risk Factors include: advanced age (>60men, >50 women);dyslipidemia;hypertension;male gender     Objective:    Today's Vitals   05/13/21 1016  Weight: 188 lb (85.3 kg)  Height: 6\' 3"  (1.905 m)   Body mass index is 23.5 kg/m.  Advanced Directives 05/13/2021 02/17/2021 07/09/2020 07/07/2020 07/06/2020 05/18/2020 04/19/2020  Does Patient Have a Medical Advance Directive? Yes No No Yes Yes No No  Type of Paramedic of [place];Living will - - - [place] - -  Does patient want to make changes to medical advance directive? - - - No - Patient declined - - -  Copy of [place] in Chart? No - copy requested - - - - - -  Would patient like information on creating a medical advance directive? - No - Patient declined - - No - Patient declined No - Patient declined No -  Patient declined    Current Medications (verified) Outpatient Encounter Medications as of 05/13/2021  Medication Sig   acetaminophen (TYLENOL) 500 MG tablet Take 500-1,000 mg by mouth every 6 (six) hours as needed for mild pain or fever.    allopurinol (ZYLOPRIM) 300 MG tablet TAKE 1 TABLET EVERY DAY   atorvastatin (LIPITOR) 40 MG tablet TAKE 1 TABLET EVERY DAY   Cholecalciferol (VITAMIN D3 PO) Take by mouth daily.   fluticasone (FLONASE) 50 MCG/ACT nasal spray Place 2 sprays into both nostrils daily. Use for 4-6 weeks then stop and use seasonally or as needed.   gabapentin (NEURONTIN) 100 MG capsule TAKE 1 CAPSULE IN THE MORNING, 1 CAPSULE IN THE AFTERNOON, AND 2 CAPSULES AT BEDTIME.   levothyroxine (SYNTHROID) 88 MCG tablet TAKE 1 TABLET (88 MCG TOTAL) BY MOUTH DAILY.   melatonin 3 MG TABS tablet Take 3 mg by mouth at bedtime as needed (sleep).   montelukast (SINGULAIR) 10 MG tablet Take 1 tablet (10 mg total) by mouth at bedtime.   omeprazole (PRILOSEC) 40 MG capsule Take 1 capsule (40 mg total) by mouth daily.   polyethylene glycol (MIRALAX / GLYCOLAX) 17 g packet Take 17 g by mouth in the morning and at bedtime.   rivaroxaban (XARELTO) 20 MG TABS tablet Take 1 tablet (20 mg total) by mouth daily with supper.   diltiazem (CARDIZEM CD) 180 MG 24 hr capsule Take 1 capsule (180 mg total) by mouth daily. (Patient not taking: Reported on 05/13/2021)   levofloxacin (LEVAQUIN) 500 MG tablet Take 1 tablet (500  mg total) by mouth daily. For 7 days (Patient not taking: Reported on 05/13/2021)   No facility-administered encounter medications on file as of 05/13/2021.    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[place])    Thyroid disease    Past Surgical History:  Procedure Laterality Date   CARDIAC SURGERY     COLONOSCOPY N/A 07/09/2020    Procedure: COLONOSCOPY;  Surgeon: Jonathon Bellows, MD;  [facility]: [facility] ENDOSCOPY;  Service: Gastroenterology;  Laterality: N/A;   ESOPHAGOGASTRODUODENOSCOPY N/A 07/09/2020   Procedure: ESOPHAGOGASTRODUODENOSCOPY (EGD);  Surgeon: Jonathon Bellows, MD;  Location: [facility] ENDOSCOPY;  Service: Gastroenterology;  Laterality: N/A;   SHOULDER SURGERY Right 2016   TYMPANOPLASTY Right 1987   vocal cords  1989   polyps   Family History  Problem Relation Age of Onset   Hypertension Mother    Osteoarthritis Mother    Parkinson's disease Mother    Heart attack Father 35   Tuberculosis Father    Stroke Brother    Diabetes Brother    Social History   Socioeconomic History   Marital status: Single    Spouse name: Not on file   Number of children: Not on file   Years of education: high school   Highest education level: High school graduate  Occupational History   Not on file  Tobacco Use   Smoking status: Never   Smokeless tobacco: Never  Vaping Use   Vaping Use: Never used  Substance and Sexual Activity   Alcohol use: Never    Alcohol/week: 1.0 standard drink    Types: 1 Glasses of wine per week   Drug use: Never   Sexual activity: Not on file  Other Topics Concern   Not on file  Social History Narrative   Not on file   Social Determinants of Health   Financial Resource Strain: Low Risk    Difficulty of Paying Living Expenses: Not hard at all  Food Insecurity: No Food Insecurity   Worried About Charity fundraiser in the Last Year: Never true   [place] in the Last Year: Never true  Transportation Needs: No Transportation Needs   Lack of Transportation (Medical): No   Lack of Transportation (Non-Medical): No  Physical Activity: Insufficiently Active   Days of Exercise per Week: 7 days   Minutes of Exercise per Session: 20 min  Stress: No Stress Concern Present   Feeling of Stress : Not at all  Social Connections: Not on file    Tobacco Counseling Counseling given: Not  Answered   Clinical Intake:  Pre-visit preparation completed: Yes  Pain : No/denies pain     Nutritional Status: BMI of 19-24  Normal Nutritional Risks: None Diabetes: No  How often do you need to have someone help you when you read instructions, pamphlets, or other written materials from your doctor or pharmacy?: 1 - Never What is the last grade level you completed in school?: 12th grade  Diabetic? no  Interpreter Needed?: No  Information entered by :: NAllen LPN   Activities of Daily Living In your present state of health, do you have any difficulty performing the following activities: 05/13/2021 03/10/2021  Hearing? Y Y  Comment decreased in right ear -  Vision? N N  Difficulty concentrating or making decisions? N N  Walking or climbing stairs? Aggie Moats  Comment - -  Dressing or bathing? N N  Doing errands, shopping? N Y  Comment - -  Conservation officer, nature and eating ? N -  Using the Toilet? N -  In the past six months, have you accidently leaked urine? N -  Do you have problems with loss of bowel control? N -  Managing your Medications? N -  Managing your Finances? N -  Housekeeping or managing your Housekeeping? N -  Some recent data might be hidden    Patient Care Team: Olin Hauser, DO as PCP - General (Family Medicine) Rockey Situ Kathlene November, MD as PCP - Cardiology (Cardiology) Flor[place], Tomasa Blase, RN as [place] any recent [place] you may have received from other than [facility] providers in the past year (date may be approximate).     Assessment:   This is a routine wellness examination for Arthur Romero.  Hearing/Vision screen Vision Screening - Comments:: No regular eye exams, [facility]  Dietary issues and exercise activities discussed: Current Exercise Habits: Home exercise routine, Type of exercise: stretching, Time (Minutes): 20, Frequency (Times/Week): 7, Weekly Exercise (Minutes/Week): 140    Goals Addressed             This Visit's Progress    Patient Stated       05/13/2021, no goals       Depression Screen PHQ 2/9 Scores 05/13/2021 03/10/2021 02/19/2021 02/17/2021 04/26/2020 03/15/2020  PHQ - 2 Score 0 1 0 0 0 0  PHQ- 9 Score - 1 0 - - -    Fall Risk Fall Risk  05/13/2021 03/10/2021 02/19/2021 02/17/2021 04/26/2020  Falls in the past year? 1 1 1 1  0  Comment lost balance - - - -  Number falls in past yr: 1 0 1 1 0  Injury with Fall? 0 0 0 1 0  Risk for fall due to : Medication side effect;Impaired balance/gait - History of fall(s) History of fall(s);Impaired balance/gait;Impaired mobility;Impaired vision;Medication side effect -  Follow up Falls evaluation completed;Education provided;Falls prevention discussed Falls evaluation completed Falls evaluation completed Falls evaluation completed;Education provided;Falls prevention discussed Falls evaluation completed    FALL RISK PREVENTION PERTAINING TO THE HOME:  Any stairs in or around the home? Yes  If so, are there any without handrails? No  Home free of loose throw rugs in walkways, pet beds, electrical cords, etc? Yes  Adequate lighting in your home to reduce risk of falls? Yes   ASSISTIVE DEVICES UTILIZED TO PREVENT FALLS:  Life alert? Yes  Use of a cane, walker or w/c? Yes  Grab bars in the bathroom? Yes  Shower chair or bench in shower? Yes  Elevated toilet seat or a handicapped toilet? Yes   TIMED UP AND GO:  Was the test performed? No .      Cognitive Function:     6CIT Screen 05/13/2021  What Year? 0 points  What month? 0 points  What time? 0 points  Count back from 20 0 points  Months in reverse 2 points  Repeat phrase 4 points  Total Score 6    Immunizations Immunization History  Administered Date(s) Administered   Fluad Quad(high Dose 65+) 07/29/2020   Influenza, Seasonal, Injecte, Preservative Fre 07/16/2010, 10/02/2011, 12/02/2012, 07/18/2013   Influenza,inj,Quad PF,6+ Mos 09/08/2016,  07/07/2017, 07/27/2018, 08/01/2019   Influenza-Unspecified 08/09/2014, 08/07/2015   Moderna Sars-Covid-2 Vaccination 12/13/2019, 01/10/2020   Pneumococcal Conjugate-13 09/15/2016   Pneumococcal Polysaccharide-23 07/16/2010, 08/22/2015   Tdap 06/05/2015  TDAP status: Up to date  Flu Vaccine status: Up to date  Pneumococcal vaccine status: Up to date  Covid-19 vaccine status: Completed vaccines  Qualifies for Shingles Vaccine? Yes   Zostavax completed No   Shingrix Completed?: No.    Education has been provided regarding the importance of this vaccine. Patient has been advised to call insurance company to determine out of pocket expense if they have not yet received this vaccine. Advised may also receive vaccine at local pharmacy or Health Dept. Verbalized acceptance and understanding.  Screening Tests Health Maintenance  Topic Date Due   Hepatitis C Screening  Never done   Zoster Vaccines- Shingrix (1 of 2) Never done   COVID-19 Vaccine (3 - Moderna risk series) 02/07/2020   INFLUENZA VACCINE  05/26/2021   TETANUS/TDAP  06/04/2025   PNA vac Low Risk Adult  Completed   HPV VACCINES  Aged Out    Health Maintenance  Health Maintenance Due  Topic Date Due   Hepatitis C Screening  Never done   Zoster Vaccines- Shingrix (1 of 2) Never done   COVID-19 Vaccine (3 - Moderna risk series) 02/07/2020    Colorectal cancer screening: No longer required.   Lung Cancer Screening: (Low Dose CT Chest recommended if Age 37-80 years, 30 pack-year currently smoking OR have quit w/in 15years.) does not qualify.   Lung Cancer Screening Referral: no  Additional Screening:  Hepatitis C Screening: does qualify; due  Vision Screening: Recommended annual ophthalmology exams for early detection of glaucoma and other disorders of the eye. Is the patient up to date with their annual eye exam?  No  Who is the provider or what is the name of the office in which the patient attends annual eye  exams? [facility] If pt is not established with a provider, would they like to be referred to a provider to establish care? No .   Dental Screening: Recommended annual dental exams for proper oral hygiene  Community Resource Referral / Chronic Care Management: CRR required this visit?  No   CCM required this visit?  No      Plan:     I have personally reviewed and noted the following in the patient's chart:   Medical and social history Use of alcohol, tobacco or illicit drugs  Current medications and supplements including opioid prescriptions. Patient is not currently taking opioid prescriptions. Functional ability and status Nutritional status Physical activity Advanced directives List of other physicians Hospitalizations, surgeries, and ER visits in previous 12 months Vitals Screenings to include cognitive, depression, and falls Referrals and appointments  In addition, I have reviewed and discussed with patient certain preventive protocols, quality metrics, and best practice recommendations. A written personalized care plan for preventive services as well as general preventive health recommendations were provided to patient.     Kellie Simmering, LPN   [id number]   Nurse Notes:

## 2021-05-13 NOTE — Patient Instructions (Signed)
Arthur Romero , Thank you for taking time to come for your Medicare Wellness Visit. I appreciate your ongoing commitment to your health goals. Please review the following plan we discussed and let me know if I can assist you in the future.   Screening recommendations/referrals: Colonoscopy: not required Recommended yearly ophthalmology/optometry visit for glaucoma screening and checkup Recommended yearly dental visit for hygiene and checkup  Vaccinations: Influenza vaccine: completed 07/29/2020, due 05/26/2021 Pneumococcal vaccine: completed 09/15/2016 Tdap vaccine: completed 06/05/2015, due 06/04/2025 Shingles vaccine: discussed   Covid-19:  01/10/2020, 12/13/2019  Advanced directives: Please bring a copy of your POA (Power of Attorney) and/or Living Will to your next appointment.   Conditions/risks identified: none  Next appointment: Follow up in one year for your annual wellness visit.   Preventive Care 66 Years and Older, Male Preventive care refers to lifestyle choices and visits with your health care provider that can promote health and wellness. What does preventive care include? A yearly physical exam. This is also called an annual well check. Dental exams once or twice a year. Routine eye exams. Ask your health care provider how often you should have your eyes checked. Personal lifestyle choices, including: Daily care of your teeth and gums. Regular physical activity. Eating a healthy diet. Avoiding tobacco and drug use. Limiting alcohol use. Practicing safe sex. Taking low doses of aspirin every day. Taking vitamin and mineral supplements as recommended by your health care provider. What happens during an annual well check? The services and screenings done by your health care provider during your annual well check will depend on your age, overall health, lifestyle risk factors, and family history of disease. Counseling  Your health care provider may ask you questions about  your: Alcohol use. Tobacco use. Drug use. Emotional well-being. Home and relationship well-being. Sexual activity. Eating habits. History of falls. Memory and ability to understand (cognition). Work and work Statistician. Screening  You may have the following tests or measurements: Height, weight, and BMI. Blood pressure. Lipid and cholesterol levels. These may be checked every 5 years, or more frequently if you are over 49 years old. Skin check. Lung cancer screening. You may have this screening every year starting at age 78 if you have a 30-pack-year history of smoking and currently smoke or have quit within the past 15 years. Fecal occult blood test (FOBT) of the stool. You may have this test every year starting at age 78. Flexible sigmoidoscopy or colonoscopy. You may have a sigmoidoscopy every 5 years or a colonoscopy every 10 years starting at age 78. Prostate cancer screening. Recommendations will vary depending on your family history and other risks. Hepatitis C blood test. Hepatitis B blood test. Sexually transmitted disease (STD) testing. Diabetes screening. This is done by checking your blood sugar (glucose) after you have not eaten for a while (fasting). You may have this done every 1-3 years. Abdominal aortic aneurysm (AAA) screening. You may need this if you are a current or former smoker. Osteoporosis. You may be screened starting at age 78 if you are at high risk. Talk with your health care provider about your test results, treatment options, and if necessary, the need for more tests. Vaccines  Your health care provider may recommend certain vaccines, such as: Influenza vaccine. This is recommended every year. Tetanus, diphtheria, and acellular pertussis (Tdap, Td) vaccine. You may need a Td booster every 10 years. Zoster vaccine. You may need this after age 78. Pneumococcal 13-valent conjugate (PCV13) vaccine. One dose is  recommended after age 78. Pneumococcal  polysaccharide (PPSV23) vaccine. One dose is recommended after age 78. Talk to your health care provider about which screenings and vaccines you need and how often you need them. This information is not intended to replace advice given to you by your health care provider. Make sure you discuss any questions you have with your health care provider. Document Released: 11/08/2015 Document Revised: 07/01/2016 Document Reviewed: 08/13/2015 Elsevier Interactive Patient Education  2017 [place] Prevention in the Home Falls can cause injuries. They can happen to people of all ages. There are many things you can do to make your home safe and to help prevent falls. What can I do on the outside of my home? Regularly fix the edges of walkways and driveways and fix any cracks. Remove anything that might make you trip as you walk through a door, such as a raised step or threshold. Trim any bushes or trees on the path to your home. Use bright outdoor lighting. Clear any walking paths of anything that might make someone trip, such as rocks or tools. Regularly check to see if handrails are loose or broken. Make sure that both sides of any steps have handrails. Any raised decks and porches should have guardrails on the edges. Have any leaves, snow, or ice cleared regularly. Use sand or salt on walking paths during winter. Clean up any spills in your garage right away. This includes oil or grease spills. What can I do in the bathroom? Use night lights. Install grab bars by the toilet and in the tub and shower. Do not use towel bars as grab bars. Use non-skid mats or decals in the tub or shower. If you need to sit down in the shower, use a plastic, non-slip stool. Keep the floor dry. Clean up any water that spills on the floor as soon as it happens. Remove soap buildup in the tub or shower regularly. Attach bath mats securely with double-sided non-slip rug tape. Do not have throw rugs and other  things on the floor that can make you trip. What can I do in the bedroom? Use night lights. Make sure that you have a light by your bed that is easy to reach. Do not use any sheets or blankets that are too big for your bed. They should not hang down onto the floor. Have a firm chair that has side arms. You can use this for support while you get dressed. Do not have throw rugs and other things on the floor that can make you trip. What can I do in the kitchen? Clean up any spills right away. Avoid walking on wet floors. Keep items that you use a lot in easy-to-reach places. If you need to reach something above you, use a strong step stool that has a grab bar. Keep electrical cords out of the way. Do not use floor polish or wax that makes floors slippery. If you must use wax, use non-skid floor wax. Do not have throw rugs and other things on the floor that can make you trip. What can I do with my stairs? Do not leave any items on the stairs. Make sure that there are handrails on both sides of the stairs and use them. Fix handrails that are broken or loose. Make sure that handrails are as long as the stairways. Check any carpeting to make sure that it is firmly attached to the stairs. Fix any carpet that is loose or worn. Avoid having  throw rugs at the top or bottom of the stairs. If you do have throw rugs, attach them to the floor with carpet tape. Make sure that you have a light switch at the top of the stairs and the bottom of the stairs. If you do not have them, ask someone to add them for you. What else can I do to help prevent falls? Wear shoes that: Do not have high heels. Have rubber bottoms. Are comfortable and fit you well. Are closed at the toe. Do not wear sandals. If you use a stepladder: Make sure that it is fully opened. Do not climb a closed stepladder. Make sure that both sides of the stepladder are locked into place. Ask someone to hold it for you, if possible. Clearly  mark and make sure that you can see: Any grab bars or handrails. First and last steps. Where the edge of each step is. Use tools that help you move around (mobility aids) if they are needed. These include: Canes. Walkers. Scooters. Crutches. Turn on the lights when you go into a dark area. Replace any light bulbs as soon as they burn out. Set up your furniture so you have a clear path. Avoid moving your furniture around. If any of your floors are uneven, fix them. If there are any pets around you, be aware of where they are. Review your medicines with your doctor. Some medicines can make you feel dizzy. This can increase your chance of falling. Ask your doctor what other things that you can do to help prevent falls. This information is not intended to replace advice given to you by your health care provider. Make sure you discuss any questions you have with your health care provider. Document Released: 08/08/2009 Document Revised: 03/19/2016 Document Reviewed: 11/16/2014 Elsevier Interactive Patient Education  2017 [employer].

## 2021-05-15 DIAGNOSIS — H9319 Tinnitus, unspecified ear: Secondary | ICD-10-CM | POA: Diagnosis not present

## 2021-05-15 DIAGNOSIS — I480 Paroxysmal atrial fibrillation: Secondary | ICD-10-CM | POA: Diagnosis not present

## 2021-05-15 DIAGNOSIS — I10 Essential (primary) hypertension: Secondary | ICD-10-CM | POA: Diagnosis not present

## 2021-05-15 DIAGNOSIS — K219 Gastro-esophageal reflux disease without esophagitis: Secondary | ICD-10-CM | POA: Diagnosis not present

## 2021-05-15 DIAGNOSIS — R5381 Other malaise: Secondary | ICD-10-CM | POA: Diagnosis not present

## 2021-05-15 DIAGNOSIS — J3489 Other specified disorders of nose and nasal sinuses: Secondary | ICD-10-CM | POA: Diagnosis not present

## 2021-05-15 DIAGNOSIS — Z7901 Long term (current) use of anticoagulants: Secondary | ICD-10-CM | POA: Diagnosis not present

## 2021-05-15 DIAGNOSIS — R040 Epistaxis: Secondary | ICD-10-CM | POA: Diagnosis not present

## 2021-05-15 DIAGNOSIS — J029 Acute pharyngitis, unspecified: Secondary | ICD-10-CM | POA: Diagnosis not present

## 2021-05-15 DIAGNOSIS — R531 Weakness: Secondary | ICD-10-CM | POA: Diagnosis not present

## 2021-05-15 DIAGNOSIS — R519 Headache, unspecified: Secondary | ICD-10-CM | POA: Diagnosis not present

## 2021-05-15 DIAGNOSIS — Z20822 Contact with and (suspected) exposure to covid-19: Secondary | ICD-10-CM | POA: Diagnosis not present

## 2021-05-15 DIAGNOSIS — I440 Atrioventricular block, first degree: Secondary | ICD-10-CM | POA: Diagnosis not present

## 2021-05-15 DIAGNOSIS — R0981 Nasal congestion: Secondary | ICD-10-CM | POA: Diagnosis not present

## 2021-05-20 ENCOUNTER — Ambulatory Visit: Payer: Medicare PPO | Admitting: Family Medicine

## 2021-05-20 ENCOUNTER — Other Ambulatory Visit: Payer: Self-pay

## 2021-05-20 ENCOUNTER — Encounter: Payer: Self-pay | Admitting: Family Medicine

## 2021-05-20 VITALS — BP 131/72 | HR 76 | Ht 75.0 in | Wt 173.2 lb

## 2021-05-20 DIAGNOSIS — I480 Paroxysmal atrial fibrillation: Secondary | ICD-10-CM

## 2021-05-20 DIAGNOSIS — R040 Epistaxis: Secondary | ICD-10-CM | POA: Diagnosis not present

## 2021-05-20 MED ORDER — RIVAROXABAN 15 MG PO TABS
15.0000 mg | ORAL_TABLET | Freq: Every day | ORAL | 2 refills | Status: DC
Start: 2021-05-20 — End: 2021-08-15

## 2021-05-20 NOTE — Progress Notes (Signed)
Subjective:    Patient ID: Arthur Romero, male    DOB: 1941/04/29, 80 y.o.   MRN: [id number]  Arthur Romero is a 80 y.o. male presenting on 05/20/2021 for Sinusitis, Epistaxis, and Headache   HPI  ED FOLLOW-UP VISIT  Hospital/Location: [facility] Date of ED Visit: 05/15/21  Reason for Presenting to ED: Headache, Nosebleed  FOLLOW-UP  - ED provider note and record have been reviewed - Patient presents today about 5 days after recent ED visit. Brief summary of recent course, patient had symptoms of headache, malaise, epistaxis repeated episodes lsating 30 min, last episode 7/21 at that time, tried saline rinse, and also admits tinnitus. Presented to ED on 05/15/21, testing in ED with Head CT and labs EKG, mild elevated Potassium. His nose stopped bleeding and he was discharged  - Today reports overall has done well after discharge from ED. Symptoms of headache still occurring and still having symptoms with ear fullness pressure and sinus pressure. - He still had some bleeding  Still has sinus pressure  He has seen ENT already in past limited success Cannot take sinus spray medications due to nose bleeding  I have reviewed the discharge medication list, and have reconciled the current and discharge medications today.    Depression screen [facility] 2/9 05/13/2021 03/10/2021 02/19/2021  Decreased Interest 0 1 0  Down, Depressed, Hopeless 0 0 0  PHQ - 2 Score 0 1 0  Altered sleeping - 0 0  Tired, decreased energy - 0 0  Change in appetite - 0 0  Feeling bad or failure about yourself  - 0 0  Trouble concentrating - 0 0  Moving slowly or fidgety/restless - 0 0  Suicidal thoughts - 0 0  PHQ-9 Score - 1 0  Difficult doing work/chores - - Not difficult at all    Social History   Tobacco Use   Smoking status: Never   Smokeless tobacco: Never  Vaping Use   Vaping Use: Never used  Substance Use Topics   Alcohol use: Never    Alcohol/week: 1.0 standard drink    Types: 1  Glasses of wine per week   Drug use: Never    Review of Systems Per HPI unless specifically indicated above     Objective:    BP 131/72   Pulse 76   Ht '6\' 3"'$  (1.905 m)   Wt 173 lb 3.2 oz (78.6 kg)   SpO2 95%   BMI 21.65 kg/m   Wt Readings from Last 3 Encounters:  05/20/21 173 lb 3.2 oz (78.6 kg)  05/13/21 188 lb (85.3 kg)  02/19/21 174 lb (78.9 kg)    Physical Exam Vitals and nursing note reviewed.  Constitutional:      General: He is not in acute distress.    Appearance: Normal appearance. He is well-developed. He is not diaphoretic.     Comments: Well-appearing, comfortable, cooperative  HENT:     Head: Normocephalic and atraumatic.  Eyes:     General:        Right eye: No discharge.        Left eye: No discharge.     Conjunctiva/sclera: Conjunctivae normal.  Cardiovascular:     Rate and Rhythm: Normal rate.  Pulmonary:     Effort: Pulmonary effort is normal.  Skin:    General: Skin is warm and dry.     Findings: No erythema or rash.  Neurological:     Mental Status: He is alert and oriented to  person, place, and time.  Psychiatric:        Mood and Affect: Mood normal.        Behavior: Behavior normal.        Thought Content: Thought content normal.     Comments: Well groomed, good eye contact, normal speech and thoughts   Results for orders placed or performed in visit on 12/06/20  POCT HgB A1C  Result Value Ref Range   Hemoglobin A1C 6.1 (A) 4.0 - 5.6 %      Assessment & Plan:   Problem List Items Addressed This Visit     Paroxysmal atrial fibrillation ([place]) - Primary   Relevant Medications   Rivaroxaban (XARELTO) 15 MG TABS tablet   Other Visit Diagnoses     Epistaxis           Clinically with recurrent epistaxis, secondary to anticoagulation Chronic rhinosinusitis contributing  PAF, without evidence of recent AFib. No longer w/ Cardiology History of CVA, on secondary stroke risk reduction anticoagulation, cardiology last time in 2021  recommended continue as tolerated, has had GIB as well due to Xarelto  Trial today on lower dose indicated at '15mg'$ , this is renal dosing, however his renal function is normal. Review of UpToDate literature demonstrates dosage range from 15-'20mg'$  for this indication secondary stroke risk reduction.  Discussed that this is not the common dose, and '20mg'$  dosage is preferred. Otherwise, we can see if reduced epistaxis on '15mg'$  dosing, if no improvement in epistaxis, then we can resume '20mg'$  until he can be switched to other agent such as Eliquis, that has other dosing regimens.  Otherwise can return to Cards/Neuro in future to discuss further.  Ultimately if risk vs benefit review, he may decide to come off anticoagulation and trial anti platelet, would defer this conversion to neuro/cards at this time.  Follow-up as planned., notify office if want to switch to Eliquis in future.  Meds ordered this encounter  Medications   Rivaroxaban (XARELTO) 15 MG TABS tablet    Sig: Take 1 tablet (15 mg total) by mouth daily with supper.    Dispense:  30 tablet    Refill:  2    Stop Xarelto '20mg'$ , switch to '15mg'$  dosing.     Follow up plan: Return if symptoms worsen or fail to improve.   [name], [facility] 05/20/2021, 10:09 AM

## 2021-05-20 NOTE — Patient Instructions (Addendum)
Thank you for coming to the office today.  Switch to Xarelto '15mg'$  nightly with meal, instead of '20mg'$ . Sent new order.  If still bleeding we will switch to Eliquis.  May consult Cardiology/Neuro again in future if need.  Please schedule a Follow-up Appointment to: Return if symptoms worsen or fail to improve.  If you have any other questions or concerns, please feel free to call the office or send a message through [place]. You may also schedule an earlier appointment if necessary.  Additionally, you may be receiving a survey about your experience at our office within a few days to 1 week by e-mail or mail. We value your feedback.  [name], DO [place]

## 2021-06-02 ENCOUNTER — Other Ambulatory Visit: Payer: Medicare PPO

## 2021-06-02 DIAGNOSIS — E039 Hypothyroidism, unspecified: Secondary | ICD-10-CM

## 2021-06-02 DIAGNOSIS — E559 Vitamin D deficiency, unspecified: Secondary | ICD-10-CM

## 2021-06-02 DIAGNOSIS — E782 Mixed hyperlipidemia: Secondary | ICD-10-CM

## 2021-06-02 DIAGNOSIS — I10 Essential (primary) hypertension: Secondary | ICD-10-CM | POA: Diagnosis not present

## 2021-06-02 DIAGNOSIS — Z0000 Encounter for general adult medical examination without abnormal findings: Secondary | ICD-10-CM | POA: Diagnosis not present

## 2021-06-02 DIAGNOSIS — I480 Paroxysmal atrial fibrillation: Secondary | ICD-10-CM

## 2021-06-02 DIAGNOSIS — R351 Nocturia: Secondary | ICD-10-CM | POA: Diagnosis not present

## 2021-06-02 DIAGNOSIS — R7309 Other abnormal glucose: Secondary | ICD-10-CM

## 2021-06-02 DIAGNOSIS — E538 Deficiency of other specified B group vitamins: Secondary | ICD-10-CM

## 2021-06-03 ENCOUNTER — Other Ambulatory Visit: Payer: Self-pay

## 2021-06-03 LAB — COMPLETE METABOLIC PANEL WITH GFR
AG Ratio: 1.8 (calc) (ref 1.0–2.5)
ALT: 15 U/L (ref 9–46)
AST: 17 U/L (ref 10–35)
Albumin: 4.4 g/dL (ref 3.6–5.1)
Alkaline phosphatase (APISO): 80 U/L (ref 35–144)
BUN: 15 mg/dL (ref 7–25)
CO2: 29 mmol/L (ref 20–32)
Calcium: 9.8 mg/dL (ref 8.6–10.3)
Chloride: 102 mmol/L (ref 98–110)
Creat: 0.97 mg/dL (ref 0.70–1.28)
Globulin: 2.5 g/dL (calc) (ref 1.9–3.7)
Glucose, Bld: 86 mg/dL (ref 65–99)
Potassium: 4.6 mmol/L (ref 3.5–5.3)
Sodium: 141 mmol/L (ref 135–146)
Total Bilirubin: 0.6 mg/dL (ref 0.2–1.2)
Total Protein: 6.9 g/dL (ref 6.1–8.1)
eGFR: 80 mL/min/{1.73_m2} (ref >=60)

## 2021-06-03 LAB — CBC WITH DIFFERENTIAL/PLATELET
Absolute Monocytes: 789 cells/uL (ref 200–950)
Basophils Absolute: 48 cells/uL (ref 0–200)
Basophils Relative: 0.5 %
Eosinophils Absolute: 219 cells/uL (ref 15–500)
Eosinophils Relative: 2.3 %
HCT: 44.2 % (ref 38.5–50.0)
Hemoglobin: 14.6 g/dL (ref 13.2–17.1)
Lymphs Abs: 2147 cells/uL (ref 850–3900)
MCH: 31.7 pg (ref 27.0–33.0)
MCHC: 33 g/dL (ref 32.0–36.0)
MCV: 95.9 fL (ref 80.0–100.0)
MPV: 9.4 fL (ref 7.5–12.5)
Monocytes Relative: 8.3 %
Neutro Abs: 6299 cells/uL (ref 1500–7800)
Neutrophils Relative %: 66.3 %
Platelets: 274 10*3/uL (ref 140–400)
RBC: 4.61 10*6/uL (ref 4.20–5.80)
RDW: 12.9 % (ref 11.0–15.0)
Total Lymphocyte: 22.6 %
WBC: 9.5 10*3/uL (ref 3.8–10.8)

## 2021-06-03 LAB — PSA: PSA: 0.45 ng/mL (ref <=4.00)

## 2021-06-03 LAB — TSH: TSH: 1.71 mIU/L (ref 0.40–4.50)

## 2021-06-03 LAB — LIPID PANEL
Cholesterol: 131 mg/dL (ref <200)
HDL: 62 mg/dL (ref >=40)
LDL Cholesterol (Calc): 52 mg/dL (calc)
Non-HDL Cholesterol (Calc): 69 mg/dL (calc) (ref <130)
Total CHOL/HDL Ratio: 2.1 (calc) (ref <5.0)
Triglycerides: 88 mg/dL (ref <150)

## 2021-06-03 LAB — HEMOGLOBIN A1C
Hgb A1c MFr Bld: 5.8 % of total Hgb — ABNORMAL HIGH (ref <5.7)
Mean Plasma Glucose: 120 mg/dL
eAG (mmol/L): 6.6 mmol/L

## 2021-06-03 LAB — VITAMIN B12: Vitamin B-12: 702 pg/mL (ref 200–1100)

## 2021-06-03 LAB — VITAMIN D 25 HYDROXY (VIT D DEFICIENCY, FRACTURES): Vit D, 25-Hydroxy: 59 ng/mL (ref 30–100)

## 2021-06-05 ENCOUNTER — Encounter: Payer: Medicare PPO | Admitting: Family Medicine

## 2021-06-06 ENCOUNTER — Encounter: Payer: Self-pay | Admitting: Family Medicine

## 2021-06-06 ENCOUNTER — Other Ambulatory Visit: Payer: Self-pay

## 2021-06-06 ENCOUNTER — Ambulatory Visit (INDEPENDENT_AMBULATORY_CARE_PROVIDER_SITE_OTHER): Payer: Medicare PPO | Admitting: Family Medicine

## 2021-06-06 VITALS — BP 129/69 | HR 69 | Ht 75.0 in | Wt 172.4 lb

## 2021-06-06 DIAGNOSIS — M1A9XX0 Chronic gout, unspecified, without tophus (tophi): Secondary | ICD-10-CM | POA: Diagnosis not present

## 2021-06-06 DIAGNOSIS — Z0000 Encounter for general adult medical examination without abnormal findings: Secondary | ICD-10-CM | POA: Diagnosis not present

## 2021-06-06 DIAGNOSIS — I10 Essential (primary) hypertension: Secondary | ICD-10-CM | POA: Diagnosis not present

## 2021-06-06 DIAGNOSIS — J3089 Other allergic rhinitis: Secondary | ICD-10-CM | POA: Diagnosis not present

## 2021-06-06 DIAGNOSIS — R7309 Other abnormal glucose: Secondary | ICD-10-CM

## 2021-06-06 DIAGNOSIS — E039 Hypothyroidism, unspecified: Secondary | ICD-10-CM | POA: Diagnosis not present

## 2021-06-06 DIAGNOSIS — E782 Mixed hyperlipidemia: Secondary | ICD-10-CM | POA: Diagnosis not present

## 2021-06-06 DIAGNOSIS — I480 Paroxysmal atrial fibrillation: Secondary | ICD-10-CM

## 2021-06-06 MED ORDER — MONTELUKAST SODIUM 10 MG PO TABS
10.0000 mg | ORAL_TABLET | Freq: Every day | ORAL | 3 refills | Status: AC
Start: 2021-06-06 — End: ?

## 2021-06-06 MED ORDER — ALLOPURINOL 300 MG PO TABS: 300.0000 mg | ORAL_TABLET | Freq: Every day | ORAL | 3 refills | Status: DC

## 2021-06-06 NOTE — Progress Notes (Signed)
Subjective:    Patient ID: Arthur Romero, male    DOB: October 25, 1941, 80 y.o.   MRN: [id number]  Arthur Romero is a 80 y.o. male presenting on 06/06/2021 for Annual Exam  HPI  Here for Annual Physical and Lab Review.   Paroxysmal Atrial Fibrillation, anticoagulation on Xarelto History of GIB / hemorrhoids - hospitalized, was on both Xarelto and Aspirin, now off Aspirin and doing much better. He saw [facility]iology 07/17/20 and they discussed this and ultimately he decided against ablation and other aggressive treatment, and cardiology recommended to continue anticoagulation with Xarelto. He has opted to continue this and remain OFF aspirin, he has done better without bleeding significantly. - He continues home PT exercises, improving strength gradually, still fatigued - Admits sensation of feeling cold on xarelto still - He remains OFF Aspirin  HYPERLIPIDEMIA: - Reports no concerns. Last lipid panel 05/2021, controlled  - Currently taking Atorvastatin 40mg , tolerating well without side effects or myalgias   Pre-Diabetes Prior A1c 6.0-6.1, now result is 5.8 A1c improved Meds: None He is limiting excess Denies hypoglycemia, polyuria, visual changes, numbness or tingling.   CHRONIC HTN: Reports no new concerns. Current Meds - controlled on Diltiazem 180mg  daily - off Amlodipine   Reports good compliance, took meds today. Tolerating well, w/o complaints. Denies CP, dyspnea, HA, edema, dizziness / lightheadedness  Vitamin B12 On supplement, normal lab.  Vitamin D Improved, on Vitamin D 59 lab level. On supplement.    Health Maintenance:  PSA 0.45, negative.  Depression screen [facility] 2/9 05/13/2021 03/10/2021 02/19/2021  Decreased Interest 0 1 0  Down, Depressed, Hopeless 0 0 0  PHQ - 2 Score 0 1 0  Altered sleeping - 0 0  Tired, decreased energy - 0 0  Change in appetite - 0 0  Feeling bad or failure about yourself  - 0 0  Trouble concentrating - 0 0  Moving slowly or  fidgety/restless - 0 0  Suicidal thoughts - 0 0  PHQ-9 Score - 1 0  Difficult doing work/chores - - Not difficult at all    Past Medical History:  Diagnosis Date   Arthritis    GERD (gastroesophageal reflux disease)    Hypertension    Rheumatoid arthritis ([place])    Thyroid disease    Past Surgical History:  Procedure Laterality Date   CARDIAC SURGERY     COLONOSCOPY N/A 07/09/2020   Procedure: COLONOSCOPY;  Surgeon: Jonathon Bellows, MD;  Location: [facility] ENDOSCOPY;  Service: Gastroenterology;  Laterality: N/A;   ESOPHAGOGASTRODUODENOSCOPY N/A 07/09/2020   Procedure: ESOPHAGOGASTRODUODENOSCOPY (EGD);  Surgeon: Jonathon Bellows, MD;  Location: [facility] ENDOSCOPY;  Service: Gastroenterology;  Laterality: N/A;   SHOULDER SURGERY Right 2016   TYMPANOPLASTY Right 1987   vocal cords  1989   polyps   Social History   Socioeconomic History   Marital status: Single    Spouse name: Not on file   Number of children: Not on file   Years of education: high school   Highest education level: High school graduate  Occupational History   Not on file  Tobacco Use   Smoking status: Never   Smokeless tobacco: Never  Vaping Use   Vaping Use: Never used  Substance and Sexual Activity   Alcohol use: Never    Alcohol/week: 1.0 standard drink    Types: 1 Glasses of wine per week   Drug use: Never   Sexual activity: Not on file  Other Topics Concern   Not on file  Social History Narrative   Not on file   Social Determinants of Health   Financial Resource Strain: Low Risk    Difficulty of Paying Living Expenses: Not hard at all  Food Insecurity: No Food Insecurity   Worried About Charity fundraiser in the Last Year: Never true   Arboriculturist in the Last Year: Never true  Transportation Needs: No Transportation Needs   Lack of Transportation (Medical): No   Lack of Transportation (Non-Medical): No  Physical Activity: Insufficiently Active   Days of Exercise per Week: 7 days   Minutes of Exercise  per Session: 20 min  Stress: No Stress Concern Present   Feeling of Stress : Not at all  Social Connections: Not on file  Intimate Partner Violence: Not on file   Family History  Problem Relation Age of Onset   Hypertension Mother    Osteoarthritis Mother    Parkinson's disease Mother    Heart attack Father 48   Tuberculosis Father    Stroke Brother    Diabetes Brother    Current Outpatient Medications on File Prior to Visit  Medication Sig   acetaminophen (TYLENOL) 500 MG tablet Take 500-1,000 mg by mouth every 6 (six) hours as needed for mild pain or fever.    atorvastatin (LIPITOR) 40 MG tablet TAKE 1 TABLET EVERY DAY   Cholecalciferol (VITAMIN D3 PO) Take by mouth daily.   diltiazem (CARDIZEM CD) 180 MG 24 hr capsule Take 1 capsule (180 mg total) by mouth daily.   fluticasone (FLONASE) 50 MCG/ACT nasal spray Place 2 sprays into both nostrils daily. Use for 4-6 weeks then stop and use seasonally or as needed.   gabapentin (NEURONTIN) 100 MG capsule TAKE 1 CAPSULE IN THE MORNING, 1 CAPSULE IN THE AFTERNOON, AND 2 CAPSULES AT BEDTIME.   levothyroxine (SYNTHROID) 88 MCG tablet TAKE 1 TABLET (88 MCG TOTAL) BY MOUTH DAILY.   melatonin 3 MG TABS tablet Take 3 mg by mouth at bedtime as needed (sleep).   omeprazole (PRILOSEC) 40 MG capsule Take 1 capsule (40 mg total) by mouth daily.   polyethylene glycol (MIRALAX / GLYCOLAX) 17 g packet Take 17 g by mouth in the morning and at bedtime.   Rivaroxaban (XARELTO) 15 MG TABS tablet Take 1 tablet (15 mg total) by mouth daily with supper.   No current facility-administered medications on file prior to visit.    Review of Systems  Constitutional:  Negative for activity change, appetite change, chills, diaphoresis, fatigue and fever.  HENT:  Negative for congestion and hearing loss.   Eyes:  Negative for visual disturbance.  Respiratory:  Negative for cough, chest tightness, shortness of breath and wheezing.   Cardiovascular:  Negative for  chest pain, palpitations and leg swelling.  Gastrointestinal:  Positive for constipation. Negative for abdominal pain, diarrhea, nausea and vomiting.  Genitourinary:  Negative for dysuria, frequency and hematuria.  Musculoskeletal:  Negative for arthralgias and neck pain.  Skin:  Negative for rash.  Neurological:  Negative for dizziness, weakness, light-headedness, numbness and headaches.  Hematological:  Negative for adenopathy.  Psychiatric/Behavioral:  Negative for behavioral problems, dysphoric mood and sleep disturbance.   Per HPI unless specifically indicated above      Objective:    BP 129/69   Pulse 69   Ht $R'6\' 3"'xK$  (1.905 m)   Wt 172 lb 6.4 oz (78.2 kg)   SpO2 98%   BMI 21.55 kg/m   Wt Readings from Last 3 Encounters:  06/06/21  172 lb 6.4 oz (78.2 kg)  05/20/21 173 lb 3.2 oz (78.6 kg)  05/13/21 188 lb (85.3 kg)    Physical Exam Vitals and nursing note reviewed.  Constitutional:      General: He is not in acute distress.    Appearance: He is well-developed. He is not diaphoretic.     Comments: Well-appearing, comfortable, cooperative  HENT:     Head: Normocephalic and atraumatic.  Eyes:     General:        Right eye: No discharge.        Left eye: No discharge.     Conjunctiva/sclera: Conjunctivae normal.     Pupils: Pupils are equal, round, and reactive to light.  Neck:     Thyroid: No thyromegaly.  Cardiovascular:     Rate and Rhythm: Normal rate and regular rhythm.     Pulses: Normal pulses.     Heart sounds: Normal heart sounds. No murmur heard. Pulmonary:     Effort: Pulmonary effort is normal. No respiratory distress.     Breath sounds: Normal breath sounds. No wheezing or rales.  Abdominal:     General: Bowel sounds are normal. There is no distension.     Palpations: Abdomen is soft. There is no mass.     Tenderness: There is no abdominal tenderness.  Musculoskeletal:        General: No tenderness. Normal range of motion.     Cervical back: Normal  range of motion and neck supple.     Comments: Upper / Lower Extremities: - Normal muscle tone, strength bilateral upper extremities 5/5, lower extremities 5/5  Lymphadenopathy:     Cervical: No cervical adenopathy.  Skin:    General: Skin is warm and dry.     Findings: No erythema or rash.  Neurological:     Mental Status: He is alert and oriented to person, place, and time.     Comments: Distal sensation intact to light touch all extremities  Psychiatric:        Mood and Affect: Mood normal.        Behavior: Behavior normal.        Thought Content: Thought content normal.     Comments: Well groomed, good eye contact, normal speech and thoughts      Results for orders placed or performed in visit on 06/02/21  Vitamin B12  Result Value Ref Range   Vitamin B-12 702 200 - 1,100 pg/mL  VITAMIN D 25 Hydroxy (Vit-D Deficiency, Fractures)  Result Value Ref Range   Vit D, 25-Hydroxy 59 30 - 100 ng/mL  TSH  Result Value Ref Range   TSH 1.71 0.40 - 4.50 mIU/L  PSA  Result Value Ref Range   PSA 0.45 < OR = 4.00 ng/mL  Lipid panel  Result Value Ref Range   Cholesterol 131 <200 mg/dL   HDL 62 > OR = 40 mg/dL   Triglycerides 88 <150 mg/dL   LDL Cholesterol (Calc) 52 mg/dL (calc)   Total CHOL/HDL Ratio 2.1 <5.0 (calc)   Non-HDL Cholesterol (Calc) 69 <130 mg/dL (calc)  COMPLETE METABOLIC PANEL WITH GFR  Result Value Ref Range   Glucose, Bld 86 65 - 99 mg/dL   BUN 15 7 - 25 mg/dL   Creat 0.97 0.70 - 1.28 mg/dL   eGFR 80 > OR = 60 mL/min/1.47m2   BUN/Creatinine Ratio NOT APPLICABLE 6 - 22 (calc)   Sodium 141 135 - 146 mmol/L   Potassium 4.6 3.5 - 5.3 mmol/L  Chloride 102 98 - 110 mmol/L   CO2 29 20 - 32 mmol/L   Calcium 9.8 8.6 - 10.3 mg/dL   Total Protein 6.9 6.1 - 8.1 g/dL   Albumin 4.4 3.6 - 5.1 g/dL   Globulin 2.5 1.9 - 3.7 g/dL (calc)   AG Ratio 1.8 1.0 - 2.5 (calc)   Total Bilirubin 0.6 0.2 - 1.2 mg/dL   Alkaline phosphatase (APISO) 80 35 - 144 U/L   AST 17 10 - 35  U/L   ALT 15 9 - 46 U/L  CBC with Differential/Platelet  Result Value Ref Range   WBC 9.5 3.8 - 10.8 Thousand/uL   RBC 4.61 4.20 - 5.80 Million/uL   Hemoglobin 14.6 13.2 - 17.1 g/dL   HCT 44.2 38.5 - 50.0 %   MCV 95.9 80.0 - 100.0 fL   MCH 31.7 27.0 - 33.0 pg   MCHC 33.0 32.0 - 36.0 g/dL   RDW 12.9 11.0 - 15.0 %   Platelets 274 140 - 400 Thousand/uL   MPV 9.4 7.5 - 12.5 fL   Neutro Abs 6,299 1,500 - 7,800 cells/uL   Lymphs Abs 2,147 850 - 3,900 cells/uL   Absolute Monocytes 789 200 - 950 cells/uL   Eosinophils Absolute 219 15 - 500 cells/uL   Basophils Absolute 48 0 - 200 cells/uL   Neutrophils Relative % 66.3 %   Total Lymphocyte 22.6 %   Monocytes Relative 8.3 %   Eosinophils Relative 2.3 %   Basophils Relative 0.5 %  Hemoglobin A1c  Result Value Ref Range   Hgb A1c MFr Bld 5.8 (H) <5.7 % of total Hgb   Mean Plasma Glucose 120 mg/dL   eAG (mmol/L) 6.6 mmol/L      Assessment & Plan:   Problem List Items Addressed This Visit     Paroxysmal atrial fibrillation (HCC)   Mixed hyperlipidemia   Essential hypertension   Elevated hemoglobin A1c   Acquired hypothyroidism   Other Visit Diagnoses     Annual physical exam    -  Primary   Seasonal allergic rhinitis due to other allergic trigger       Relevant Medications   montelukast (SINGULAIR) 10 MG tablet   Chronic gout without tophus, unspecified cause, unspecified site       Relevant Medications   allopurinol (ZYLOPRIM) 300 MG tablet       Updated Health Maintenance information Reviewed recent lab results with patient Encouraged improvement to lifestyle with diet and exercise Goal of weight loss  Handicap placard form today  Continue on Allopurinol for gout  Allergies - refill singulair  AFib, currently in sinus rhythm  A1c improved, stable to 5.8  HTN HLD Continue current medications     Meds ordered this encounter  Medications   allopurinol (ZYLOPRIM) 300 MG tablet    Sig: Take 1 tablet (300 mg  total) by mouth daily.    Dispense:  90 tablet    Refill:  3    Keep refills on file for when ready   montelukast (SINGULAIR) 10 MG tablet    Sig: Take 1 tablet (10 mg total) by mouth at bedtime.    Dispense:  90 tablet    Refill:  3    Keep refills on file for when ready     Follow up plan: Return in about 6 months (around 12/07/2021) for 6 month Follow-up.  [name], [facility] 06/06/2021, 10:52 AM

## 2021-06-06 NOTE — Patient Instructions (Addendum)
Thank you for coming to the office today.  Keep up the great work  Refilled [employer] Allopurinol and Montelukast  For Constipation (less frequent bowel movement that can be hard dry or involve straining).  Recommend trying OTC Miralax 17g = 1 capful in large glass water once daily for now, try several days to see if working, goal is soft stool or BM 1-2 times daily, if too loose then reduce dose or try every other day. If not effective may need to increase it to 2 doses at once in AM or may do 1 in morning and 1 in afternoon/evening  - This medicine is very safe and can be used often without any problem and will not make you dehydrated. It is good for use on AS NEEDED BASIS or even MAINTENANCE therapy for longer term for several days to weeks at a time to help regulate bowel movements  Other more natural remedies or preventative treatment: - Increase hydration with water - Increase fiber in diet (high fiber foods = vegetables, leafy greens, oats/grains) - May take OTC Fiber supplement (metamucil powder or pill/gummy) - May try OTC Probiotic   Please schedule a Follow-up Appointment to: Return in about 6 months (around 12/07/2021) for 6 month Follow-up.  If you have any other questions or concerns, please feel free to call the office or send a message through [place]. You may also schedule an earlier appointment if necessary.  Additionally, you may be receiving a survey about your experience at our office within a few days to 1 week by e-mail or mail. We value your feedback.  [name], DO [place]

## 2021-06-17 ENCOUNTER — Other Ambulatory Visit: Payer: Self-pay

## 2021-06-17 NOTE — Patient Outreach (Signed)
[facility]) Care Management  06/17/2021  KERSHAW SPHAR May 07, 1941 [id number]   Telephone Assessment   Successful outreach call placed to patient. He reports he has seen several MDs lately. He saw PCP earlier this month. Patient saw ENT and was told he needs one hearing aide. He is considering getting it. He reports his sister is working on getting him possibly approved for Medicaid to assist with cost.   Medications Reviewed Today     Reviewed by Hayden Pedro, RN (Registered Nurse) on 06/17/21 at [place] List Status: <None>   Medication Order Taking? Sig Documenting Provider Last Dose Status Informant  acetaminophen (TYLENOL) 500 MG tablet [id number] No Take 500-1,000 mg by mouth every 6 (six) hours as needed for mild pain or fever.  [provider] Taking Active Other  allopurinol (ZYLOPRIM) 300 MG tablet [id number]  Take 1 tablet (300 mg total) by mouth daily. Olin Hauser, DO  Active   atorvastatin (LIPITOR) 40 MG tablet [id number] No TAKE 1 TABLET EVERY DAY Olin Hauser, DO Taking Active   Cholecalciferol (VITAMIN D3 PO) [id number] No Take by mouth daily. [provider] Taking Active   diltiazem (CARDIZEM CD) 180 MG 24 hr capsule [id number] No Take 1 capsule (180 mg total) by mouth daily. Olin Hauser, DO Taking Active   fluticasone (FLONASE) 50 MCG/ACT nasal spray [id number] No Place 2 sprays into both nostrils daily. Use for 4-6 weeks then stop and use seasonally or as needed. Olin Hauser, DO Taking Active   gabapentin (NEURONTIN) 100 MG capsule [id number] No TAKE 1 CAPSULE IN THE MORNING, 1 CAPSULE IN THE AFTERNOON, AND 2 CAPSULES AT BEDTIME. Olin Hauser, DO Taking Active   levothyroxine (SYNTHROID) 88 MCG tablet [id number] No TAKE 1 TABLET (88 MCG TOTAL) BY MOUTH DAILY. Olin Hauser, DO Taking Active   melatonin 3 MG TABS tablet [id number] No Take 3 mg by mouth  at bedtime as needed (sleep). [provider] Taking Active Self  montelukast (SINGULAIR) 10 MG tablet [id number]  Take 1 tablet (10 mg total) by mouth at bedtime. Karamalegos, Devonne Doughty, DO  Active   omeprazole (PRILOSEC) 40 MG capsule [id number] No Take 1 capsule (40 mg total) by mouth daily. Olin Hauser, DO Taking Active   polyethylene glycol (MIRALAX / GLYCOLAX) 17 g packet [id number] No Take 17 g by mouth in the morning and at bedtime. [provider] Taking Active   Rivaroxaban (XARELTO) 15 MG TABS tablet [id number] No Take 1 tablet (15 mg total) by mouth daily with supper. Olin Hauser, DO Taking Active              Goals Addressed               This Visit's Progress     (THN)Prevent Falls and Injury (pt-stated)        Timeframe:  Long-Range Goal Priority:  High Start Date:   02/17/21                          Expected End Date:  08/24/2021                    Follow Up Date: Oct 2022  Barriers: Health Behaviors Knowledge    - always use handrails on the stairs - keep my cell phone with me always - learn how to get back up if I fall - make an emergency alert  plan in case I fall - use a cane or walker - wear my glasses and/or hearing aid    Why is this important?   Most falls happen when it is hard for you to walk safely. Your balance may be off because of an illness. You may have pain in your knees, hip or other joints.  You may be overly tired or taking medicines that make you sleepy. You may not be able to see or hear clearly.  Falls can lead to broken bones, bruises or other injuries.  There are things you can do to help prevent falling.     Notes:   02/17/21-Patient reports two falls over the past weekend attributes it to "ringing in ears" that he is having. He is going today for neuro follow up appt and has PCP appt later this week. He has cane and voices using it all the time with ambulation.   02/27/21-Patient completed  follow up appt with both PCP and neuro regarding recent fall and ear issues. His Gabapentin was adjusted and he was started on abx therapy. Patient reports he can not tell a big difference in sxs improvement yet. He is aware to follow back up with MD if sxs worsen and/or unresolved. He states that he will probably go ahead and make ENT appt as well. He has had no further falls. He is using DME to assist with ambulation. Patient has life alert device in the home as well.  06/16/21-Patient reports he went to see spinal MD as he was told her had "crack in vertebrae." He goes back to see MD on 07/02/21 to discuss tx options. 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 He is using DME to assist with ambulation. Denies any recent falls since last talking with RN CM.           Plan: RN CM discussed with patient next outreach within the month of Oct. Patient agrees to care plan and follow up. Patient gave verbal consent and in agreement with RN CM follow up and timeframe. Patient aware that they may contact RN CM sooner for any issues or concerns. RN CM reviewed goals and plan of care with patient. RN CM will send quarterly update to PCP.  Enzo Montgomery, RN,BSN,CCM [facility] Telephonic Care Management Coordinator Direct Phone: [phone] Toll Free: [phone] Fax: [phone]

## 2021-06-20 ENCOUNTER — Telehealth: Payer: Self-pay | Admitting: Family Medicine

## 2021-06-20 NOTE — Telephone Encounter (Signed)
Pt called stating that he received a call from Pharmacy. He states that they told him they cannot refill his montelukast without more info from PCP. He states that they did not mention to him what type of info they need. Please advise.      [facility] ([facility] - [place]  [place] [address]  Phone: [phone] Fax: [phone]  Hours: Not open 24 hours

## 2021-06-27 ENCOUNTER — Other Ambulatory Visit: Payer: Self-pay | Admitting: Family Medicine

## 2021-06-27 DIAGNOSIS — M1A9XX0 Chronic gout, unspecified, without tophus (tophi): Secondary | ICD-10-CM

## 2021-06-27 NOTE — Telephone Encounter (Signed)
Requested medications are due for refill today yes (mail order)  Requested medications are on the active medication list yes  Last refill 7/2 (mail order)  Last visit 06/06/21  Future visit scheduled 12/08/2021  Notes to clinic Failed protocol due to no urice acid within 360 days, please assess.

## 2021-07-07 ENCOUNTER — Other Ambulatory Visit: Payer: Self-pay

## 2021-07-07 ENCOUNTER — Encounter: Payer: Self-pay | Admitting: Student in an Organized Health Care Education/Training Program

## 2021-07-07 ENCOUNTER — Ambulatory Visit: Payer: Medicare PPO | Admitting: Student in an Organized Health Care Education/Training Program

## 2021-07-07 ENCOUNTER — Ambulatory Visit
Admission: RE | Admit: 2021-07-07 | Discharge: 2021-07-07 | Disposition: A | Discharge status: Home / Self Care | Payer: Medicare PPO | Source: Ambulatory Visit | Attending: Student in an Organized Health Care Education/Training Program | Admitting: Student in an Organized Health Care Education/Training Program

## 2021-07-07 VITALS — BP 121/80 | HR 73 | Temp 97.1°F | Resp 16 | Ht 75.0 in | Wt 172.0 lb

## 2021-07-07 DIAGNOSIS — G894 Chronic pain syndrome: Secondary | ICD-10-CM | POA: Insufficient documentation

## 2021-07-07 DIAGNOSIS — M47816 Spondylosis without myelopathy or radiculopathy, lumbar region: Secondary | ICD-10-CM | POA: Insufficient documentation

## 2021-07-07 DIAGNOSIS — G608 Other hereditary and idiopathic neuropathies: Secondary | ICD-10-CM | POA: Diagnosis not present

## 2021-07-07 DIAGNOSIS — M5416 Radiculopathy, lumbar region: Secondary | ICD-10-CM | POA: Insufficient documentation

## 2021-07-07 DIAGNOSIS — M48062 Spinal stenosis, lumbar region with neurogenic claudication: Secondary | ICD-10-CM | POA: Insufficient documentation

## 2021-07-07 NOTE — Progress Notes (Signed)
Patient: Arthur Romero  Service Category: E/M  Provider: Gillis Santa, MD  DOB: [birth year]  DOS: 07/07/2021  Referring Provider: Meade Maw, MD  MRN: [id number]  Setting: Ambulatory outpatient  PCP: Olin Hauser, DO  Type: New Patient  Specialty: Interventional Pain Management    Location: Office  Delivery: Face-to-face     Primary Reason(s) for Visit: Encounter for initial evaluation of one or more chronic problems (new to examiner) potentially causing chronic pain, and posing a threat to normal musculoskeletal function. (Level of risk: High) CC: Back Pain (Mid - lower left is worse ), Shoulder Pain (Bilateral ), Neck Pain (Right is worse ), Hand Pain (Left ), and Foot Pain (Left )  HPI  Arthur Romero is a 80 y.o. year old, male patient, who comes for the first time to our practice referred by Meade Maw, MD for our initial evaluation of his chronic pain. He has Acquired hypothyroidism; Essential hypertension; Acquired bilateral pes cavus; Mixed hyperlipidemia; Elevated hemoglobin A1c; Mononeuropathy; Aortic atherosclerosis ([place]); Foot pain, bilateral; Abdominal pain; Hyponatremia; Generalized weakness; Malnutrition of moderate degree; Paroxysmal atrial fibrillation ([place]); Lower GI bleed; Proctitis; GI bleeding; DDD (degenerative disc disease), lumbar; Lumbar foraminal stenosis; Lumbar spondylosis; Spinal stenosis, lumbar region, with neurogenic claudication; Lumbar radiculopathy; Chronic pain syndrome; and Sensory polyneuropathy on their problem list. Today he comes in for evaluation of his Back Pain (Mid - lower left is worse ), Shoulder Pain (Bilateral ), Neck Pain (Right is worse ), Hand Pain (Left ), and Foot Pain (Left )  Pain Assessment: Location: Mid, Upper, Right, Left Back (see visit info for all pain sites.) Radiating: back pain into left leg and neck pain into left arm. Onset: More than a month ago Duration: Chronic pain Quality: Discomfort, Constant,  Numbness, Pins and needles, Tingling (pinching.) Severity: 7 /10 (subjective, self-reported pain score)  Effect on ADL: able to some household chores. Timing: Constant Modifying factors: tylenol.  exercises instructed by PT. BP: 121/80  HR: 73  Onset and Duration: Sudden and Present longer than 3 months Cause of pain: Unknown Severity: No change since onset, NAS-11 at its worse: 8/10, and NAS-11 now: 7/10 Timing: Morning, Afternoon, Night, After activity or exercise, and After a period of immobility Aggravating Factors: Bending, Bowel movements, Lifiting, Prolonged standing, Stooping , and Twisting Alleviating Factors: Lying down and Medications Associated Problems: Constipation, Depression, Fatigue, Numbness, Sadness, Temperature changes, Tingling, Pain that wakes patient up, and Pain that does not allow patient to sleep Quality of Pain: Agonizing, Annoying, Burning, Getting longer, Nagging, Sharp, Shooting, Tender, Tingling, and Uncomfortable Previous Examinations or Tests: MRI scan and X-rays Previous Treatments: Epidural steroid injections  Arthur Romero is a pleasant 80 year old male who presents with a chief complaint of primarily low back pain with occasional radiation into posterior lateral thighs and posterior calves left greater than right.  He also has associated numbness and tingling in bilateral feet.  His pain is worse with walking and standing.  It is very severe.  He has done home health physical therapy in the fall 2021 and in 2018 and steroids.  He has tried Lyrica, Tylenol, gabapentin, tramadol in the past.  He has had bilateral S1 transforaminal ESI's done on 5/10 and 04/03/2021.  These provided limited analgesic benefit.  He also has sensory polyneuropathy diagnosed via nerve conduction velocity/EMG.  He is being referred today to discuss spinal cord stimulation by Dr. Cari Caraway.  Meds   Current Outpatient Medications:    acetaminophen (TYLENOL) 500 MG tablet, Take 500-1,000 mg  by mouth every 6 (six) hours as needed for mild pain or fever. , Disp: , Rfl:    allopurinol (ZYLOPRIM) 300 MG tablet, TAKE 1 TABLET EVERY DAY, Disp: 90 tablet, Rfl: 3   atorvastatin (LIPITOR) 40 MG tablet, TAKE 1 TABLET EVERY DAY, Disp: 90 tablet, Rfl: 1   Cholecalciferol (VITAMIN D3 PO), Take by mouth daily., Disp: , Rfl:    diltiazem (CARDIZEM CD) 180 MG 24 hr capsule, Take 1 capsule (180 mg total) by mouth daily., Disp: 90 capsule, Rfl: 3   fluticasone (FLONASE) 50 MCG/ACT nasal spray, Place 2 sprays into both nostrils daily. Use for 4-6 weeks then stop and use seasonally or as needed., Disp: 16 g, Rfl: 3   gabapentin (NEURONTIN) 100 MG capsule, TAKE 1 CAPSULE IN THE MORNING, 1 CAPSULE IN THE AFTERNOON, AND 2 CAPSULES AT BEDTIME., Disp: 360 capsule, Rfl: 1   levothyroxine (SYNTHROID) 88 MCG tablet, TAKE 1 TABLET (88 MCG TOTAL) BY MOUTH DAILY., Disp: 90 tablet, Rfl: 3   melatonin 3 MG TABS tablet, Take 3 mg by mouth at bedtime as needed (sleep)., Disp: , Rfl:    montelukast (SINGULAIR) 10 MG tablet, Take 1 tablet (10 mg total) by mouth at bedtime., Disp: 90 tablet, Rfl: 3   Multiple Vitamins-Minerals (CENTRUM SILVER 50+MEN PO), Take 1 tablet by mouth daily., Disp: , Rfl:    omeprazole (PRILOSEC) 40 MG capsule, Take 1 capsule (40 mg total) by mouth daily., Disp: 90 capsule, Rfl: 3   polyethylene glycol (MIRALAX / GLYCOLAX) 17 g packet, Take 17 g by mouth in the morning and at bedtime., Disp: , Rfl:    Rivaroxaban (XARELTO) 15 MG TABS tablet, Take 1 tablet (15 mg total) by mouth daily with supper., Disp: 30 tablet, Rfl: 2   traMADol (ULTRAM) 50 MG tablet, Take 25-50 mg by mouth 2 (two) times daily as needed., Disp: , Rfl:   Imaging Review  Narrative CLINICAL DATA:  Pt fell walking [place] trail this am and slipped on acorns injuring rt shoulder/ humerus. Most pain whole arm but more mid post humerus  EXAM: RIGHT SHOULDER - 2+ VIEW  COMPARISON:  None  FINDINGS: Right shoulder is  dislocated. Humeral head is dislocated anterior and slightly inferior in relation to the glenoid. No fracture is seen.  AC joint is normally aligned. 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  Soft tissues are unremarkable.  IMPRESSION: 1. Anterior inferior dislocation right humeral head. 2. No fracture.   Electronically Signed By: Lajean Manes M.D. On: 08/19/2015 11:10  Thoracic Imaging: Thoracic MR wo contrast: Results for orders placed during the hospital encounter of 05/12/21  MR THORACIC SPINE WO CONTRAST  Narrative CLINICAL DATA:  Prior fall with midthoracic back pain.  EXAM: MRI THORACIC SPINE WITHOUT CONTRAST  TECHNIQUE: Multiplanar, multisequence MR imaging of the thoracic spine was performed. No intravenous contrast was administered.  COMPARISON:  None.  FINDINGS: Alignment: Exaggerated thoracic kyphosis. No substantial sagittal subluxation.  Vertebrae: Approximately 30% height loss of the T7 vertebral body anteriorly. No associated marrow edema. Focal marrow edema about the T12-L1 disc space anteriorly, compatible with degenerative/discogenic endplate change and possible active Schmorl's nodes. Moderate to severe degenerative disc height loss and more chronic appearing endplate signal changes throughout the mid to lower thoracic spine.  Cord:  Normal cord signal.  Paraspinal and other soft tissues: Prior Nissen fundoplication partially herniated into the chest, better characterized on prior CT abdomen/pelvis from 07/06/2020. no paraspinal edema.  Disc levels:  Degenerative disc/endplate findings  are described above. At T7-T8 a small to moderate central disc herniation contacts and flattens the ventral cord with mild canal stenosis. Small right eccentric disc protrusion at T6-T7 also flattens the cord without significant canal stenosis. Additional multilevel small disc herniations without significant canal stenosis. Perineural root sleeve  cysts at multiple levels, largest on the right at T6-T7. Multilevel facet hypertrophy with mild to moderate bilateral foraminal stenosis at T10-T11. Additional multilevel mild foraminal stenosis.  IMPRESSION: 1. Approximately 30% height loss of the T7 vertebral body anteriorly, likely the sequela of prior compression fracture and/or degenerative remodeling. No associated marrow edema to suggest recent/unhealed fracture. 2. Focal marrow edema about the T12-L1 disc space anteriorly, compatible with degenerative/discogenic endplate change and possible inflamed Schmorl's nodes. 3. Moderate to severe degenerative disc height loss and more chronic appearing endplate signal changes throughout the mid to lower thoracic spine. 4. At T7-T8 a small to moderate central disc herniation contacts and flattens the ventral cord with mild canal stenosis. Small right eccentric disc protrusion at T6-T7 also flattens the ventral cord without significant canal stenosis. 5. Mild-to-moderate bilateral foraminal stenosis at T10-T11 with multilevel mild foraminal stenosis.   Electronically Signed By: Margaretha Sheffield MD On: 05/13/2021 13:09 Lumbosacral Imaging: Lumbar MR wo contrast: Results for orders placed during the hospital encounter of 10/09/20  MR LUMBAR SPINE WO CONTRAST  Narrative CLINICAL DATA:  Lower back and left greater than right leg pain.  EXAM: MRI LUMBAR SPINE WITHOUT CONTRAST  TECHNIQUE: Multiplanar, multisequence MR imaging of the lumbar spine was performed. No intravenous contrast was administered.  COMPARISON:  07/06/2020 and prior.  FINDINGS: Segmentation:  Standard.  Alignment:  Minimal grade 1 L5-S1 retrolisthesis.  Vertebrae: Multilevel Modic type 2 and 1 endplate degenerative changes. Vertebral body heights are preserved. No aggressive osseous lesion.  Conus medullaris and cauda equina: Conus extends to the L2 level. Conus and cauda equina appear normal.  Disc  levels: Multilevel desiccation and disc space loss.  T12-L1: Mild disc bulge.  Patent spinal canal and neural foramen.  L1-2: Minimal disc bulge.  Patent spinal canal and neural foramen.  L2-3: Disc bulge and bilateral facet degenerative spurring. Minimal spinal canal and mild bilateral neural foraminal narrowing.  L3-4: Disc bulge and bilateral facet degenerative spurring. Mild spinal canal and bilateral neural foraminal narrowing.  L4-5: Disc bulge grazing the descending L5 nerve roots. Prominent ligamentum flavum and facet degenerative spurring. Mild spinal canal and bilateral neural foraminal narrowing.  L5-S1: Disc bulge with shallow central protrusion/annular fissuring. Grazing of the bilateral exiting L5 and descending left S1 nerve roots. Patent spinal canal. Mild bilateral neural foraminal narrowing.  Paraspinal and other soft tissues: Negative.  IMPRESSION: Multilevel spondylosis. Grazing of the descending/exiting L5 and descending left S1 nerve roots.  Mild spinal canal and bilateral neural foraminal narrowing at the L2-3 levels.  Mild bilateral L5-S1 neural foraminal narrowing.   Electronically Signed By: [name] M.D. On: 10/09/2020 15:14   Complexity Note: Imaging results reviewed. Results shared with Arthur Romero, using Layman's terms.                         ROS  Cardiovascular: Heart trouble and High blood pressure Pulmonary or Respiratory: No reported pulmonary signs or symptoms such as wheezing and difficulty taking a deep full breath (Asthma), difficulty blowing air out (Emphysema), coughing up mucus (Bronchitis), persistent dry cough, or temporary stoppage of breathing during sleep Neurological: Abnormal skin sensations (Peripheral Neuropathy) Psychological-Psychiatric: Depressed Gastrointestinal:  Reflux or heatburn Genitourinary: No reported renal or genitourinary signs or symptoms such as difficulty voiding or producing urine, peeing  blood, non-functioning kidney, kidney stones, difficulty emptying the bladder, difficulty controlling the flow of urine, or chronic kidney disease Hematological: Brusing easily Endocrine: Slow thyroid Rheumatologic: Joint aches and or swelling due to excess weight (Osteoarthritis) and Rheumatoid arthritis Musculoskeletal: Negative for myasthenia gravis, muscular dystrophy, multiple sclerosis or malignant hyperthermia Work History: Retired  Allergies  Arthur Romero is allergic to ace inhibitors and prednisone.  Laboratory Chemistry Profile   Renal Lab Results  Component Value Date   BUN 15 06/02/2021   CREATININE 0.97 06/02/2021   LABCREA 170 75/64/3329   BCR NOT APPLICABLE 51/88/4166   GFRAA 97 07/15/2020   GFRNONAA 84 07/15/2020   PROTEINUR NEGATIVE 05/18/2020     Electrolytes Lab Results  Component Value Date   NA 141 06/02/2021   K 4.6 06/02/2021   CL 102 06/02/2021   CALCIUM 9.8 06/02/2021   MG 1.8 07/10/2020   PHOS 4.3 07/10/2020     Hepatic Lab Results  Component Value Date   AST 17 06/02/2021   ALT 15 06/02/2021   ALBUMIN 3.6 07/10/2020   ALKPHOS 80 07/10/2020   LIPASE 29 05/18/2020     ID Lab Results  Component Value Date   SARSCOV2NAA NEGATIVE 07/06/2020   MRSAPCR NEGATIVE 05/19/2020     Bone Lab Results  Component Value Date   VD25OH 59 06/02/2021     Endocrine Lab Results  Component Value Date   GLUCOSE 86 06/02/2021   GLUCOSEU NEGATIVE 05/18/2020   HGBA1C 5.8 (H) 06/02/2021   TSH 1.71 06/02/2021   FREET4 1.4 03/18/2020     Neuropathy Lab Results  Component Value Date   VITAMINB12 702 06/02/2021   HGBA1C 5.8 (H) 06/02/2021     CNS No results found for: COLORCSF, APPEARCSF, RBCCOUNTCSF, WBCCSF, POLYSCSF, LYMPHSCSF, EOSCSF, PROTEINCSF, GLUCCSF, JCVIRUS, CSFOLI, IGGCSF, LABACHR, ACETBL, LABACHR, ACETBL   Inflammation (CRP: Acute  ESR: Chronic) Lab Results  Component Value Date   LATICACIDVEN 1.3 05/19/2020     Rheumatology No  results found for: RF, ANA, LABURIC, URICUR, LYMEIGGIGMAB, LYMEABIGMQN, HLAB27   Coagulation Lab Results  Component Value Date   PLT 274 06/02/2021     Cardiovascular Lab Results  Component Value Date   TROPONINI <0.03 02/07/2016   HGB 14.6 06/02/2021   HCT 44.2 06/02/2021     Screening Lab Results  Component Value Date   SARSCOV2NAA NEGATIVE 07/06/2020   MRSAPCR NEGATIVE 05/19/2020     Cancer No results found for: CEA, CA125, LABCA2   Allergens No results found for: ALMOND, APPLE, ASPARAGUS, AVOCADO, BANANA, BARLEY, BASIL, BAYLEAF, GREENBEAN, LIMABEAN, WHITEBEAN, BEEFIGE, REDBEET, BLUEBERRY, BROCCOLI, CABBAGE, MELON, CARROT, CASEIN, CASHEWNUT, CAULIFLOWER, CELERY     Note: Lab results reviewed.  [place]  Drug: Arthur Romero  reports no history of drug use. Alcohol:  reports no history of alcohol use. Tobacco:  reports that he has never smoked. He has never used smokeless tobacco. Medical:  has a past medical history of Arthritis, GERD (gastroesophageal reflux disease), Hypertension, Rheumatoid arthritis ([place]), and Thyroid disease. Family: family history includes Diabetes in his brother; Heart attack (age of onset: 12) in his father; Hypertension in his mother; Osteoarthritis in his mother; Parkinson's disease in his mother; Stroke in his brother; Tuberculosis in his father.  Past Surgical History:  Procedure Laterality Date   CARDIAC SURGERY     COLONOSCOPY N/A 07/09/2020   Procedure: COLONOSCOPY;  Surgeon: Jonathon Bellows,  MD;  Location: [facility] ENDOSCOPY;  Service: Gastroenterology;  Laterality: N/A;   ESOPHAGOGASTRODUODENOSCOPY N/A 07/09/2020   Procedure: ESOPHAGOGASTRODUODENOSCOPY (EGD);  Surgeon: Jonathon Bellows, MD;  Location: [facility] ENDOSCOPY;  Service: Gastroenterology;  Laterality: N/A;   SHOULDER SURGERY Right 2016   TYMPANOPLASTY Right 1987   vocal cords  1989   polyps   Active Ambulatory Problems    Diagnosis Date Noted   Acquired hypothyroidism 03/15/2020   Essential  hypertension 03/15/2020   Acquired bilateral pes cavus 03/15/2020   Mixed hyperlipidemia 03/15/2020   Elevated hemoglobin A1c 03/15/2020   Mononeuropathy 03/15/2020   Aortic atherosclerosis ([place]) 04/26/2020   Foot pain, bilateral 04/26/2020   Abdominal pain 05/19/2020   Hyponatremia 05/19/2020   Generalized weakness 05/19/2020   Malnutrition of moderate degree 05/20/2020   Paroxysmal atrial fibrillation ([facility])    Lower GI bleed 07/06/2020   Proctitis    GI bleeding 07/08/2020   DDD (degenerative disc disease), lumbar 01/13/2021   Lumbar foraminal stenosis 01/13/2021   Lumbar spondylosis 07/07/2021   Spinal stenosis, lumbar region, with neurogenic claudication 07/07/2021   Lumbar radiculopathy 07/07/2021   Chronic pain syndrome 07/07/2021   Sensory polyneuropathy 07/07/2021   Resolved Ambulatory Problems    Diagnosis Date Noted   AKI (acute kidney injury) ([place]) 05/19/2020   Sepsis ([place]) 05/19/2020   Acute urinary retention 05/19/2020   Past Medical History:  Diagnosis Date   Arthritis    GERD (gastroesophageal reflux disease)    Hypertension    Rheumatoid arthritis ([place])    Thyroid disease    Constitutional Exam  General appearance: Well nourished, well developed, and well hydrated. In no apparent acute distress Vitals:   07/07/21 1241  BP: 121/80  Pulse: 73  Resp: 16  Temp: (!) 97.1 F (36.2 C)  TempSrc: Temporal  SpO2: 98%  Weight: 172 lb (78 kg)  Height: $Remove'6\' 3"'RDHnkxU$  (1.905 m)   BMI Assessment: Estimated body mass index is 21.5 kg/m as calculated from the following:   Height as of this encounter: $RemoveBeforeD'6\' 3"'[id number]$  (1.905 m).   Weight as of this encounter: 172 lb (78 kg).  BMI interpretation table: BMI level Category Range association with higher incidence of chronic pain  <18 kg/m2 Underweight   18.5-24.9 kg/m2 Ideal body weight   25-29.9 kg/m2 Overweight Increased incidence by 20%  30-34.9 kg/m2 Obese (Class I) Increased incidence by 68%  35-39.9 kg/m2 Severe obesity  (Class II) Increased incidence by 136%  >40 kg/m2 Extreme obesity (Class III) Increased incidence by 254%   Patient's current BMI Ideal Body weight  Body mass index is 21.5 kg/m. Ideal body weight: 84.5 kg (186 lb 4.6 oz)   BMI Readings from Last 4 Encounters:  07/07/21 21.50 kg/m  06/06/21 21.55 kg/m  05/20/21 21.65 kg/m  05/13/21 23.50 kg/m   Wt Readings from Last 4 Encounters:  07/07/21 172 lb (78 kg)  06/06/21 172 lb 6.4 oz (78.2 kg)  05/20/21 173 lb 3.2 oz (78.6 kg)  05/13/21 188 lb (85.3 kg)    Psych/Mental status: Alert, oriented x 3 (person, place, & time)       Eyes: PERLA Respiratory: No evidence of acute respiratory distress  Cervical Spine Area Exam  Skin & Axial Inspection: No masses, redness, edema, swelling, or associated skin lesions Alignment: Symmetrical Functional ROM: Pain restricted ROM      Stability: No instability detected Muscle Tone/Strength: Functionally intact. No obvious neuro-muscular anomalies detected. Sensory (Neurological): Musculoskeletal pain pattern  Upper Extremity (UE) Exam    Side: Right upper  extremity  Side: Left upper extremity  Skin & Extremity Inspection: Skin color, temperature, and hair growth are WNL. No peripheral edema or cyanosis. No masses, redness, swelling, asymmetry, or associated skin lesions. No contractures.  Skin & Extremity Inspection: Skin color, temperature, and hair growth are WNL. No peripheral edema or cyanosis. No masses, redness, swelling, asymmetry, or associated skin lesions. No contractures.  Functional ROM: Pain restricted ROM          Functional ROM: Pain restricted ROM          Muscle Tone/Strength: Functionally intact. No obvious neuro-muscular anomalies detected.  Muscle Tone/Strength: Functionally intact. No obvious neuro-muscular anomalies detected.  Sensory (Neurological): Musculoskeletal pain pattern          Sensory (Neurological): Musculoskeletal pain pattern          Palpation: No palpable  anomalies              Palpation: No palpable anomalies              Provocative Test(s):  Phalen's test: deferred Tinel's test: deferred Apley's scratch test (touch opposite shoulder):  Action 1 (Across chest): Decreased ROM Action 2 (Overhead): Decreased ROM Action 3 (LB reach): Decreased ROM   Provocative Test(s):  Phalen's test: deferred Tinel's test: deferred Apley's scratch test (touch opposite shoulder):  Action 1 (Across chest): Decreased ROM Action 2 (Overhead): Decreased ROM Action 3 (LB reach): Decreased ROM    Thoracic Spine Area Exam  Skin & Axial Inspection: No masses, redness, or swelling Alignment: Asymmetric Functional ROM: Pain restricted ROM Stability: No instability detected Muscle Tone/Strength: Functionally intact. No obvious neuro-muscular anomalies detected. Sensory (Neurological): Musculoskeletal pain pattern Muscle strength & Tone: No palpable anomalies Lumbar Spine Area Exam  Skin & Axial Inspection: No masses, redness, or swelling Alignment: Scoliosis detected Functional ROM: Pain restricted ROM       Stability: No instability detected Muscle Tone/Strength: Functionally intact. No obvious neuro-muscular anomalies detected. Sensory (Neurological): Musculoskeletal pain pattern and dermatomal  Provocative Tests: Hyperextension/rotation test: (+) bilaterally for facet joint pain. Lumbar quadrant test (Kemp's test): (+) bilaterally for facet joint pain.  Gait & Posture Assessment  Ambulation: Patient ambulates using a walker Gait: Significantly limited. Dependent on assistive device to ambulate Posture: Difficulty standing up straight, due to pain  Lower Extremity Exam    Side: Right lower extremity  Side: Left lower extremity  Stability: No instability observed          Stability: No instability observed          Skin & Extremity Inspection: Skin color, temperature, and hair growth are WNL. No peripheral edema or cyanosis. No masses, redness,  swelling, asymmetry, or associated skin lesions. No contractures.  Skin & Extremity Inspection: Skin color, temperature, and hair growth are WNL. No peripheral edema or cyanosis. No masses, redness, swelling, asymmetry, or associated skin lesions. No contractures.  Functional ROM: Pain restricted ROM hip and knee                          Functional ROM: Pain restricted ROM hip and knee                  Muscle Tone/Strength: Functionally intact. No obvious neuro-muscular anomalies detected.  Muscle Tone/Strength: Functionally intact. No obvious neuro-muscular anomalies detected.  Sensory (Neurological): Neuropathic pain pattern        Sensory (Neurological): Neuropathic pain pattern        DTR: Patellar: deferred today  Achilles: deferred today Plantar: deferred today  DTR: Patellar: deferred today Achilles: deferred today Plantar: deferred today  Palpation: No palpable anomalies  Palpation: No palpable anomalies    Assessment  Primary Diagnosis & Pertinent Problem List: The primary encounter diagnosis was Lumbar facet arthropathy. Diagnoses of Lumbar spondylosis, Spinal stenosis, lumbar region, with neurogenic claudication, Lumbar radiculopathy, Sensory polyneuropathy, and Chronic pain syndrome were also pertinent to this visit.  Visit Diagnosis (New problems to examiner): 1. Lumbar facet arthropathy   2. Lumbar spondylosis   3. Spinal stenosis, lumbar region, with neurogenic claudication   4. Lumbar radiculopathy   5. Sensory polyneuropathy   6. Chronic pain syndrome    Plan of Care (Initial workup plan)  I was able to evaluate the patient's interlaminar windows under live fluoroscopy.  He does have significant arthrosis that may limit percutaneous access of his epidural space.  He also has significant thoracic degenerative disc disease as well as thoracic disc herniations.  I informed the patient of this.  He has not had his psychological evaluation for spinal cord stimulator  trial/implant.  I informed the patient that while he could be a candidate, that we could focus on other therapies to help target his low back pain which seems to be the most problematic for him.  I reviewed his lumbar and thoracic MRI.  He does have diffuse multilevel lumbar spondylosis.  His pain increases with lumbar extension and facet loading.  Arthur Romero has a history of greater than 3 months of moderate to severe pain which is resulted in functional impairment.  The patient has tried various conservative therapeutic options such as NSAIDs, Tylenol, muscle relaxants, physical therapy which was inadequately effective.  Patient's pain is predominantly axial with physical exam findings suggestive of facet arthropathy. Lumbar facet medial branch nerve blocks were discussed with the patient.  Risks and benefits were reviewed.  Patient would like to proceed with bilateral L3, L4, L5 medial branch nerve block.  Future considerations include spinal cord stimulator trial however patient needs psych eval.  Orders Placed This Encounter  Procedures   LUMBAR FACET(MEDIAL BRANCH NERVE BLOCK) MBNB    Standing Status:   Future    Standing Expiration Date:   08/06/2021    Scheduling Instructions:     Procedure: Lumbar facet block (AKA.: Lumbosacral medial branch nerve block)     Side: Bilateral     Level: L3-4, L4-5, Facets ( L3, L4, L5, Medial Branch Nerves)     Sedation: without     Timeframe: ASAA    Order Specific Question:   Where will this procedure be performed?    Answer:   [facility]   DG PAIN CLINIC C-ARM 1-60 MIN NO REPORT    Intraoperative interpretation by procedural physician at [place].    Standing Status:   Standing    Number of Occurrences:   1    Order Specific Question:   Reason for exam:    Answer:   Assistance in needle guidance and placement for procedures requiring needle placement in or near specific anatomical locations not easily accessible  without such assistance.      Imaging Orders         DG PAIN CLINIC C-ARM 1-60 MIN NO REPORT     Procedure Orders         LUMBAR FACET(MEDIAL BRANCH NERVE BLOCK) MBNB     Pharmacotherapy (current):Provider-requested follow-up: Return in about 1 week (around 07/14/2021) for B/L L3,4,5 Fct Block #1 (  Stop Xarleto 3 days prior).  I spent a total of 60 minutes reviewing chart data, face-to-face evaluation with the patient, counseling and coordination of care as detailed above.   Future Appointments  Date Time Provider [place]  08/18/2021  9:15 AM Florance, Tomasa Blase, RN [facility] None  12/08/2021 11:00 AM Olin Hauser, DO [facility]  05/19/2022 10:20 AM [facility]- NURSE HEALTH ADVISOR [facility]    Note by: Gillis Santa, MD Date: 07/07/2021; Time: 2:54 PM

## 2021-07-07 NOTE — Progress Notes (Signed)
Safety precautions to be maintained throughout the outpatient stay will include: orient to surroundings, keep bed in low position, maintain call bell within reach at all times, provide assistance with transfer out of bed and ambulation.  

## 2021-07-14 ENCOUNTER — Other Ambulatory Visit: Payer: Self-pay

## 2021-07-14 ENCOUNTER — Encounter: Payer: Self-pay | Admitting: Student in an Organized Health Care Education/Training Program

## 2021-07-14 ENCOUNTER — Ambulatory Visit
Admission: RE | Admit: 2021-07-14 | Discharge: 2021-07-14 | Disposition: A | Discharge status: Home / Self Care | Payer: Medicare PPO | Source: Ambulatory Visit | Attending: Student in an Organized Health Care Education/Training Program | Admitting: Student in an Organized Health Care Education/Training Program

## 2021-07-14 ENCOUNTER — Ambulatory Visit (HOSPITAL_BASED_OUTPATIENT_CLINIC_OR_DEPARTMENT_OTHER): Payer: Medicare PPO | Admitting: Student in an Organized Health Care Education/Training Program

## 2021-07-14 VITALS — BP 158/93 | HR 77 | Temp 96.9°F | Resp 16 | Ht 75.0 in | Wt 180.0 lb

## 2021-07-14 DIAGNOSIS — M47816 Spondylosis without myelopathy or radiculopathy, lumbar region: Secondary | ICD-10-CM

## 2021-07-14 DIAGNOSIS — G894 Chronic pain syndrome: Secondary | ICD-10-CM | POA: Diagnosis not present

## 2021-07-14 MED ORDER — ROPIVACAINE HCL 2 MG/ML IJ SOLN
INTRAMUSCULAR | Status: AC
  Filled 2021-07-14: qty 20

## 2021-07-14 MED ORDER — ROPIVACAINE HCL 2 MG/ML IJ SOLN
9.0000 mL | Freq: Once | INTRAMUSCULAR | Status: AC
  Administered 2021-07-14: 9 mL via PERINEURAL

## 2021-07-14 MED ORDER — LIDOCAINE HCL (PF) 2 % IJ SOLN
INTRAMUSCULAR | Status: AC
  Filled 2021-07-14: qty 5

## 2021-07-14 MED ORDER — DEXAMETHASONE SODIUM PHOSPHATE 10 MG/ML IJ SOLN
10.0000 mg | Freq: Once | INTRAMUSCULAR | Status: AC
  Administered 2021-07-14: 10 mg

## 2021-07-14 MED ORDER — DEXAMETHASONE SODIUM PHOSPHATE 10 MG/ML IJ SOLN
INTRAMUSCULAR | Status: AC
  Filled 2021-07-14: qty 2

## 2021-07-14 MED ORDER — LIDOCAINE HCL 2 % IJ SOLN
20.0000 mL | Freq: Once | INTRAMUSCULAR | Status: AC
  Administered 2021-07-14: 400 mg
  Filled 2021-07-14: qty 20

## 2021-07-14 NOTE — Patient Instructions (Signed)

## 2021-07-14 NOTE — Progress Notes (Signed)
PROVIDER NOTE: Information contained herein reflects review and annotations entered in association with encounter. Interpretation of such information and data should be left to medically-trained personnel. Information provided to patient can be located elsewhere in the medical record under "Patient Instructions". Document created using STT-dictation technology, any transcriptional errors that may result from process are unintentional.    Patient: Arthur Romero  Service Category: Procedure  Provider: Gillis Santa, MD  DOB: 05-30-1941  DOS: 07/14/2021  Location: [facility]  MRN: [id number]  Setting: Ambulatory - outpatient  Referring Provider: Gillis Santa, MD  Type: Established Patient  Specialty: Interventional Pain Management  PCP: Olin Hauser, DO   Primary Reason for Visit: Interventional Pain Management Treatment. CC: Back Pain (lower)    Procedure:          Anesthesia, Analgesia, Anxiolysis:  Type: Lumbar Facet, Medial Branch Block(s) #1  Primary Purpose: Diagnostic Region: Posterolateral Lumbosacral Spine Level: L3, L4, L5, Medial Branch Level(s). Injecting these levels blocks the L3-4, L4-5, lumbar facet joints. Laterality: Bilateral  Type: Local Anesthesia Local Anesthetic: Lidocaine 1-2%    Position: Prone   Indications: 1. Lumbar facet arthropathy   2. Chronic pain syndrome   3. Lumbar spondylosis    Pain Score: Pre-procedure: 6 /10 Post-procedure: 4  (moving,.walking)/10     Pre-op H&P Assessment:  Arthur Romero is a 80 y.o. (year old), male patient, seen today for interventional treatment. He  has a past surgical history that includes Cardiac surgery; Tympanoplasty (Right, 1987); vocal cords (1989); Shoulder surgery (Right, 2016); Esophagogastroduodenoscopy (N/A, 07/09/2020); and Colonoscopy (N/A, 07/09/2020). Arthur Romero has a current medication list which includes the following prescription(s): acetaminophen, allopurinol, atorvastatin,  cholecalciferol, diltiazem, fluticasone, gabapentin, levothyroxine, melatonin, montelukast, multiple vitamins-minerals, omeprazole, polyethylene glycol, rivaroxaban, and tramadol. His primarily concern today is the Back Pain (lower)  Initial Vital Signs:  Pulse/HCG Rate: 73  Temp:  (!) 96.9 F (36.1 C) Resp: 16 BP: 133/86 SpO2: 98 %  BMI: Estimated body mass index is 22.5 kg/m as calculated from the following:   Height as of this encounter: '6\' 3"'$  (1.905 m).   Weight as of this encounter: 180 lb (81.6 kg).  Risk Assessment: Allergies: Reviewed. He is allergic to ace inhibitors and prednisone.  Allergy Precautions: None required Coagulopathies: Reviewed. None identified.  Blood-thinner therapy: None at this time Active Infection(s): Reviewed. None identified. Arthur Romero is afebrile  Site Confirmation: Arthur Romero was asked to confirm the procedure and laterality before marking the site Procedure checklist: Completed Consent: Before the procedure and under the influence of no sedative(s), amnesic(s), or anxiolytics, the patient was informed of the treatment options, risks and possible complications. To fulfill our ethical and legal obligations, as recommended by the American Medical Association's Code of Ethics, I have informed the patient of my clinical impression; the nature and purpose of the treatment or procedure; the risks, benefits, and possible complications of the intervention; the alternatives, including doing nothing; the risk(s) and benefit(s) of the alternative treatment(s) or procedure(s); and the risk(s) and benefit(s) of doing nothing. The patient was provided information about the general risks and possible complications associated with the procedure. These may include, but are not limited to: failure to achieve desired goals, infection, bleeding, organ or nerve damage, allergic reactions, paralysis, and death. In addition, the patient was informed of those risks and  complications associated to Spine-related procedures, such as failure to decrease pain; infection (i.e.: Meningitis, epidural or intraspinal abscess); bleeding (i.e.: epidural hematoma, subarachnoid hemorrhage, or any other  type of intraspinal or peri-dural bleeding); organ or nerve damage (i.e.: Any type of peripheral nerve, nerve root, or spinal cord injury) with subsequent damage to sensory, motor, and/or autonomic systems, resulting in permanent pain, numbness, and/or weakness of one or several areas of the body; allergic reactions; (i.e.: anaphylactic reaction); and/or death. Furthermore, the patient was informed of those risks and complications associated with the medications. These include, but are not limited to: allergic reactions (i.e.: anaphylactic or anaphylactoid reaction(s)); adrenal axis suppression; blood sugar elevation that in diabetics may result in ketoacidosis or comma; water retention that in patients with history of congestive heart failure may result in shortness of breath, pulmonary edema, and decompensation with resultant heart failure; weight gain; swelling or edema; medication-induced neural toxicity; particulate matter embolism and blood vessel occlusion with resultant organ, and/or nervous system infarction; and/or aseptic necrosis of one or more joints. Finally, the patient was informed that Medicine is not an exact science; therefore, there is also the possibility of unforeseen or unpredictable risks and/or possible complications that may result in a catastrophic outcome. The patient indicated having understood very clearly. We have given the patient no guarantees and we have made no promises. Enough time was given to the patient to ask questions, all of which were answered to the patient's satisfaction. Arthur Romero has indicated that he wanted to continue with the procedure. Attestation: I, the ordering provider, attest that I have discussed with the patient the benefits, risks,  side-effects, alternatives, likelihood of achieving goals, and potential problems during recovery for the procedure that I have provided informed consent. Date  Time: 07/14/2021 11:19 AM  Pre-Procedure Preparation:  Monitoring: As per clinic protocol. Respiration, ETCO2, SpO2, BP, heart rate and rhythm monitor placed and checked for adequate function Safety Precautions: Patient was assessed for positional comfort and pressure points before starting the procedure. Time-out: I initiated and conducted the "Time-out" before starting the procedure, as per protocol. The patient was asked to participate by confirming the accuracy of the "Time Out" information. Verification of the correct person, site, and procedure were performed and confirmed by me, the nursing staff, and the patient. "Time-out" conducted as per Joint Commission's Universal Protocol (UP.01.01.01). Time: 1222  Description of Procedure:          Laterality: Bilateral. The procedure was performed in identical fashion on both sides. Levels:  L3, L4, L5, Medial Branch Level(s) Area Prepped: Posterior Lumbosacral Region DuraPrep (Iodine Povacrylex [0.7% available iodine] and Isopropyl Alcohol, 74% w/w) Safety Precautions: Aspiration looking for blood return was conducted prior to all injections. At no point did we inject any substances, as a needle was being advanced. Before injecting, the patient was told to immediately notify me if he was experiencing any new onset of "ringing in the ears, or metallic taste in the mouth". No attempts were made at seeking any paresthesias. Safe injection practices and needle disposal techniques used. Medications properly checked for expiration dates. SDV (single dose vial) medications used. After the completion of the procedure, all disposable equipment used was discarded in the proper designated medical waste containers. Local Anesthesia: Protocol guidelines were followed. The patient was positioned over the  fluoroscopy table. The area was prepped in the usual manner. The time-out was completed. The target area was identified using fluoroscopy. A 12-in long, straight, sterile hemostat was used with fluoroscopic guidance to locate the targets for each level blocked. Once located, the skin was marked with an approved surgical skin marker. Once all sites were marked, the skin (  epidermis, dermis, and hypodermis), as well as deeper tissues (fat, connective tissue and muscle) were infiltrated with a small amount of a short-acting local anesthetic, loaded on a 10cc syringe with a 25G, 1.5-in  Needle. An appropriate amount of time was allowed for local anesthetics to take effect before proceeding to the next step. Local Anesthetic: Lidocaine 2.0% The unused portion of the local anesthetic was discarded in the proper designated containers. Technical explanation of process:  L3 Medial Branch Nerve Block (MBB): The target area for the L3 medial branch is at the junction of the postero-lateral aspect of the superior articular process and the superior, posterior, and medial edge of the transverse process of L4. Under fluoroscopic guidance, a Quincke needle was inserted until contact was made with os over the superior postero-lateral aspect of the pedicular shadow (target area). After negative aspiration for blood, 21m of the nerve block solution was injected without difficulty or complication. The needle was removed intact. L4 Medial Branch Nerve Block (MBB): The target area for the L4 medial branch is at the junction of the postero-lateral aspect of the superior articular process and the superior, posterior, and medial edge of the transverse process of L5. Under fluoroscopic guidance, a Quincke needle was inserted until contact was made with os over the superior postero-lateral aspect of the pedicular shadow (target area). After negative aspiration for blood, 257mof the nerve block solution was injected without difficulty or  complication. The needle was removed intact. L5 Medial Branch Nerve Block (MBB): The target area for the L5 medial branch is at the junction of the postero-lateral aspect of the superior articular process and the superior, posterior, and medial edge of the sacral ala. Under fluoroscopic guidance, a Quincke needle was inserted until contact was made with os over the superior postero-lateral aspect of the pedicular shadow (target area). After negative aspiration for blood, 57m36mf the nerve block solution was injected without difficulty or complication. The needle was removed intact.   Nerve block solution: 12 cc solution made of 10 cc of 0.2% ropivacaine, 2 cc of Decadron 10 mg/cc. 2 cc injected at each level above bilaterally  Procedural Needles: 22-gauge, 3.5-inch, Quincke needles used for all levels.  Once the entire procedure was completed, the treated area was cleaned, making sure to leave some of the prepping solution back to take advantage of its long term bactericidal properties.      Illustration of the posterior view of the lumbar spine and the posterior neural structures. Laminae of L2 through S1 are labeled. DPRL5, dorsal primary ramus of L5; DPRS1, dorsal primary ramus of S1; DPR3, dorsal primary ramus of L3; FJ, facet (zygapophyseal) joint L3-L4; I, inferior articular process of L4; LB1, lateral branch of dorsal primary ramus of L1; IAB, inferior articular branches from L3 medial branch (supplies L4-L5 facet joint); IBP, intermediate branch plexus; MB3, medial branch of dorsal primary ramus of L3; NR3, third lumbar nerve root; S, superior articular process of L5; SAB, superior articular branches from L4 (supplies L4-5 facet joint also); TP3, transverse process of L3.  Vitals:   07/14/21 1220 07/14/21 1225 07/14/21 1230 07/14/21 1235  BP: (!) 148/91 (!) 158/99 (!) 153/92 (!) 158/93  Pulse: 69 73 76 77  Resp: 17 20 (!) 22 16  Temp:      TempSrc:      SpO2: 99% 98% 99% 98%  Weight:       Height:         Start Time: 1222 hrs. End  Time: 1232 hrs.  Imaging Guidance (Spinal):          Type of Imaging Technique: Fluoroscopy Guidance (Spinal) Indication(s): Assistance in needle guidance and placement for procedures requiring needle placement in or near specific anatomical locations not easily accessible without such assistance. Exposure Time: Please see nurses notes. Contrast: None used. Fluoroscopic Guidance: I was personally present during the use of fluoroscopy. "Tunnel Vision Technique" used to obtain the best possible view of the target area. Parallax error corrected before commencing the procedure. "Direction-depth-direction" technique used to introduce the needle under continuous pulsed fluoroscopy. Once target was reached, antero-posterior, oblique, and lateral fluoroscopic projection used confirm needle placement in all planes. Images permanently stored in EMR. Interpretation: No contrast injected. I personally interpreted the imaging intraoperatively. Adequate needle placement confirmed in multiple planes. Permanent images saved into the patient's record.   Post-operative Assessment:  Post-procedure Vital Signs:  Pulse/HCG Rate: 77 (nsr)  Temp:  (!) 96.9 F (36.1 C) Resp: 16 BP:  (!) 158/93 SpO2: 98 %  EBL: None  Complications: No immediate post-treatment complications observed by team, or reported by patient.  Note: The patient tolerated the entire procedure well. A repeat set of vitals were taken after the procedure and the patient was kept under observation following institutional policy, for this type of procedure. Post-procedural neurological assessment was performed, showing return to baseline, prior to discharge. The patient was provided with post-procedure discharge instructions, including a section on how to identify potential problems. Should any problems arise concerning this procedure, the patient was given instructions to immediately contact us, at  any time, without hesitation. In any case, we plan to contact the patient by telephone for a follow-up status report regarding this interventional procedure.  Comments:  No additional relevant information.  Plan of Care  Orders:  Orders Placed This Encounter  Procedures   DG PAIN CLINIC C-ARM 1-60 MIN NO REPORT    Intraoperative interpretation by procedural physician at [place].    Standing Status:   Standing    Number of Occurrences:   1    Order Specific Question:   Reason for exam:    Answer:   Assistance in needle guidance and placement for procedures requiring needle placement in or near specific anatomical locations not easily accessible without such assistance.    Medications ordered for procedure: Meds ordered this encounter  Medications   lidocaine (XYLOCAINE) 2 % (with pres) injection 400 mg   dexamethasone (DECADRON) injection 10 mg   dexamethasone (DECADRON) injection 10 mg   ropivacaine (PF) 2 mg/mL (0.2%) (NAROPIN) injection 9 mL   ropivacaine (PF) 2 mg/mL (0.2%) (NAROPIN) injection 9 mL   Medications administered: We administered lidocaine, dexamethasone, dexamethasone, ropivacaine (PF) 2 mg/mL (0.2%), and ropivacaine (PF) 2 mg/mL (0.2%).  See the medical record for exact dosing, route, and time of administration.  Follow-up plan:   Return in about 4 weeks (around 08/11/2021) for Post Procedure Evaluation, virtual.       Bilateral L3,4,5 Fct Block #1 07/14/21  Recent Visits Date Type Provider Dept  07/07/21 Office Visit Gillis Santa, MD [facility]  Showing recent visits within past 90 days and meeting all other requirements Today's Visits Date Type Provider Dept  07/14/21 Procedure visit Gillis Santa, MD [facility]  Showing today's visits and meeting all other requirements Future Appointments Date Type Provider Dept  08/11/21 Appointment Gillis Santa, MD [facility]  Showing future appointments within next 90  days and meeting all other  requirements Disposition: Discharge home  Discharge (Date  Time): 07/14/2021; 1239 hrs.   Primary Care Physician: Olin Hauser, DO Location: [facility] Note by: Gillis Santa, MD Date: 07/14/2021; Time: 1:22 PM  Disclaimer:  Medicine is not an exact science. The only guarantee in medicine is that nothing is guaranteed. It is important to note that the decision to proceed with this intervention was based on the information collected from the patient. The Data and conclusions were drawn from the patient's questionnaire, the interview, and the physical examination. Because the information was provided in large part by the patient, it cannot be guaranteed that it has not been purposely or unconsciously manipulated. Every effort has been made to obtain as much relevant data as possible for this evaluation. It is important to note that the conclusions that lead to this procedure are derived in large part from the available data. Always take into account that the treatment will also be dependent on availability of resources and existing treatment guidelines, considered by other Pain Management Practitioners as being common knowledge and practice, at the time of the intervention. For Medico-Legal purposes, it is also important to point out that variation in procedural techniques and pharmacological choices are the acceptable norm. The indications, contraindications, technique, and results of the above procedure should only be interpreted and judged by a Board-Certified Interventional Pain Specialist with extensive familiarity and expertise in the same exact procedure and technique.

## 2021-07-14 NOTE — Progress Notes (Signed)
Safety precautions to be maintained throughout the outpatient stay will include: orient to surroundings, keep bed in low position, maintain call bell within reach at all times, provide assistance with transfer out of bed and ambulation.  

## 2021-07-15 ENCOUNTER — Telehealth: Payer: Self-pay

## 2021-07-15 NOTE — Telephone Encounter (Signed)
Post procedure phone call. Patient states he is doing well.  

## 2021-07-17 ENCOUNTER — Telehealth: Payer: Self-pay | Admitting: Student in an Organized Health Care Education/Training Program

## 2021-07-17 NOTE — Telephone Encounter (Signed)
Patient is calling about how he is feeling since having injections on Monday. He asks if it is supposed to mess with your head, make it feel funny? Mess with his legs. Please call patient with advice

## 2021-07-17 NOTE — Telephone Encounter (Signed)
States feels dizzy, and legs feel weak. I informed him that the facet block may have caused weakness in the legs, that it should improve with time. I also informed him that likely the facet block would not cause dizziness. I advised him to see his PCP regardiing the dizziness.

## 2021-08-01 DIAGNOSIS — M5126 Other intervertebral disc displacement, lumbar region: Secondary | ICD-10-CM | POA: Diagnosis not present

## 2021-08-01 DIAGNOSIS — M5136 Other intervertebral disc degeneration, lumbar region: Secondary | ICD-10-CM | POA: Diagnosis not present

## 2021-08-01 DIAGNOSIS — M5416 Radiculopathy, lumbar region: Secondary | ICD-10-CM | POA: Diagnosis not present

## 2021-08-01 DIAGNOSIS — M48061 Spinal stenosis, lumbar region without neurogenic claudication: Secondary | ICD-10-CM | POA: Diagnosis not present

## 2021-08-01 DIAGNOSIS — G629 Polyneuropathy, unspecified: Secondary | ICD-10-CM | POA: Diagnosis not present

## 2021-08-11 ENCOUNTER — Ambulatory Visit
Payer: Medicare PPO | Attending: Student in an Organized Health Care Education/Training Program | Admitting: Student in an Organized Health Care Education/Training Program

## 2021-08-11 ENCOUNTER — Other Ambulatory Visit: Payer: Self-pay

## 2021-08-11 DIAGNOSIS — M47816 Spondylosis without myelopathy or radiculopathy, lumbar region: Secondary | ICD-10-CM | POA: Diagnosis not present

## 2021-08-11 DIAGNOSIS — G894 Chronic pain syndrome: Secondary | ICD-10-CM

## 2021-08-11 NOTE — Progress Notes (Signed)
Patient: Arthur Romero  Service Category: E/M  Provider: Gillis Santa, MD  DOB: January 15, 1941  DOS: 08/11/2021  Location: Office  MRN: [id number]  Setting: Ambulatory outpatient  Referring Provider: [name] *  Type: Established Patient  Specialty: Interventional Pain Management  PCP: Olin Hauser, DO  Location: Home  Delivery: TeleHealth     Virtual Encounter - Pain Management PROVIDER NOTE: Information contained herein reflects review and annotations entered in association with encounter. Interpretation of such information and data should be left to medically-trained personnel. Information provided to patient can be located elsewhere in the medical record under "Patient Instructions". Document created using STT-dictation technology, any transcriptional errors that may result from process are unintentional.    Contact & Pharmacy Preferred: [phone] Home: [phone] (home) Mobile: There is no such number on file (mobile). E-mail: No e-mail address on record  [place], [place] [address] Phone: [phone] Fax: [phone]  [place], [place] [address] Phone: [phone] Fax: [phone]   Pre-screening  Arthur Romero offered "in-person" vs "virtual" encounter. He indicated preferring virtual for this encounter.   Reason COVID-19*  Social distancing based on CDC and AMA recommendations.   I contacted Arthur Romero on 08/11/2021 via telephone.      I clearly identified myself as Gillis Santa, MD. I verified that I was speaking with the correct person using two identifiers (Name: Arthur Romero, and date of birth: 14-Jun-1941).  Consent I sought verbal advanced consent from Arthur Romero for virtual visit interactions. I informed Arthur Romero of possible security and privacy concerns, risks, and  limitations associated with providing "not-in-person" medical evaluation and management services. I also informed Arthur Romero of the availability of "in-person" appointments. Finally, I informed him that there would be a charge for the virtual visit and that he could be  personally, fully or partially, financially responsible for it. Arthur Romero expressed understanding and agreed to proceed.   Historic Elements   Arthur Romero is a 80 y.o. year old, male patient evaluated today after our last contact on 07/17/2021. Arthur Romero  has a past medical history of Arthritis, GERD (gastroesophageal reflux disease), Hypertension, Rheumatoid arthritis ([place]), and Thyroid disease. He also  has a past surgical history that includes Cardiac surgery; Tympanoplasty (Right, 1987); vocal cords (1989); Shoulder surgery (Right, 2016); Esophagogastroduodenoscopy (N/A, 07/09/2020); and Colonoscopy (N/A, 07/09/2020). Arthur Romero has a current medication list which includes the following prescription(s): acetaminophen, allopurinol, atorvastatin, cholecalciferol, diltiazem, fluticasone, gabapentin, levothyroxine, melatonin, montelukast, multiple vitamins-minerals, omeprazole, polyethylene glycol, rivaroxaban, and tramadol. He  reports that he has never smoked. He has never used smokeless tobacco. He reports that he does not drink alcohol and does not use drugs. Arthur Romero is allergic to ace inhibitors and prednisone.   HPI  Today, he is being contacted for a post-procedure assessment.   Post-Procedure Evaluation  Procedure (07/14/2021):   Type: Lumbar Facet, Medial Branch Block(s) #1  Primary Purpose: Diagnostic Region: Posterolateral Lumbosacral Spine Level: L3, L4, L5, Medial Branch Level(s). Injecting these levels blocks the L3-4, L4-5, lumbar facet joints. Laterality: Bilateral  Anxiolysis: Please see nurses note.  Effectiveness during initial hour after procedure (Ultra-Short Term Relief): 70 %   Local  anesthetic used: Long-acting (4-6 hours) Effectiveness: Defined as any analgesic benefit obtained secondary to the administration of local anesthetics. This carries  significant diagnostic value as to the etiological location, or anatomical origin, of the pain. Duration of benefit is expected to coincide with the duration of the local anesthetic used.  Effectiveness during initial 4-6 hours after procedure (Short-Term Relief): 50 %   Long-term benefit: Defined as any relief past the pharmacologic duration of the local anesthetics.  Effectiveness past the initial 6 hours after procedure (Long-Term Relief):  50%  Benefits, current: Defined as benefit present at the time of this evaluation.   Analgesia:  40-50% Function: Arthur Romero reports improvement in function ROM: Arthur Romero reports improvement in ROM    Laboratory Chemistry Profile   Renal Lab Results  Component Value Date   BUN 15 06/02/2021   CREATININE 0.97 06/02/2021   LABCREA 170 65/53/7482   BCR NOT APPLICABLE 70/78/6754   GFRAA 97 07/15/2020   GFRNONAA 84 07/15/2020    Hepatic Lab Results  Component Value Date   AST 17 06/02/2021   ALT 15 06/02/2021   ALBUMIN 3.6 07/10/2020   ALKPHOS 80 07/10/2020   LIPASE 29 05/18/2020    Electrolytes Lab Results  Component Value Date   NA 141 06/02/2021   K 4.6 06/02/2021   CL 102 06/02/2021   CALCIUM 9.8 06/02/2021   MG 1.8 07/10/2020   PHOS 4.3 07/10/2020    Bone Lab Results  Component Value Date   VD25OH 59 06/02/2021    Inflammation (CRP: Acute Phase) (ESR: Chronic Phase) Lab Results  Component Value Date   LATICACIDVEN 1.3 05/19/2020         Note: Above Lab results reviewed.   Assessment  The primary encounter diagnosis was Lumbar facet arthropathy. Diagnoses of Lumbar spondylosis and Chronic pain syndrome were also pertinent to this visit.  Plan of Care    Arthur Romero has a current medication list which includes the following long-term  medication(s): allopurinol, atorvastatin, diltiazem, fluticasone, gabapentin, levothyroxine, montelukast, omeprazole, and rivaroxaban.  Arthur Romero endorses analgesic and functional benefit after his first set of bilateral lumbar facet medial branch nerve block.  He rates pain relief as much as 50% and improvement in his ability to rotate and turn.  He is continuing to have burning and tingling pain in his feet.  I informed him that the facet medial branch nerve blocks are more for his low back pain.  Given that his low back pain is returning, we discussed repeating medial branch nerve block #2 and if effective can consider therapeutic lumbar radiofrequency ablation or even Sprint peripheral nerve stimulation of medial branch nerve.  Risks and benefits reviewed and patient like to proceed.  We will plan on doing this without sedation as he did last time.  Patient instructed to stop Xarelto 3 days prior to scheduled procedure as he did last time.  Patient endorsed understanding.  Orders:  Orders Placed This Encounter  Procedures   LUMBAR FACET(MEDIAL BRANCH NERVE BLOCK) MBNB    Standing Status:   Future    Standing Expiration Date:   09/11/2021    Scheduling Instructions:     Procedure: Lumbar facet block (AKA.: Lumbosacral medial branch nerve block)     Side: Bilateral     Level: L3-4, L4-5,  Facets (L3, L4, L5, Medial Branch Nerves)     Sedation: without     Timeframe: ASAA    Order Specific Question:   Where will this procedure be performed?    Answer:   [facility]   Follow-up plan:   Return in about 3  weeks (around 09/01/2021) for B/L L3, 4, 5 Fcts #2, without sedation (stop Xarelto 3 days prior ).     Bilateral L3,4,5 Fct Block #1 07/14/21   Recent Visits Date Type Provider Dept  07/14/21 Procedure visit Gillis Santa, MD [facility]  07/07/21 Office Visit Gillis Santa, MD [facility]  Showing recent visits within past 90 days and meeting all other  requirements Today's Visits Date Type Provider Dept  08/11/21 Office Visit Gillis Santa, MD [facility]  Showing today's visits and meeting all other requirements Future Appointments No visits were found meeting these conditions. Showing future appointments within next 90 days and meeting all other requirements I discussed the assessment and treatment plan with the patient. The patient was provided an opportunity to ask questions and all were answered. The patient agreed with the plan and demonstrated an understanding of the instructions.  Patient advised to call back or seek an in-person evaluation if the symptoms or condition worsens.  Duration of encounter: 73mnutes.  Note by: BGillis Santa MD Date: 08/11/2021; Time: 2:55 PM

## 2021-08-15 ENCOUNTER — Ambulatory Visit: Payer: Medicare PPO | Admitting: Family Medicine

## 2021-08-15 ENCOUNTER — Other Ambulatory Visit: Payer: Self-pay

## 2021-08-15 ENCOUNTER — Encounter: Payer: Self-pay | Admitting: Family Medicine

## 2021-08-15 ENCOUNTER — Other Ambulatory Visit: Payer: Self-pay | Admitting: Family Medicine

## 2021-08-15 VITALS — BP 138/71 | HR 63 | Ht 75.0 in | Wt 173.0 lb

## 2021-08-15 DIAGNOSIS — R42 Dizziness and giddiness: Secondary | ICD-10-CM

## 2021-08-15 DIAGNOSIS — H9311 Tinnitus, right ear: Secondary | ICD-10-CM | POA: Diagnosis not present

## 2021-08-15 DIAGNOSIS — Z23 Encounter for immunization: Secondary | ICD-10-CM | POA: Diagnosis not present

## 2021-08-15 DIAGNOSIS — G589 Mononeuropathy, unspecified: Secondary | ICD-10-CM

## 2021-08-15 DIAGNOSIS — T887XXA Unspecified adverse effect of drug or medicament, initial encounter: Secondary | ICD-10-CM

## 2021-08-15 DIAGNOSIS — I480 Paroxysmal atrial fibrillation: Secondary | ICD-10-CM

## 2021-08-15 NOTE — Progress Notes (Signed)
Subjective:    Patient ID: Arthur Romero, male    DOB: 10/05/1941, 80 y.o.   MRN: [id number]  Arthur Romero is a 80 y.o. male presenting on 08/15/2021 for Dizziness and Tinnitus   HPI  Tinnitus Chronic Hearing Loss He has upcoming apt with ENT or Audiology for hearing aid. He has had prior procedure on R ear drum with scar tissue, he had a patch on ear drum before. Unsure if anything changed or why worse now. Does not have hearing aid today. It is at home.  Dizziness On Gabapentin 161m 1 in AM and 1 in PM and 2 in bedtime. He takes it for neuropathy. Thinks this may be causing dizziness as side effect.  Lumbar DDD He follows with Spine Specialist / Pain Management Has had spinal injections.    [facility] 05/13/2021 03/10/2021 02/19/2021  Decreased Interest 0 1 0  Down, Depressed, Hopeless 0 0 0  PHQ - 2 Score 0 1 0  Altered sleeping - 0 0  Tired, decreased energy - 0 0  Change in appetite - 0 0  Feeling bad or failure about yourself  - 0 0  Trouble concentrating - 0 0  Moving slowly or fidgety/restless - 0 0  Suicidal thoughts - 0 0  PHQ-9 Score - 1 0  Difficult doing work/chores - - Not difficult at all    Social History   Tobacco Use   Smoking status: Never   Smokeless tobacco: Never  Vaping Use   Vaping Use: Never used  Substance Use Topics   Alcohol use: Never    Alcohol/week: 1.0 standard drink    Types: 1 Glasses of wine per week   Drug use: Never    Review of Systems Per HPI unless specifically indicated above     Objective:    BP 138/71   Pulse 63   Ht _0  (1.905 m)   Wt 173 lb (78.5 kg)   SpO2 100%   BMI 21.62 kg/m   Wt Readings from Last 3 Encounters:  08/15/21 173 lb (78.5 kg)  07/14/21 180 lb (81.6 kg)  07/07/21 172 lb (78 kg)    Physical Exam Vitals and nursing note reviewed.  Constitutional:      General: He is not in acute distress.    Appearance: Normal appearance. He is well-developed. He is not  diaphoretic.     Comments: Well-appearing, comfortable, cooperative  HENT:     Head: Normocephalic and atraumatic.     Right Ear: External ear normal. There is no impacted cerumen.     Left Ear: External ear normal. There is no impacted cerumen.     Ears:     Comments: R TM with some scar tissue / patch Eyes:     General:        Right eye: No discharge.        Left eye: No discharge.     Conjunctiva/sclera: Conjunctivae normal.  Cardiovascular:     Rate and Rhythm: Normal rate.  Pulmonary:     Effort: Pulmonary effort is normal.  Skin:    General: Skin is warm and dry.     Findings: No erythema or rash.  Neurological:     Mental Status: He is alert and oriented to person, place, and time.  Psychiatric:        Mood and Affect: Mood normal.        Behavior: Behavior normal.  Thought Content: Thought content normal.     Comments: Well groomed, good eye contact, normal speech and thoughts     Results for orders placed or performed in visit on 06/02/21  Vitamin B12  Result Value Ref Range   Vitamin B-12 702 200 - 1,100 pg/mL  VITAMIN D 25 Hydroxy (Vit-D Deficiency, Fractures)  Result Value Ref Range   Vit D, 25-Hydroxy 59 30 - 100 ng/mL  TSH  Result Value Ref Range   TSH 1.71 0.40 - 4.50 mIU/L  PSA  Result Value Ref Range   PSA 0.45 < OR = 4.00 ng/mL  Lipid panel  Result Value Ref Range   Cholesterol 131 <200 mg/dL   HDL 62 > OR = 40 mg/dL   Triglycerides 88 <150 mg/dL   LDL Cholesterol (Calc) 52 mg/dL (calc)   Total CHOL/HDL Ratio 2.1 <5.0 (calc)   Non-HDL Cholesterol (Calc) 69 <130 mg/dL (calc)  COMPLETE METABOLIC PANEL WITH GFR  Result Value Ref Range   Glucose, Bld 86 65 - 99 mg/dL   BUN 15 7 - 25 mg/dL   Creat 0.97 0.70 - 1.28 mg/dL   eGFR 80 > OR = 60 mL/min/1.98m   BUN/Creatinine Ratio NOT APPLICABLE 6 - 22 (calc)   Sodium 141 135 - 146 mmol/L   Potassium 4.6 3.5 - 5.3 mmol/L   Chloride 102 98 - 110 mmol/L   CO2 29 20 - 32 mmol/L   Calcium 9.8  8.6 - 10.3 mg/dL   Total Protein 6.9 6.1 - 8.1 g/dL   Albumin 4.4 3.6 - 5.1 g/dL   Globulin 2.5 1.9 - 3.7 g/dL (calc)   AG Ratio 1.8 1.0 - 2.5 (calc)   Total Bilirubin 0.6 0.2 - 1.2 mg/dL   Alkaline phosphatase (APISO) 80 35 - 144 U/L   AST 17 10 - 35 U/L   ALT 15 9 - 46 U/L  CBC with Differential/Platelet  Result Value Ref Range   WBC 9.5 3.8 - 10.8 Thousand/uL   RBC 4.61 4.20 - 5.80 Million/uL   Hemoglobin 14.6 13.2 - 17.1 g/dL   HCT 44.2 38.5 - 50.0 %   MCV 95.9 80.0 - 100.0 fL   MCH 31.7 27.0 - 33.0 pg   MCHC 33.0 32.0 - 36.0 g/dL   RDW 12.9 11.0 - 15.0 %   Platelets 274 140 - 400 Thousand/uL   MPV 9.4 7.5 - 12.5 fL   Neutro Abs 6,299 1,500 - 7,800 cells/uL   Lymphs Abs 2,147 850 - 3,900 cells/uL   Absolute Monocytes 789 200 - 950 cells/uL   Eosinophils Absolute 219 15 - 500 cells/uL   Basophils Absolute 48 0 - 200 cells/uL   Neutrophils Relative % 66.3 %   Total Lymphocyte 22.6 %   Monocytes Relative 8.3 %   Eosinophils Relative 2.3 %   Basophils Relative 0.5 %  Hemoglobin A1c  Result Value Ref Range   Hgb A1c MFr Bld 5.8 (H) <5.7 % of total Hgb   Mean Plasma Glucose 120 mg/dL   eAG (mmol/L) 6.6 mmol/L      Assessment & Plan:   Problem List Items Addressed This Visit     Mononeuropathy   Relevant Medications   gabapentin (NEURONTIN) 100 MG capsule   Other Visit Diagnoses     Tinnitus of right ear    -  Primary   Medication side effect       Dizziness       Needs flu shot  Relevant Orders   Flu Vaccine QUAD High Dose(Fluad)       Check multi vitamin, if it doesn't have any Magnesium in it you can add in a low dose OTC Magnesium supplement  Check with your ENT / Audiology on the tinnitus and hearing. There is scar tissue on R ear drum from surgical repair / patch in past.  Reduce Gabapentin SKIP afternoon dose. Or we can stop Gabapentin in AM and do 3 at night if you prefer.  If this doesn't work - call and we can order a new dose of Diltiazem  184m instead of 180 (24 hour version)   No orders of the defined types were placed in this encounter.    Follow up plan: Return if symptoms worsen or fail to improve.   ANobie Putnam DO [facility] 08/15/2021, 1:56 PM

## 2021-08-15 NOTE — Patient Instructions (Addendum)
Thank you for coming to the office today.  Check multi vitamin, if it doesn't have any Magnesium in it you can add in a low dose OTC Magnesium supplement  Check with your ENT / Audiology on the tinnitus and hearing. There is scar tissue on R ear drum.  Reduce Gabapentin SKIP afternoon dose. Or we can stop Gabapentin in AM and do 3 at night if you prefer.  If this doesn't work - call and we can order a new dose of Diltiazem 120mg  instead of 180 (24 hour version)  Please schedule a Follow-up Appointment to: Return if symptoms worsen or fail to improve.  If you have any other questions or concerns, please feel free to call the office or send a message through [facility]. You may also schedule an earlier appointment if necessary.  Additionally, you may be receiving a survey about your experience at our office within a few days to 1 week by e-mail or mail. We value your feedback.  [name], DO [place]

## 2021-08-15 NOTE — Telephone Encounter (Signed)
Requested Prescriptions  Pending Prescriptions Disp Refills  . XARELTO 15 MG TABS tablet [Pharmacy Med Name: XARELTO 15 MG TABLET] 90 tablet 1    Sig: Take 1 tablet (15 mgtotal) by mouth daily with supper.     Hematology: Anticoagulants - rivaroxaban Passed - 08/15/2021  8:47 AM      Passed - ALT in normal range and within 180 days    ALT  Date Value Ref Range Status  06/02/2021 15 9 - 46 U/L Final         Passed - AST in normal range and within 180 days    AST  Date Value Ref Range Status  06/02/2021 17 10 - 35 U/L Final         Passed - Cr in normal range and within 360 days    Creat  Date Value Ref Range Status  06/02/2021 0.97 0.70 - 1.28 mg/dL Final   Creatinine, Urine  Date Value Ref Range Status  05/18/2020 170 mg/dL Final    Comment:    Performed at [facility], [address], [place], [place] [address]         Passed - HCT in normal range and within 360 days    HCT  Date Value Ref Range Status  06/02/2021 44.2 38.5 - 50.0 % Final         Passed - HGB in normal range and within 360 days    Hemoglobin  Date Value Ref Range Status  06/02/2021 14.6 13.2 - 17.1 g/dL Final         Passed - PLT in normal range and within 360 days    Platelets  Date Value Ref Range Status  06/02/2021 274 140 - 400 Thousand/uL Final         Passed - Valid encounter within last 12 months    Recent Outpatient Visits          Today Tinnitus of right ear   [place], DO   2 months ago Annual physical exam   [place], DO   2 months ago Paroxysmal atrial fibrillation [facility] [place])   [facility] [place], Devonne Doughty, DO   5 months ago Acute non-recurrent frontal sinusitis   [place], DO   5 months ago Acute non-recurrent frontal sinusitis   [place], DO      Future Appointments             In 3 months [facility], Devonne Doughty, [facility], [place]   In 9 months  [facility], [facility]

## 2021-08-18 ENCOUNTER — Other Ambulatory Visit: Payer: Self-pay

## 2021-08-18 NOTE — Patient Outreach (Signed)
[facility]) Care Management  08/18/2021  Arthur Romero [birth year] [id number]   Telephone Assessment   Successful outreach call to patient. He is happy to report that he got a new hearing aide. However, he has been having some ringing in that ear and things he may need settings  adjusted. He has appt with specialist this week.  He denies any RN CM needs or concerns at this time.    Medications Reviewed Today     Reviewed by Hayden Pedro, RN (Registered Nurse) on 08/18/21 at 1104  Med List Status: <None>   Medication Order Taking? Sig Documenting Provider Last Dose Status Informant  acetaminophen (TYLENOL) 500 MG tablet [id number] No Take 500-1,000 mg by mouth every 6 (six) hours as needed for mild pain or fever.  [provider] Taking Active Other  allopurinol (ZYLOPRIM) 300 MG tablet [id number] No TAKE 1 TABLET EVERY DAY Olin Hauser, DO Taking Active   atorvastatin (LIPITOR) 40 MG tablet [id number] No TAKE 1 TABLET EVERY DAY [place], Devonne Doughty, DO Taking Active   Cholecalciferol (VITAMIN D3 PO) [id number] No Take by mouth daily. [provider] Taking Active   diltiazem (CARDIZEM CD) 180 MG 24 hr capsule [id number] No Take 1 capsule (180 mg total) by mouth daily. Olin Hauser, DO Taking Active   fluticasone (FLONASE) 50 MCG/ACT nasal spray [id number] No Place 2 sprays into both nostrils daily. Use for 4-6 weeks then stop and use seasonally or as needed. Olin Hauser, DO Taking Active   gabapentin (NEURONTIN) 100 MG capsule [id number]  TAKE 1 CAPSULE IN THE MORNING,  AND 2 CAPSULES AT BEDTIME. Olin Hauser, DO  Active   levothyroxine (SYNTHROID) 88 MCG tablet [id number] No TAKE 1 TABLET (88 MCG TOTAL) BY MOUTH DAILY. Olin Hauser, DO Taking Active   melatonin 3 MG TABS tablet [id number] No Take 3 mg by mouth at bedtime as needed (sleep). [provider] Taking Active  Self  montelukast (SINGULAIR) 10 MG tablet [id number] No Take 1 tablet (10 mg total) by mouth at bedtime. Olin Hauser, DO Taking Active   Multiple Vitamins-Minerals (CENTRUM SILVER 50+MEN PO) [id number] No Take 1 tablet by mouth daily. [provider] Taking Active   omeprazole (PRILOSEC) 40 MG capsule [id number] No Take 1 capsule (40 mg total) by mouth daily. Olin Hauser, DO Taking Active   polyethylene glycol (MIRALAX / GLYCOLAX) 17 g packet [id number] No Take 17 g by mouth in the morning and at bedtime. [provider] Taking Active   traMADol (ULTRAM) 50 MG tablet [id number] No Take 25-50 mg by mouth 2 (two) times daily as needed. [provider] Taking Active   XARELTO 15 MG TABS tablet [id number]  Take 1 tablet (15 mgtotal) by mouth daily with supper. Olin Hauser, DO  Active              Goals Addressed               This Visit's Progress     (THN)Prevent Falls and Injury (pt-stated)        Timeframe:  Long-Range Goal Priority:  High Start Date:   02/17/21                          Expected End Date:  Jan 2023                   Follow Up Date: Jan 2023  Barriers: Health Behaviors Knowledge    - always use handrails on the stairs - keep my cell phone with me always - learn how to get back up if I fall - make an emergency alert plan in case I fall - use a cane or walker - wear my glasses and/or hearing aid    Why is this important?   Most falls happen when it is hard for you to walk safely. Your balance may be off because of an illness. You may have pain in your knees, hip or other joints.  You may be overly tired or taking medicines that make you sleepy. You may not be able to see or hear clearly.  Falls can lead to broken bones, bruises or other injuries.  There are things you can do to help prevent falling.     Notes:   02/17/21-Patient reports two falls over the past weekend attributes it to "ringing in  ears" that he is having. He is going today for neuro follow up appt and has PCP appt later this week. He has cane and voices using it all the time with ambulation.   02/27/21-Patient completed follow up appt with both PCP and neuro regarding recent fall and ear issues. His Gabapentin was adjusted and he was started on abx therapy. Patient reports he can not tell a big difference in sxs improvement yet. He is aware to follow back up with MD if sxs worsen and/or unresolved. He states that he will probably go ahead and make ENT appt as well. He has had no further falls. He is using DME to assist with ambulation. Patient has life alert device in the home as well.  06/16/21-Patient reports he went to see spinal MD as he was told her had "crack in vertebrae." He goes back to see MD on 07/02/21 to discuss tx options. 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 He is using DME to assist with ambulation. Denies any recent falls since last talking with RN CM.   08/18/21 Patient confirms he has had no recent falls. He is using cane to aide in ambulation. He has been able to get out and go outdoors and leave the home. He has life alert device in he event of an emergency. He continues to remain fairly independent with ADL/IADLs. Supportive sister checks on him often. He saw MD recently and states he had some "injections" placed in his back to help with leg/feet neuropathy. He feels like it helped a little.         Plan: RN CM discussed with patient next outreach within the month of Jan. Patient agrees to care plan and follow up.   Enzo Montgomery, RN,BSN,CCM [facility] Telephonic Care Management Coordinator Direct Phone: [phone] Toll Free: [phone] Fax: [phone]

## 2021-08-25 ENCOUNTER — Encounter: Payer: Self-pay | Admitting: Student in an Organized Health Care Education/Training Program

## 2021-08-25 ENCOUNTER — Ambulatory Visit
Admission: RE | Admit: 2021-08-25 | Discharge: 2021-08-25 | Disposition: A | Discharge status: Home / Self Care | Payer: Medicare PPO | Source: Ambulatory Visit | Attending: Student in an Organized Health Care Education/Training Program | Admitting: Student in an Organized Health Care Education/Training Program

## 2021-08-25 ENCOUNTER — Other Ambulatory Visit: Payer: Self-pay

## 2021-08-25 ENCOUNTER — Ambulatory Visit: Payer: Medicare PPO | Admitting: Student in an Organized Health Care Education/Training Program

## 2021-08-25 DIAGNOSIS — M47816 Spondylosis without myelopathy or radiculopathy, lumbar region: Secondary | ICD-10-CM | POA: Diagnosis not present

## 2021-08-25 DIAGNOSIS — G894 Chronic pain syndrome: Secondary | ICD-10-CM | POA: Diagnosis not present

## 2021-08-25 MED ORDER — LIDOCAINE HCL 2 % IJ SOLN
20.0000 mL | Freq: Once | INTRAMUSCULAR | Status: AC
  Administered 2021-08-25: 400 mg
  Filled 2021-08-25: qty 20

## 2021-08-25 MED ORDER — ROPIVACAINE HCL 2 MG/ML IJ SOLN
9.0000 mL | Freq: Once | INTRAMUSCULAR | Status: AC
  Administered 2021-08-25: 9 mL via PERINEURAL

## 2021-08-25 MED ORDER — DEXAMETHASONE SODIUM PHOSPHATE 10 MG/ML IJ SOLN
10.0000 mg | Freq: Once | INTRAMUSCULAR | Status: AC
  Administered 2021-08-25: 10 mg
  Filled 2021-08-25: qty 1

## 2021-08-25 NOTE — Progress Notes (Signed)
PROVIDER NOTE: Information contained herein reflects review and annotations entered in association with encounter. Interpretation of such information and data should be left to medically-trained personnel. Information provided to patient can be located elsewhere in the medical record under "Patient Instructions". Document created using STT-dictation technology, any transcriptional errors that may result from process are unintentional.    Patient: Arthur Romero  Service Category: Procedure  Provider: Gillis Santa, MD  DOB: [birth year]  DOS: 08/25/2021  Location: [facility]  MRN: [id number]  Setting: Ambulatory - outpatient  Referring Provider: Gillis Santa, MD  Type: Established Patient  Specialty: Interventional Pain Management  PCP: Olin Hauser, DO   Primary Reason for Visit: Interventional Pain Management Treatment. CC: Back Pain (Lumbar bilateral )    Procedure:          Anesthesia, Analgesia, Anxiolysis:  Type: Lumbar Facet, Medial Branch Block(s) #2  Primary Purpose: Diagnostic Region: Posterolateral Lumbosacral Spine Level: L3, L4, L5, Medial Branch Level(s). Injecting these levels blocks the L3-4, L4-5, lumbar facet joints. Laterality: Bilateral  Type: Local Anesthesia Local Anesthetic: Lidocaine 1-2%    Position: Prone   Indications: 1. Lumbar facet arthropathy   2. Lumbar spondylosis   3. Chronic pain syndrome    Pain Score: Pre-procedure: 3 /10 Post-procedure: 3 /10   Last dose of Xarelto was 3 days ago.    Pre-op H&P Assessment:  Arthur Romero is a 80 y.o. (year old), male patient, seen today for interventional treatment. He  has a past surgical history that includes Cardiac surgery; Tympanoplasty (Right, 1987); vocal cords (1989); Shoulder surgery (Right, 2016); Esophagogastroduodenoscopy (N/A, 07/09/2020); and Colonoscopy (N/A, 07/09/2020). Arthur Romero has a current medication list which includes the following prescription(s):  acetaminophen, allopurinol, atorvastatin, cholecalciferol, diltiazem, fluticasone, gabapentin, levothyroxine, melatonin, montelukast, multiple vitamins-minerals, omeprazole, polyethylene glycol, xarelto, and tramadol. His primarily concern today is the Back Pain (Lumbar bilateral )  Initial Vital Signs:  Pulse/HCG Rate: 72ECG Heart Rate: 69 Temp:  (!) 97.1 F (36.2 C) Resp: 16 BP: 128/71 SpO2: 100 %  BMI: Estimated body mass index is 21.62 kg/m as calculated from the following:   Height as of this encounter: 6\' 3"  (1.905 m).   Weight as of this encounter: 173 lb (78.5 kg).  Risk Assessment: Allergies: Reviewed. He is allergic to ace inhibitors and prednisone.  Allergy Precautions: None required Coagulopathies: Reviewed. None identified.  Blood-thinner therapy: None at this time Active Infection(s): Reviewed. None identified. Arthur Romero is afebrile  Site Confirmation: Mr. Comes was asked to confirm the procedure and laterality before marking the site Procedure checklist: Completed Consent: Before the procedure and under the influence of no sedative(s), amnesic(s), or anxiolytics, the patient was informed of the treatment options, risks and possible complications. To fulfill our ethical and legal obligations, as recommended by the American Medical Association's Code of Ethics, I have informed the patient of my clinical impression; the nature and purpose of the treatment or procedure; the risks, benefits, and possible complications of the intervention; the alternatives, including doing nothing; the risk(s) and benefit(s) of the alternative treatment(s) or procedure(s); and the risk(s) and benefit(s) of doing nothing. The patient was provided information about the general risks and possible complications associated with the procedure. These may include, but are not limited to: failure to achieve desired goals, infection, bleeding, organ or nerve damage, allergic reactions, paralysis, and  death. In addition, the patient was informed of those risks and complications associated to Spine-related procedures, such as failure to decrease pain; infection (i.e.:  Meningitis, epidural or intraspinal abscess); bleeding (i.e.: epidural hematoma, subarachnoid hemorrhage, or any other type of intraspinal or peri-dural bleeding); organ or nerve damage (i.e.: Any type of peripheral nerve, nerve root, or spinal cord injury) with subsequent damage to sensory, motor, and/or autonomic systems, resulting in permanent pain, numbness, and/or weakness of one or several areas of the body; allergic reactions; (i.e.: anaphylactic reaction); and/or death. Furthermore, the patient was informed of those risks and complications associated with the medications. These include, but are not limited to: allergic reactions (i.e.: anaphylactic or anaphylactoid reaction(s)); adrenal axis suppression; blood sugar elevation that in diabetics may result in ketoacidosis or comma; water retention that in patients with history of congestive heart failure may result in shortness of breath, pulmonary edema, and decompensation with resultant heart failure; weight gain; swelling or edema; medication-induced neural toxicity; particulate matter embolism and blood vessel occlusion with resultant organ, and/or nervous system infarction; and/or aseptic necrosis of one or more joints. Finally, the patient was informed that Medicine is not an exact science; therefore, there is also the possibility of unforeseen or unpredictable risks and/or possible complications that may result in a catastrophic outcome. The patient indicated having understood very clearly. We have given the patient no guarantees and we have made no promises. Enough time was given to the patient to ask questions, all of which were answered to the patient's satisfaction. Arthur Romero has indicated that he wanted to continue with the procedure. Attestation: I, the ordering provider,  attest that I have discussed with the patient the benefits, risks, side-effects, alternatives, likelihood of achieving goals, and potential problems during recovery for the procedure that I have provided informed consent. Date  Time: 08/25/2021 10:58 AM  Pre-Procedure Preparation:  Monitoring: As per clinic protocol. Respiration, ETCO2, SpO2, BP, heart rate and rhythm monitor placed and checked for adequate function Safety Precautions: Patient was assessed for positional comfort and pressure points before starting the procedure. Time-out: I initiated and conducted the "Time-out" before starting the procedure, as per protocol. The patient was asked to participate by confirming the accuracy of the "Time Out" information. Verification of the correct person, site, and procedure were performed and confirmed by me, the nursing staff, and the patient. "Time-out" conducted as per Joint Commission's Universal Protocol (UP.01.01.01). Time: 1128  Description of Procedure:          Laterality: Bilateral. The procedure was performed in identical fashion on both sides. Levels:  L3, L4, L5, Medial Branch Level(s) Area Prepped: Posterior Lumbosacral Region DuraPrep (Iodine Povacrylex [0.7% available iodine] and Isopropyl Alcohol, 74% w/w) Safety Precautions: Aspiration looking for blood return was conducted prior to all injections. At no point did we inject any substances, as a needle was being advanced. Before injecting, the patient was told to immediately notify me if he was experiencing any new onset of "ringing in the ears, or metallic taste in the mouth". No attempts were made at seeking any paresthesias. Safe injection practices and needle disposal techniques used. Medications properly checked for expiration dates. SDV (single dose vial) medications used. After the completion of the procedure, all disposable equipment used was discarded in the proper designated medical waste containers. Local Anesthesia:  Protocol guidelines were followed. The patient was positioned over the fluoroscopy table. The area was prepped in the usual manner. The time-out was completed. The target area was identified using fluoroscopy. A 12-in long, straight, sterile hemostat was used with fluoroscopic guidance to locate the targets for each level blocked. Once located, the skin was  marked with an approved surgical skin marker. Once all sites were marked, the skin (epidermis, dermis, and hypodermis), as well as deeper tissues (fat, connective tissue and muscle) were infiltrated with a small amount of a short-acting local anesthetic, loaded on a 10cc syringe with a 25G, 1.5-in  Needle. An appropriate amount of time was allowed for local anesthetics to take effect before proceeding to the next step. Local Anesthetic: Lidocaine 2.0% The unused portion of the local anesthetic was discarded in the proper designated containers. Technical explanation of process:  L3 Medial Branch Nerve Block (MBB): The target area for the L3 medial branch is at the junction of the postero-lateral aspect of the superior articular process and the superior, posterior, and medial edge of the transverse process of L4. Under fluoroscopic guidance, a Quincke needle was inserted until contact was made with os over the superior postero-lateral aspect of the pedicular shadow (target area). After negative aspiration for blood, 34mL of the nerve block solution was injected without difficulty or complication. The needle was removed intact. L4 Medial Branch Nerve Block (MBB): The target area for the L4 medial branch is at the junction of the postero-lateral aspect of the superior articular process and the superior, posterior, and medial edge of the transverse process of L5. Under fluoroscopic guidance, a Quincke needle was inserted until contact was made with os over the superior postero-lateral aspect of the pedicular shadow (target area). After negative aspiration for  blood, 62mL of the nerve block solution was injected without difficulty or complication. The needle was removed intact. L5 Medial Branch Nerve Block (MBB): The target area for the L5 medial branch is at the junction of the postero-lateral aspect of the superior articular process and the superior, posterior, and medial edge of the sacral ala. Under fluoroscopic guidance, a Quincke needle was inserted until contact was made with os over the superior postero-lateral aspect of the pedicular shadow (target area). After negative aspiration for blood, 86mL of the nerve block solution was injected without difficulty or complication. The needle was removed intact.   Nerve block solution: 12 cc solution made of 10 cc of 0.2% ropivacaine, 2 cc of Decadron 10 mg/cc. 2 cc injected at each level above bilaterally  Procedural Needles: 22-gauge, 3.5-inch, Quincke needles used for all levels.  Once the entire procedure was completed, the treated area was cleaned, making sure to leave some of the prepping solution back to take advantage of its long term bactericidal properties.      Illustration of the posterior view of the lumbar spine and the posterior neural structures. Laminae of L2 through S1 are labeled. DPRL5, dorsal primary ramus of L5; DPRS1, dorsal primary ramus of S1; DPR3, dorsal primary ramus of L3; FJ, facet (zygapophyseal) joint L3-L4; I, inferior articular process of L4; LB1, lateral branch of dorsal primary ramus of L1; IAB, inferior articular branches from L3 medial branch (supplies L4-L5 facet joint); IBP, intermediate branch plexus; MB3, medial branch of dorsal primary ramus of L3; NR3, third lumbar nerve root; S, superior articular process of L5; SAB, superior articular branches from L4 (supplies L4-5 facet joint also); TP3, transverse process of L3.  Vitals:   08/25/21 1102 08/25/21 1127 08/25/21 1132 08/25/21 1138  BP: 128/71 129/71 134/67 119/75  Pulse: 72     Resp: 16 18 (!) 21 18  Temp:  (!) 97.1 F (36.2 C)     TempSrc: Temporal     SpO2: 100% 98% 95% 97%  Weight: 173 lb (78.5 kg)  Height: 6\' 3"  (1.905 m)        Start Time: 1128 hrs. End Time: 1137 hrs.  Imaging Guidance (Spinal):          Type of Imaging Technique: Fluoroscopy Guidance (Spinal) Indication(s): Assistance in needle guidance and placement for procedures requiring needle placement in or near specific anatomical locations not easily accessible without such assistance. Exposure Time: Please see nurses notes. Contrast: None used. Fluoroscopic Guidance: I was personally present during the use of fluoroscopy. "Tunnel Vision Technique" used to obtain the best possible view of the target area. Parallax error corrected before commencing the procedure. "Direction-depth-direction" technique used to introduce the needle under continuous pulsed fluoroscopy. Once target was reached, antero-posterior, oblique, and lateral fluoroscopic projection used confirm needle placement in all planes. Images permanently stored in EMR. Interpretation: No contrast injected. I personally interpreted the imaging intraoperatively. Adequate needle placement confirmed in multiple planes. Permanent images saved into the patient's record.   Post-operative Assessment:  Post-procedure Vital Signs:  Pulse/HCG Rate: 7273 Temp:  (!) 97.1 F (36.2 C) Resp: 18 BP:  119/75 SpO2: 97 %  EBL: None  Complications: No immediate post-treatment complications observed by team, or reported by patient.  Note: The patient tolerated the entire procedure well. A repeat set of vitals were taken after the procedure and the patient was kept under observation following institutional policy, for this type of procedure. Post-procedural neurological assessment was performed, showing return to baseline, prior to discharge. The patient was provided with post-procedure discharge instructions, including a section on how to identify potential problems. Should any  problems arise concerning this procedure, the patient was given instructions to immediately contact us, at any time, without hesitation. In any case, we plan to contact the patient by telephone for a follow-up status report regarding this interventional procedure.  Comments:  No additional relevant information.  Plan of Care  Orders:  Orders Placed This Encounter  Procedures   DG PAIN [facility] C-ARM 1-60 MIN NO REPORT    Intraoperative interpretation by procedural physician at [place].    Standing Status:   Standing    Number of Occurrences:   1    Order Specific Question:   Reason for exam:    Answer:   Assistance in needle guidance and placement for procedures requiring needle placement in or near specific anatomical locations not easily accessible without such assistance.     Medications ordered for procedure: Meds ordered this encounter  Medications   lidocaine (XYLOCAINE) 2 % (with pres) injection 400 mg   dexamethasone (DECADRON) injection 10 mg   dexamethasone (DECADRON) injection 10 mg   ropivacaine (PF) 2 mg/mL (0.2%) (NAROPIN) injection 9 mL   ropivacaine (PF) 2 mg/mL (0.2%) (NAROPIN) injection 9 mL    Medications administered: We administered lidocaine, dexamethasone, dexamethasone, ropivacaine (PF) 2 mg/mL (0.2%), and ropivacaine (PF) 2 mg/mL (0.2%).  See the medical record for exact dosing, route, and time of administration.  Ok to restart Xarelto tomorrow  Follow-up plan:   Return in about 4 weeks (around 09/22/2021) for Post Procedure Evaluation, virtual.       Bilateral L3,4,5 Fct Block #1 07/14/21, #2 08/25/21  Recent Visits Date Type Provider Dept  08/11/21 Office Visit Gillis Santa, MD [facility]  07/14/21 Procedure visit Gillis Santa, MD [facility]  07/07/21 Office Visit Gillis Santa, MD [facility]  Showing recent visits within past 90 days and meeting all other requirements Today's Visits Date Type  Provider Dept  08/25/21  Procedure visit Gillis Santa, MD [facility]  Showing today's visits and meeting all other requirements Future Appointments No visits were found meeting these conditions. Showing future appointments within next 90 days and meeting all other requirements Disposition: Discharge home  Discharge (Date  Time): 08/25/2021; 1155 hrs.   Primary Care Physician: Olin Hauser, DO Location: [facility] Note by: Gillis Santa, MD Date: 08/25/2021; Time: 11:40 AM  Disclaimer:  Medicine is not an exact science. The only guarantee in medicine is that nothing is guaranteed. It is important to note that the decision to proceed with this intervention was based on the information collected from the patient. The Data and conclusions were drawn from the patient's questionnaire, the interview, and the physical examination. Because the information was provided in large part by the patient, it cannot be guaranteed that it has not been purposely or unconsciously manipulated. Every effort has been made to obtain as much relevant data as possible for this evaluation. It is important to note that the conclusions that lead to this procedure are derived in large part from the available data. Always take into account that the treatment will also be dependent on availability of resources and existing treatment guidelines, considered by other Pain Management Practitioners as being common knowledge and practice, at the time of the intervention. For Medico-Legal purposes, it is also important to point out that variation in procedural techniques and pharmacological choices are the acceptable norm. The indications, contraindications, technique, and results of the above procedure should only be interpreted and judged by a Board-Certified Interventional Pain Specialist with extensive familiarity and expertise in the same exact procedure and technique.

## 2021-08-25 NOTE — Progress Notes (Signed)
Safety precautions to be maintained throughout the outpatient stay will include: orient to surroundings, keep bed in low position, maintain call bell within reach at all times, provide assistance with transfer out of bed and ambulation.  

## 2021-08-25 NOTE — Patient Instructions (Addendum)
____________________________________________________________________________________________ ° °Post-Procedure Discharge Instructions ° °Instructions: °Apply ice:  °Purpose: This will minimize any swelling and discomfort after procedure.  °When: Day of procedure, as soon as you get home. °How: Fill a plastic sandwich bag with crushed ice. Cover it with a small towel and apply to injection site. °How long: (15 min on, 15 min off) Apply for 15 minutes then remove x 15 minutes.  Repeat sequence on day of procedure, until you go to bed. °Apply heat:  °Purpose: To treat any soreness and discomfort from the procedure. °When: Starting the next day after the procedure. °How: Apply heat to procedure site starting the day following the procedure. °How long: May continue to repeat daily, until discomfort goes away. °Food intake: Start with clear liquids (like water) and advance to regular food, as tolerated.  °Physical activities: Keep activities to a minimum for the first 8 hours after the procedure. After that, then as tolerated. °Driving: If you have received any sedation, be responsible and do not drive. You are not allowed to drive for 24 hours after having sedation. °Blood thinner: (Applies only to those taking blood thinners) You may restart your blood thinner 6 hours after your procedure. °Insulin: (Applies only to Diabetic patients taking insulin) As soon as you can eat, you may resume your normal dosing schedule. °Infection prevention: Keep procedure site clean and dry. Shower daily and clean area with soap and water. °Post-procedure Pain Diary: Extremely important that this be done correctly and accurately. Recorded information will be used to determine the next step in treatment. For the purpose of accuracy, follow these rules: °Evaluate only the area treated. Do not report or include pain from an untreated area. For the purpose of this evaluation, ignore all other areas of pain, except for the treated area. °After  your procedure, avoid taking a long nap and attempting to complete the pain diary after you wake up. Instead, set your alarm clock to go off every hour, on the hour, for the initial 8 hours after the procedure. Document the duration of the numbing medicine, and the relief you are getting from it. °Do not go to sleep and attempt to complete it later. It will not be accurate. If you received sedation, it is likely that you were given a medication that may cause amnesia. Because of this, completing the diary at a later time may cause the information to be inaccurate. This information is needed to plan your care. °Follow-up appointment: Keep your post-procedure follow-up evaluation appointment after the procedure (usually 2 weeks for most procedures, 6 weeks for radiofrequencies). DO NOT FORGET to bring you pain diary with you.  ° °Expect: (What should I expect to see with my procedure?) °From numbing medicine (AKA: Local Anesthetics): Numbness or decrease in pain. You may also experience some weakness, which if present, could last for the duration of the local anesthetic. °Onset: Full effect within 15 minutes of injected. °Duration: It will depend on the type of local anesthetic used. On the average, 1 to 8 hours.  °From steroids (Applies only if steroids were used): Decrease in swelling or inflammation. Once inflammation is improved, relief of the pain will follow. °Onset of benefits: Depends on the amount of swelling present. The more swelling, the longer it will take for the benefits to be seen. In some cases, up to 10 days. °Duration: Steroids will stay in the system x 2 weeks. Duration of benefits will depend on multiple posibilities including persistent irritating factors. °Side-effects: If present, they   may typically last 2 weeks (the duration of the steroids). °Frequent: Cramps (if they occur, drink Gatorade and take over-the-counter Magnesium 450-500 mg once to twice a day); water retention with temporary  weight gain; increases in blood sugar; decreased immune system response; increased appetite. °Occasional: Facial flushing (red, warm cheeks); mood swings; menstrual changes. °Uncommon: Long-term decrease or suppression of natural hormones; bone thinning. (These are more common with higher doses or more frequent use. This is why we prefer that our patients avoid having any injection therapies in other practices.)  °Very Rare: Severe mood changes; psychosis; aseptic necrosis. °From procedure: Some discomfort is to be expected once the numbing medicine wears off. This should be minimal if ice and heat are applied as instructed. ° °Call if: (When should I call?) °You experience numbness and weakness that gets worse with time, as opposed to wearing off. °New onset bowel or bladder incontinence. (Applies only to procedures done in the spine) ° °Emergency Numbers: °Durning business hours (Monday - Thursday, 8:00 AM - 4:00 PM) (Friday, 9:00 AM - 12:00 Noon): [phone] °After hours: [phone] °NOTE: If you are having a problem and are unable connect with, or to talk to a provider, then go to your nearest urgent care or emergency department. If the problem is serious and urgent, please call 911. °____________________________________________________________________________________________ ° Pain Management Discharge Instructions ° °General Discharge Instructions : ° °If you need to reach your doctor call: Monday-Friday 8:00 am - 4:00 pm at [phone] or toll free [phone].  After clinic hours [phone] to have operator reach doctor. ° °Bring all of your medication bottles to all your appointments in the pain clinic. ° °To cancel or reschedule your appointment with Pain Management please remember to call 24 hours in advance to avoid a fee. ° °Refer to the educational materials which you have been given on: General Risks, I had my Procedure. Discharge Instructions, Post Sedation. ° °Post Procedure  Instructions: ° °The drugs you were given will stay in your system until tomorrow, so for the next 24 hours you should not drive, make any legal decisions or drink any alcoholic beverages. ° °You may eat anything you prefer, but it is better to start with liquids then soups and crackers, and gradually work up to solid foods. ° °Please notify your doctor immediately if you have any unusual bleeding, trouble breathing or pain that is not related to your normal pain. ° °Depending on the type of procedure that was done, some parts of your body may feel week and/or numb.  This usually clears up by tonight or the next day. ° °Walk with the use of an assistive device or accompanied by an adult for the 24 hours. ° °You may use ice on the affected area for the first 24 hours.  Put ice in a Ziploc bag and cover with a towel and place against area 15 minutes on 15 minutes off.  You may switch to heat after 24 hours.Facet Blocks °Patient Information ° °Description: The facets are joints in the spine between the vertebrae.  Like any joints in the body, facets can become irritated and painful.  Arthritis can also effect the facets.  By injecting steroids and local anesthetic in and around these joints, we can temporarily block the nerve supply to them.  Steroids act directly on irritated nerves and tissues to reduce selling and inflammation which often leads to decreased pain.  Facet blocks may be done anywhere along the spine from the neck to the   low back depending upon the location of your pain. °  After numbing the skin with local anesthetic (like Novocaine), a small needle is passed onto the facet joints under x-ray guidance.  You may experience a sensation of pressure while this is being done.  The entire block usually lasts about 15-25 minutes.  ° °Conditions which may be treated by facet blocks: ° °Low back/buttock pain °Neck/shoulder pain °Certain types of headaches ° °Preparation for the injection: ° °Do not eat any  solid food or dairy products within 8 hours of your appointment. °You may drink clear liquid up to 3 hours before appointment.  Clear liquids include water, black coffee, juice or soda.  No milk or cream please. °You may take your regular medication, including pain medications, with a sip of water before your appointment.  Diabetics should hold regular insulin (if taken separately) and take 1/2 normal NPH dose the morning of the procedure.  Carry some sugar containing items with you to your appointment. °A driver must accompany you and be prepared to drive you home after your procedure. °Bring all your current medications with you. °An IV may be inserted and sedation may be given at the discretion of the physician. °A blood pressure cuff, EKG and other monitors will often be applied during the procedure.  Some patients may need to have extra oxygen administered for a short period. °You will be asked to provide medical information, including your allergies and medications, prior to the procedure.  We must know immediately if you are taking blood thinners (like Coumadin/Warfarin) or if you are allergic to IV iodine contrast (dye).  We must know if you could possible be pregnant. ° °Possible side-effects: ° °Bleeding from needle site °Infection (rare, may require surgery) °Nerve injury (rare) °Numbness & tingling (temporary) °Difficulty urinating (rare, temporary) °Spinal headache (a headache worse with upright posture) °Light-headedness (temporary) °Pain at injection site (serveral days) °Decreased blood pressure (rare, temporary) °Weakness in arm/leg (temporary) °Pressure sensation in back/neck (temporary) ° ° °Call if you experience: ° °Fever/chills associated with headache or increased back/neck pain °Headache worsened by an upright position °New onset, weakness or numbness of an extremity below the injection site °Hives or difficulty breathing (go to the emergency room) °Inflammation or drainage at the injection  site(s) °Severe back/neck pain greater than usual °New symptoms which are concerning to you ° °Please note: ° °Although the local anesthetic injected can often make your back or neck feel good for several hours after the injection, the pain will likely return. It takes 3-7 days for steroids to work.  You may not notice any pain relief for at least one week. ° °If effective, we will often do a series of 2-3 injections spaced 3-6 weeks apart to maximally decrease your pain.  After the initial series, you may be a candidate for a more permanent nerve block of the facets. ° °If you have any questions, please call [phone] °[facility] °

## 2021-08-26 ENCOUNTER — Telehealth: Payer: Self-pay | Admitting: *Deleted

## 2021-08-26 NOTE — Telephone Encounter (Signed)
No problems post procedure. 

## 2021-09-22 ENCOUNTER — Encounter: Payer: Self-pay | Admitting: Student in an Organized Health Care Education/Training Program

## 2021-09-23 ENCOUNTER — Ambulatory Visit
Payer: Medicare PPO | Attending: Student in an Organized Health Care Education/Training Program | Admitting: Student in an Organized Health Care Education/Training Program

## 2021-09-23 ENCOUNTER — Encounter: Payer: Self-pay | Admitting: Student in an Organized Health Care Education/Training Program

## 2021-09-23 ENCOUNTER — Other Ambulatory Visit: Payer: Self-pay

## 2021-09-23 DIAGNOSIS — G894 Chronic pain syndrome: Secondary | ICD-10-CM | POA: Diagnosis not present

## 2021-09-23 DIAGNOSIS — M47816 Spondylosis without myelopathy or radiculopathy, lumbar region: Secondary | ICD-10-CM | POA: Diagnosis not present

## 2021-09-23 NOTE — Progress Notes (Signed)
Patient: Arthur Romero  Service Category: E/M  Provider: Gillis Santa, MD  DOB: 1941-08-21  DOS: 09/23/2021  Location: Office  MRN: [id number]  Setting: Ambulatory outpatient  Referring Provider: [name] *  Type: Established Patient  Specialty: Interventional Pain Management  PCP: Olin Hauser, DO  Location: Home  Delivery: TeleHealth     Virtual Encounter - Pain Management PROVIDER NOTE: Information contained herein reflects review and annotations entered in association with encounter. Interpretation of such information and data should be left to medically-trained personnel. Information provided to patient can be located elsewhere in the medical record under "Patient Instructions". Document created using STT-dictation technology, any transcriptional errors that may result from process are unintentional.    Contact & Pharmacy Preferred: [phone] Home: [phone] (home) Mobile: There is no such number on file (mobile). E-mail: No e-mail address on record  [place], [place] [address] Phone: [phone] Fax: [phone]  [place], [place] [address] Phone: [phone] Fax: [phone]   Pre-screening  Mr. Brigance offered "in-person" vs "virtual" encounter. He indicated preferring virtual for this encounter.   Reason COVID-19*  Social distancing based on CDC and AMA recommendations.   I contacted Arthur Romero on 09/23/2021 via telephone.      I clearly identified myself as Gillis Santa, MD. I verified that I was speaking with the correct person using two identifiers (Name: DEJAY KRONK, and date of birth: [birth year]).  Consent I sought verbal advanced consent from Arthur Romero for virtual visit interactions. I informed Mr. Lecrone of possible security and privacy concerns, risks, and  limitations associated with providing "not-in-person" medical evaluation and management services. I also informed Mr. Nale of the availability of "in-person" appointments. Finally, I informed him that there would be a charge for the virtual visit and that he could be  personally, fully or partially, financially responsible for it. Mr. Creppel expressed understanding and agreed to proceed.   Historic Elements   Mr. GIO JANOSKI is a 80 y.o. year old, male patient evaluated today after our last contact on 08/25/2021. Mr. 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 He also  has a past surgical history that includes Cardiac surgery; Tympanoplasty (Right, 1987); vocal cords (1989); Shoulder surgery (Right, 2016); Esophagogastroduodenoscopy (N/A, 07/09/2020); and Colonoscopy (N/A, 07/09/2020). Mr. Parson has a current medication list which includes the following prescription(s): acetaminophen, allopurinol, atorvastatin, cholecalciferol, diltiazem, fluticasone, gabapentin, levothyroxine, melatonin, montelukast, multiple vitamins-minerals, omeprazole, polyethylene glycol, xarelto, and tramadol. He  reports that he has never smoked. He has never used smokeless tobacco. He reports that he does not drink alcohol and does not use drugs. Mr. Marrazzo is allergic to ace inhibitors and prednisone.   HPI  Today, he is being contacted for a post-procedure assessment.   Post-Procedure Evaluation  Procedure(s):    Procedure:          Anesthesia, Analgesia, Anxiolysis:  Type: Lumbar Facet, Medial Branch Block(s) #2  Primary Purpose: Diagnostic Region: Posterolateral Lumbosacral Spine Level: L3, L4, L5, Medial Branch Level(s). Injecting these levels blocks the L3-4, L4-5, lumbar facet joints. Laterality: Bilateral  Type: Local Anesthesia Local Anesthetic: Lidocaine 1-2%    Position: Prone    Indications: 1. Lumbar facet arthropathy   2. Lumbar spondylosis   3.  Chronic pain syndrome    Pain Score: Pre-procedure: 3 /10 Post-procedure: 3 /10   Last dose of Xarelto was 3 days ago.      Anxiolysis: Please see nurses note.  Effectiveness during initial hour after procedure (Ultra-Short Term Relief): 100 %   Local anesthetic used: Long-acting (4-6 hours) Effectiveness: Defined as any analgesic benefit obtained secondary to the administration of local anesthetics. This carries significant diagnostic value as to the etiological location, or anatomical origin, of the pain. Duration of benefit is expected to coincide with the duration of the local anesthetic used.  Effectiveness during initial 4-6 hours after procedure (Short-Term Relief): 50 %   Long-term benefit: Defined as any relief past the pharmacologic duration of the local anesthetics.  Effectiveness past the initial 6 hours after procedure (Long-Term Relief): 90 %   Benefits, current: Defined as benefit present at the time of this evaluation.   Analgesia:  90% Function: Somewhat improved ROM: Somewhat improved   Laboratory Chemistry Profile   Renal Lab Results  Component Value Date   BUN 15 06/02/2021   CREATININE 0.97 06/02/2021   LABCREA 170 16/10/930   BCR NOT APPLICABLE 35/57/3220   GFRAA 97 07/15/2020   GFRNONAA 84 07/15/2020    Hepatic Lab Results  Component Value Date   AST 17 06/02/2021   ALT 15 06/02/2021   ALBUMIN 3.6 07/10/2020   ALKPHOS 80 07/10/2020   LIPASE 29 05/18/2020    Electrolytes Lab Results  Component Value Date   NA 141 06/02/2021   K 4.6 06/02/2021   CL 102 06/02/2021   CALCIUM 9.8 06/02/2021   MG 1.8 07/10/2020   PHOS 4.3 07/10/2020    Bone Lab Results  Component Value Date   VD25OH 59 06/02/2021    Inflammation (CRP: Acute Phase) (ESR: Chronic Phase) Lab Results  Component Value Date   LATICACIDVEN 1.3 05/19/2020         Note: Above Lab results  reviewed.   Assessment  The primary encounter diagnosis was Lumbar facet arthropathy. Diagnoses of Lumbar spondylosis and Chronic pain syndrome were also pertinent to this visit.  Plan of Care   Patient is experiencing pain relief and functional improvement after his second round of lumbar facet medial branch nerve blocks.  He rates his pain relief currently as 90% also endorses improvement in his ability to perform ADLs.  We will continue to monitor his symptoms.  Depending upon if and when his low back pain returns to his previous level, can consider repeating lumbar facet medial branch nerve blocks or trialing radiofrequency ablation.  We will discuss further with patient whenever he reaches out to [place] for increased lower back pain.  For the time being, he is pleased with how he is doing we will monitor his health.   Follow-up plan:   Return if symptoms worsen or fail to improve.     Bilateral L3,4,5 Fct Block #1 07/14/21, #2 08/25/21   Recent Visits Date Type Provider Dept  08/25/21 Procedure visit Gillis Santa, MD [facility]  08/11/21 Office Visit Gillis Santa, MD [facility]  07/14/21 Procedure visit Gillis Santa, MD [facility]  07/07/21 Office Visit Gillis Santa, MD [facility]  Showing recent visits within past 90 days and meeting all other requirements Today's Visits Date Type Provider Dept  09/23/21 Office Visit Gillis Santa, MD [facility]  Showing today's visits and meeting all other requirements Future Appointments No visits were found meeting these conditions. Showing future appointments  within next 90 days and meeting all other requirements I discussed the assessment and treatment plan with the patient. The patient was provided an opportunity to ask questions and all were answered. The patient agreed with the plan and demonstrated an understanding of the instructions.  Patient advised to call back or seek an  in-person evaluation if the symptoms or condition worsens.  Duration of encounter: 40mnutes.  Note by: BGillis Santa MD Date: 09/23/2021; Time: 11:47 AM

## 2021-11-05 ENCOUNTER — Other Ambulatory Visit: Payer: Self-pay

## 2021-11-05 NOTE — Patient Outreach (Signed)
[facility]) Care Management  11/05/2021  NEIMAN ROOTS 06/09/1941 [id number]   Telephone Assessment   Outreach attempt # 1 to patient. No answer. RN CM left HIPAA compliant voicemail message along with contact info.    Plan: RN CM will make outreach attempt to patient within the month of March if no return call from patient.    Enzo Montgomery, RN,BSN,CCM [facility] Telephonic Care Management Coordinator Direct Phone: [phone] Toll Free: [phone] Fax: [phone]

## 2021-11-05 NOTE — Patient Outreach (Signed)
[facility]) Care Management  11/05/2021  Arthur Romero 10-Sep-1941 [id number]   Telephone Assessment Annual Assesmsent   Incomign call form patient returning rn CM call. He vocies that he is doing fairly well. No new isues at preent. He contonues to haveintermittnet issus with nerve pai to fett/backa rea snad has seen severla specialists. Patient continie sto risde in his home alone. He ahs supportive sister who lievs enarby an chkcs on him daily. Deneis any recent falls-using carne. Appeitite remains good and wgt stbale. He denies any RN CM needs or concerns at this time.      Medications Reviewed Today     Reviewed by Hayden Pedro, RN (Registered Nurse) on 11/05/21 at 1451  Med List Status: <None>   Medication Order Taking? Sig Documenting Provider Last Dose Status Informant  acetaminophen (TYLENOL) 500 MG tablet [id number] No Take 500-1,000 mg by mouth every 6 (six) hours as needed for mild pain or fever.  [provider] Taking Active Other  allopurinol (ZYLOPRIM) 300 MG tablet [id number] No TAKE 1 TABLET EVERY DAY Olin Hauser, DO Taking Active   atorvastatin (LIPITOR) 40 MG tablet [id number] No TAKE 1 TABLET EVERY DAY Karamalegos, Devonne Doughty, DO Taking Active   Cholecalciferol (VITAMIN D3 PO) [id number] No Take by mouth daily. [provider] Taking Active   diltiazem (CARDIZEM CD) 180 MG 24 hr capsule [id number] No Take 1 capsule (180 mg total) by mouth daily. Olin Hauser, DO Taking Active   fluticasone (FLONASE) 50 MCG/ACT nasal spray [id number] No Place 2 sprays into both nostrils daily. Use for 4-6 weeks then stop and use seasonally or as needed. Olin Hauser, DO Taking Active   gabapentin (NEURONTIN) 100 MG capsule [id number] No TAKE 1 CAPSULE IN THE MORNING,  AND 2 CAPSULES AT BEDTIME. Olin Hauser, DO Taking Active   levothyroxine (SYNTHROID) 88 MCG tablet [id number] No TAKE 1  TABLET (88 MCG TOTAL) BY MOUTH DAILY. Olin Hauser, DO Taking Active   melatonin 3 MG TABS tablet [id number] No Take 3 mg by mouth at bedtime as needed (sleep). [provider] Taking Active Self  montelukast (SINGULAIR) 10 MG tablet [id number] No Take 1 tablet (10 mg total) by mouth at bedtime. Olin Hauser, DO Taking Active   Multiple Vitamins-Minerals (CENTRUM SILVER 50+MEN PO) [id number] No Take 1 tablet by mouth daily. [provider] Taking Active   omeprazole (PRILOSEC) 40 MG capsule [id number] No Take 1 capsule (40 mg total) by mouth daily. Olin Hauser, DO Taking Active   polyethylene glycol (MIRALAX / GLYCOLAX) 17 g packet [id number] No Take 17 g by mouth in the morning and at bedtime. [provider] Taking Active   traMADol (ULTRAM) 50 MG tablet [id number] No Take 25-50 mg by mouth 2 (two) times daily as needed.  Patient not taking: Reported on 08/25/2021   [provider] Not Taking Active   XARELTO 15 MG TABS tablet [id number] No Take 1 tablet (15 mgtotal) by mouth daily with supper. Olin Hauser, DO Taking Active            Fall Risk 07/07/2021 07/14/2021 08/18/2021 08/25/2021 11/05/2021  Falls in the past year? 1 1 1  0 1  Was there an injury with Fall? 0 0 1 - 1  Was there an injury with Fall? patient fell at [facility] back in the spring,  just a skinned elbow.  was able to get himself up and go on his way.  feels that things my have deteriorated since the fall. - - - -  Fall Risk Category Calculator 1 1 3  - 3  Fall Risk Category Low Low High - High  Patient Fall Risk Level Moderate fall risk - High fall risk Moderate fall risk High fall risk  Patient at Risk for Falls Due to History of fall(s) - History of fall(s);Impaired balance/gait;Medication side effect Impaired balance/gait;Impaired mobility History of fall(s);Impaired balance/gait;Impaired mobility;Medication side effect  Fall risk Follow up  Falls evaluation completed;Education provided - Falls evaluation completed;Education provided Falls evaluation completed;Education provided Falls evaluation completed;Education provided    Depression screen [facility] 2/9 11/05/2021 05/13/2021 03/10/2021 02/19/2021 02/17/2021  Decreased Interest 0 0 1 0 0  Down, Depressed, Hopeless 0 0 0 0 0  PHQ - 2 Score 0 0 1 0 0  Altered sleeping - - 0 0 -  Tired, decreased energy - - 0 0 -  Change in appetite - - 0 0 -  Feeling bad or failure about yourself  - - 0 0 -  Trouble concentrating - - 0 0 -  Moving slowly or fidgety/restless - - 0 0 -  Suicidal thoughts - - 0 0 -  PHQ-9 Score - - 1 0 -  Difficult doing work/chores - - - Not difficult at all -    SDOH Screenings   Alcohol Screen: Low Risk    Last Alcohol Screening Score (AUDIT): 0  Depression (PHQ2-9): Low Risk    PHQ-2 Score: 0  Financial Resource Strain: Low Risk    Difficulty of Paying Living Expenses: Not hard at all  Food Insecurity: No Food Insecurity   Worried About Charity fundraiser in the Last Year: Never true   Ran Out of Food in the Last Year: Never true  Housing: Not on file  Physical Activity: Insufficiently Active   Days of Exercise per Week: 7 days   Minutes of Exercise per Session: 20 min  Social Connections: Not on file  Stress: No Stress Concern Present   Feeling of Stress : Not at all  Tobacco Use: Low Risk    Smoking Tobacco Use: Never   Smokeless Tobacco Use: Never   Passive Exposure: Not on file  Transportation Needs: No Transportation Needs   Lack of Transportation (Medical): No   Lack of Transportation (Non-Medical): No     Care Plan : Fall Risk (Adult)  Updates made by Hayden Pedro, RN since 11/05/2021 12:00 AM     Problem: Fall Risk      Long-Range Goal: Absence of Fall and Fall-Related Injury-Patient will report no falls over the next 90 days Completed 11/05/2021  Start Date: 02/17/2021  Expected End Date: 09/24/2021  Recent Progress: On  track  Priority: High  Note:   Evidence-based guidance:  Assess fall risk using a validated tool when available. Consider balance and gait impairment, muscle weakness, diminished vision or hearing, environmental hazards, presence of urinary or bowel urgency and/or incontinence.  Communicate fall injury risk to interprofessional healthcare team.  Develop a fall prevention plan with the patient and family.  Promote use of personal vision and auditory aids.  Promote reorientation, appropriate sensory stimulation, and routines to decrease risk of fall when changes in mental status are present.  Assess assistance level required for safe and effective self-care; consider referral for home care.  Encourage physical activity, such as performance of self-care at highest level of ability, strength and balance exercise program, and provision of appropriate assistive devices; refer to rehabilitation  therapy.  Refer to community-based fall prevention program where available.  If fall occurs, determine the cause and revise fall injury prevention plan.  Regularly review medication contribution to fall risk; consider risk related to polypharmacy and age.  Refer to pharmacist for consultation when concerns about medications are revealed.  Balance adequate pain management with potential for oversedation.  Provide guidance related to environmental modifications.  Consider supplementation with Vitamin D.   Notes:   02/17/21 RN CM completed fall/safety eval assessment. RNC M instructed on importance of DME usage to prevent falls. RN CM assessed for any safety barriers in the home.  Barriers: Knowledge   02/27/21 RN CM assessed for any acute issues/sxxs. RN CM continued reinforcement and education to pt regarding safety measures  06/16/21 RN CM completed fall/safety eval assessment. RNC M instructed on importance of DME usage to prevent falls. RN CM assessed for any safety barriers in the  home.  08/18/21 RN CM completed fall/safety eval assessment. RNC M instructed on importance of DME usage to prevent falls. RN CM assessed for any safety barriers in the home  4//01/02-VOZDGUYQI due to duplicate care plan and goals    Task: Identify and Manage Contributors to Fall Risk Completed 11/05/2021  Note:   Care Management Activities:    - activities of daily living skills assessed - assistive or adaptive device use encouraged - cognition assessed - fall prevention plan reviewed and updated - medication list reviewed    Notes:     Care Plan : Atrial Fibrillation (Adult)  Updates made by Hayden Pedro, RN since 11/05/2021 12:00 AM     Problem: Dysrhythmia (Atrial Fibrillation)      Long-Range Goal: Heart Rate and Rhythm Monitored and Managed-Patient will report no adverse effects from A-fib over next 90 days Completed 11/05/2021  Start Date: 02/17/2021  Expected End Date: 10/24/2021  Recent Progress: On track  Priority: High  Note:    Notes:  02/17/21 RN CM educated patient on the importance of med adherence. RN CM discussed s/s of stroke and when to seek medical attention. RN CM confirmed pt/caregiver has action plan in place and knows how and when to seek medical attention. RN CM assessed for med adherence and any barriers.  06/16/21 RN CM educated on s/s of abnormal bleeding and what to do.Patient reports nose bleed and MD has lowered dosage.  RNCM assessed for med adherence.   08/18/21 RN CM assessed for any acute issues/concerns. RN CM confirmed MD f/u appt in place. RN CM confirmed pt know what s/s warrant medical attention.  3/47/42-VZDGLOVFI due to duplicate care plan and goals    Task: Alleviate Barriers to Dysrhythmia Management Completed 11/05/2021  Note:   Care Management Activities:    - medication-adherence assessment completed - medication side effects monitored and managed - reassurance provided - screen for functional  limitations reviewed    Notes:     Care Plan : RN Care Manager POC  Updates made by Hayden Pedro, RN since 11/05/2021 12:00 AM     Problem: Chronic Disease Mgmt of Chronic Condition-Falls   Priority: High     Long-Range Goal: Development of POC for Mgmt of Chronic Condition-   Start Date: 11/05/2021  Expected End Date: 11/05/2022  Priority: High  Note:   Current Barriers:  Chronic Disease Management support and education needs related to falls   RNCM Clinical Goal(s):  Patient will verbalize understanding of plan for management of falls as evidenced by safety maintained in the home  take all medications exactly as prescribed and will call provider for medication related questions as evidenced by med adherence/compliance attend all scheduled medical appointments: including specialists as evidenced by completion of MD appts continue to work with RN Care Manager to address care management and care coordination needs related to  falls as evidenced by adherence to CM Team Scheduled appointments through collaboration with RN Care manager, provider, and care team.   Interventions: POC sent to PCP upon initial assessment, quarterly and with any changes in patient's conditions Inter-disciplinary care team collaboration (see longitudinal plan of care) Evaluation of current treatment plan related to  self management and patient's adherence to plan as established by provider   Falls Interventions:  (Status:  New goal.) Long Term Goal Provided written and verbal education re: potential causes of falls and Fall prevention strategies Advised patient of importance of notifying provider of falls Assessed for falls since last encounter Assessed patients knowledge of fall risk prevention secondary to previously provided education Assessed working status of life alert bracelet and patient adherence  Patient Goals/Self-Care Activities: Take all medications as prescribed Attend all  scheduled provider appointments Perform all self care activities independently  Call provider office for new concerns or questions  Patient will report no falls  Follow Up Plan:  Telephone follow up appointment with care management team member scheduled for:  within the month of March The patient has been provided with contact information for the care management team and has been advised to call with any health related questions or concerns.        Plan: RN CM discussed with patient next outreach within the month of March.  Patient agrees to care plan and follow up. RN CM will send quarterly update to PCP.   Enzo Montgomery, RN,BSN,CCM [place] Management Telephonic Care Management Coordinator Direct Phone: [phone] Toll Free: [phone] Fax: [phone]

## 2021-11-10 ENCOUNTER — Ambulatory Visit: Payer: Medicare PPO

## 2021-11-29 ENCOUNTER — Other Ambulatory Visit: Payer: Self-pay | Admitting: Family Medicine

## 2021-11-29 DIAGNOSIS — K219 Gastro-esophageal reflux disease without esophagitis: Secondary | ICD-10-CM

## 2021-12-01 NOTE — Telephone Encounter (Signed)
Requested Prescriptions  Pending Prescriptions Disp Refills   omeprazole (PRILOSEC) 40 MG capsule [Pharmacy Med Name: OMEPRAZOLE 40 MG Capsule Delayed Release] 90 capsule 1    Sig: TAKE 1 CAPSULE EVERY DAY     Gastroenterology: Proton Pump Inhibitors Passed - 11/29/2021  2:23 PM      Passed - Valid encounter within last 12 months    Recent Outpatient Visits          3 months ago Tinnitus of right ear   [place], DO   5 months ago Annual physical exam   [place], DO   6 months ago Paroxysmal atrial fibrillation [facility])   [facility] Olin Hauser, DO   8 months ago Acute non-recurrent frontal sinusitis   [place], DO   9 months ago Acute non-recurrent frontal sinusitis   [facility] [place], Devonne Doughty, DO      Future Appointments            In 1 week [place], Devonne Doughty, [facility], [place]   In 5 months  [facility], [place]

## 2021-12-08 ENCOUNTER — Ambulatory Visit (INDEPENDENT_AMBULATORY_CARE_PROVIDER_SITE_OTHER): Payer: Medicare PPO | Admitting: Family Medicine

## 2021-12-08 ENCOUNTER — Other Ambulatory Visit: Payer: Self-pay

## 2021-12-08 VITALS — BP 132/50 | HR 71 | Ht 75.0 in | Wt 180.2 lb

## 2021-12-08 DIAGNOSIS — R42 Dizziness and giddiness: Secondary | ICD-10-CM | POA: Diagnosis not present

## 2021-12-08 DIAGNOSIS — G589 Mononeuropathy, unspecified: Secondary | ICD-10-CM | POA: Diagnosis not present

## 2021-12-08 DIAGNOSIS — G608 Other hereditary and idiopathic neuropathies: Secondary | ICD-10-CM

## 2021-12-08 DIAGNOSIS — M5416 Radiculopathy, lumbar region: Secondary | ICD-10-CM

## 2021-12-08 DIAGNOSIS — F411 Generalized anxiety disorder: Secondary | ICD-10-CM

## 2021-12-08 DIAGNOSIS — T887XXA Unspecified adverse effect of drug or medicament, initial encounter: Secondary | ICD-10-CM

## 2021-12-08 MED ORDER — CLONAZEPAM 0.5 MG PO TABS
0.5000 mg | ORAL_TABLET | Freq: Every evening | ORAL | 1 refills | Status: AC | PRN
Start: 2021-12-08 — End: ?

## 2021-12-08 NOTE — Patient Instructions (Addendum)
Thank you for coming to the office today.  Referral sent.  [facility]   Address: [address] [place], [place] [address] Hours: 8AM-5PM Phone: [phone]  Stop Gabapentin completely.  Start new anxiety medication Clonazepam 0.5mg  - start with HALF pill at night before go to bed. See if it helps you sleep and calms nerves. If works good can take half pill MOST nights. If need whole pill we can increase to 0.5mg   If needed during the day, can also try half or whole pill for anxiety symptoms.  Recommend to start taking Tylenol Arthritis Strength 650mg  you can take TWO for 1300 dose up to TWICE a day for arthritis. If not in pain can scale back to 0 or 1-2 a day   Please schedule a Follow-up Appointment to: Return if symptoms worsen or fail to improve.  If you have any other questions or concerns, please feel free to call the office or send a message through [place]. You may also schedule an earlier appointment if necessary.  Additionally, you may be receiving a survey about your experience at our office within a few days to 1 week by e-mail or mail. We value your feedback.  [name], DO [place]

## 2021-12-08 NOTE — Progress Notes (Signed)
Subjective:    Patient ID: Arthur Romero, male    DOB: 04/17/1941, 81 y.o.   MRN: [id number]  Arthur Romero is a 81 y.o. male presenting on 12/08/2021 for Hypertension and Abdominal Pain   HPI  Tinnitus Chronic Hearing Loss He has upcoming apt with ENT or Audiology for hearing aid. He has had prior procedure on R ear drum with scar tissue, he had a patch on ear drum before. Unsure if anything changed or why worse now. Does not have hearing aid today. It is at home.  Anxiety, generalized Impacting his sleep Daily anxiety worsening, impacts him during day and at night   Dizziness Chronic problem have had difficulty resolving Last visit 08/15/21 [facility] 124m in [facility] and [facility] in [facility]  His Neurologist Dr PMelrose Nakayama4/2022 they advised to increase dose Gabapentin and also Lyrica in future. Asks about other neurology  Lumbar DDD He follows with Spine Specialist / Pain Management Has had spinal injections. He had been on Tramadol, has been off med. He says Pain Management considering a Nerve Ablation but he has not pursued.  Insomnia Difficulty sleeping Taking Gabapentin but not helping sleep Takes Melatonin 3-672mnightly PRN    [facility] 11/05/2021 05/13/2021 03/10/2021  Decreased Interest 0 0 1  Down, Depressed, Hopeless 0 0 0  PHQ - 2 Score 0 0 1  Altered sleeping - - 0  Tired, decreased energy - - 0  Change in appetite - - 0  Feeling bad or failure about yourself  - - 0  Trouble concentrating - - 0  Moving slowly or fidgety/restless - - 0  Suicidal thoughts - - 0  PHQ-9 Score - - 1  Difficult doing work/chores - - -    Social History   Tobacco Use   Smoking status: Never   Smokeless tobacco: Never  Vaping Use   Vaping Use: Never used  Substance Use Topics   Alcohol use: Never    Alcohol/week: 1.0 standard drink    Types: 1 Glasses of wine per week   Drug use: Never    Review of Systems Per HPI unless  specifically indicated above     Objective:    BP (!) 132/50    Pulse 71    Ht _0  (1.905 m)    Wt 180 lb 3.2 oz (81.7 kg)    SpO2 97%    BMI 22.52 kg/m   Wt Readings from Last 3 Encounters:  12/08/21 180 lb 3.2 oz (81.7 kg)  08/25/21 173 lb (78.5 kg)  08/15/21 173 lb (78.5 kg)    Physical Exam Vitals and nursing note reviewed.  Constitutional:      General: He is not in acute distress.    Appearance: Normal appearance. He is well-developed. He is not diaphoretic.     Comments: Well-appearing, comfortable, cooperative  HENT:     Head: Normocephalic and atraumatic.  Eyes:     General:        Right eye: No discharge.        Left eye: No discharge.     Conjunctiva/sclera: Conjunctivae normal.  Cardiovascular:     Rate and Rhythm: Normal rate.  Pulmonary:     Effort: Pulmonary effort is normal.  Skin:    General: Skin is warm and dry.     Findings: No erythema or rash.  Neurological:     Mental Status: He is alert and oriented to person, place, and  time.  Psychiatric:        Mood and Affect: Mood normal.        Behavior: Behavior normal.        Thought Content: Thought content normal.     Comments: Well groomed, good eye contact, normal speech and thoughts   Results for orders placed or performed in visit on 06/02/21  Vitamin B12  Result Value Ref Range   Vitamin B-12 702 200 - 1,100 pg/mL  VITAMIN D 25 Hydroxy (Vit-D Deficiency, Fractures)  Result Value Ref Range   Vit D, 25-Hydroxy 59 30 - 100 ng/mL  TSH  Result Value Ref Range   TSH 1.71 0.40 - 4.50 mIU/L  PSA  Result Value Ref Range   PSA 0.45 < OR = 4.00 ng/mL  Lipid panel  Result Value Ref Range   Cholesterol 131 <200 mg/dL   HDL 62 > OR = 40 mg/dL   Triglycerides 88 <150 mg/dL   LDL Cholesterol (Calc) 52 mg/dL (calc)   Total CHOL/HDL Ratio 2.1 <5.0 (calc)   Non-HDL Cholesterol (Calc) 69 <130 mg/dL (calc)  COMPLETE METABOLIC PANEL WITH GFR  Result Value Ref Range   Glucose, Bld 86 65 - 99 mg/dL    BUN 15 7 - 25 mg/dL   Creat 0.97 0.70 - 1.28 mg/dL   eGFR 80 > OR = 60 mL/min/1.98m   BUN/Creatinine Ratio NOT APPLICABLE 6 - 22 (calc)   Sodium 141 135 - 146 mmol/L   Potassium 4.6 3.5 - 5.3 mmol/L   Chloride 102 98 - 110 mmol/L   CO2 29 20 - 32 mmol/L   Calcium 9.8 8.6 - 10.3 mg/dL   Total Protein 6.9 6.1 - 8.1 g/dL   Albumin 4.4 3.6 - 5.1 g/dL   Globulin 2.5 1.9 - 3.7 g/dL (calc)   AG Ratio 1.8 1.0 - 2.5 (calc)   Total Bilirubin 0.6 0.2 - 1.2 mg/dL   Alkaline phosphatase (APISO) 80 35 - 144 U/L   AST 17 10 - 35 U/L   ALT 15 9 - 46 U/L  CBC with Differential/Platelet  Result Value Ref Range   WBC 9.5 3.8 - 10.8 Thousand/uL   RBC 4.61 4.20 - 5.80 Million/uL   Hemoglobin 14.6 13.2 - 17.1 g/dL   HCT 44.2 38.5 - 50.0 %   MCV 95.9 80.0 - 100.0 fL   MCH 31.7 27.0 - 33.0 pg   MCHC 33.0 32.0 - 36.0 g/dL   RDW 12.9 11.0 - 15.0 %   Platelets 274 140 - 400 Thousand/uL   MPV 9.4 7.5 - 12.5 fL   Neutro Abs 6,299 1,500 - 7,800 cells/uL   Lymphs Abs 2,147 850 - 3,900 cells/uL   Absolute Monocytes 789 200 - 950 cells/uL   Eosinophils Absolute 219 15 - 500 cells/uL   Basophils Absolute 48 0 - 200 cells/uL   Neutrophils Relative % 66.3 %   Total Lymphocyte 22.6 %   Monocytes Relative 8.3 %   Eosinophils Relative 2.3 %   Basophils Relative 0.5 %  Hemoglobin A1c  Result Value Ref Range   Hgb A1c MFr Bld 5.8 (H) <5.7 % of total Hgb   Mean Plasma Glucose 120 mg/dL   eAG (mmol/L) 6.6 mmol/L      Assessment & Plan:   Problem List Items Addressed This Visit     Sensory polyneuropathy   Relevant Medications   clonazePAM (KLONOPIN) 0.5 MG tablet   Other Relevant Orders   Ambulatory referral to Neurology   Mononeuropathy  Relevant Medications   clonazePAM (KLONOPIN) 0.5 MG tablet   Other Relevant Orders   Ambulatory referral to Neurology   Lumbar radiculopathy   Relevant Medications   clonazePAM (KLONOPIN) 0.5 MG tablet   Other Visit Diagnoses     Generalized anxiety  disorder    -  Primary   Relevant Medications   clonazePAM (KLONOPIN) 0.5 MG tablet   Medication side effects       Dizziness       Relevant Orders   Ambulatory referral to Neurology      Dizziness Chronic problem Discussed again similar to prior visit, may be med side effect combine with some possible diminished sympathetic nervous system symptoms Recommend DC gabapentin trial off med to see if it was causing side effect  Stop Gabapentin completely.  Start new anxiety medication Clonazepam 0.44m - start with HALF pill at night before go to bed. See if it helps you sleep and calms nerves. If works good can take half pill MOST nights. If need whole pill we can increase to 0.541m If needed during the day, can also try half or whole pill for anxiety symptoms.  Recommend to start taking Tylenol Arthritis Strength 6505mou can take TWO for 1300 dose up to TWICE a day for arthritis. If not in pain can scale back to 0 or 1-2 a day  referral to Neurology for 2nd opinion, chronic dizziness, unsteadiness, neuropathy. Has had issues with chronic back pain and spinal issues involving neuropathy sensory and then dizziness, he has done gabapentin, nortriptyline but has had side effects on both. He was advised by Neurology may have Parkinsonian features. He does have significant anxiety that impacts his dizziness. Requesting 2nd opinion from new Neurologist to transfer his care, not pleased with previous Neurology.   Orders Placed This Encounter  Procedures   Ambulatory referral to Neurology    Referral Priority:   Routine    Referral Type:   Consultation    Referral Reason:   Specialty Services Required    Requested Specialty:   Neurology    Number of Visits Requested:   1      Meds ordered this encounter  Medications   clonazePAM (KLONOPIN) 0.5 MG tablet    Sig: Take 1 tablet (0.5 mg total) by mouth at bedtime as needed for anxiety (insomnia).    Dispense:  30 tablet    Refill:  1      Follow up plan: Return if symptoms worsen or fail to improve.  AleNobie PutnamO [facility] 12/08/2021, 11:40 AM

## 2021-12-24 ENCOUNTER — Ambulatory Visit: Payer: Self-pay

## 2021-12-24 ENCOUNTER — Other Ambulatory Visit: Payer: Self-pay

## 2021-12-24 NOTE — Telephone Encounter (Signed)
Attempted to call the patient. Left message letting him know that he needs an appointment before we can prescribe any medications.  ?

## 2021-12-24 NOTE — Patient Outreach (Signed)
[facility]) Care Management ? ?12/24/2021 ? ?Arthur Romero ?[birth year] ?[id number] ? ? ?Telephone Assessment ? ? ?Successful outreach call placed to patient. He is doing fairly well. He saw PCP a few wks ago and had some meds adjusted. Patient plans to call MD office to follow up as he does not feel like med working. Denies any issues or concerns at this time. ? ? ?Medications Reviewed Today   ? ? Reviewed by Hayden Pedro, RN (Registered Nurse) on 12/24/21 at 1128  Med List Status: <None>  ? ?Medication Order Taking? Sig Documenting Provider Last Dose Status Informant  ?acetaminophen (TYLENOL) 500 MG tablet [id number] No Take 500-1,000 mg by mouth every 6 (six) hours as needed for mild pain or fever.  [provider] Taking Active Other  ?allopurinol (ZYLOPRIM) 300 MG tablet [id number] No TAKE 1 TABLET EVERY DAY Karamalegos, Devonne Doughty, DO Taking Active   ?atorvastatin (LIPITOR) 40 MG tablet [id number] No TAKE 1 TABLET EVERY DAY Karamalegos, Devonne Doughty, DO Taking Active   ?Cholecalciferol (VITAMIN D3 PO) [id number] No Take by mouth daily. [provider] Taking Active   ?clonazePAM (KLONOPIN) 0.5 MG tablet [id number]  Take 1 tablet (0.5 mg total) by mouth at bedtime as needed for anxiety (insomnia). Olin Hauser, DO  Active   ?diltiazem (CARDIZEM CD) 180 MG 24 hr capsule [id number] No Take 1 capsule (180 mg total) by mouth daily. Olin Hauser, DO Taking Active   ?fluticasone (FLONASE) 50 MCG/ACT nasal spray [id number] No Place 2 sprays into both nostrils daily. Use for 4-6 weeks then stop and use seasonally or as needed. Olin Hauser, DO Taking Active   ?levothyroxine (SYNTHROID) 88 MCG tablet [id number] No TAKE 1 TABLET (88 MCG TOTAL) BY MOUTH DAILY. Olin Hauser, DO Taking Active   ?melatonin 3 MG TABS tablet [id number] No Take 3 mg by mouth at bedtime as needed (sleep). [provider] Taking Active Self   ?montelukast (SINGULAIR) 10 MG tablet [id number] No Take 1 tablet (10 mg total) by mouth at bedtime. Olin Hauser, DO Taking Active   ?Multiple Vitamins-Minerals (CENTRUM SILVER 50+MEN PO) [id number] No Take 1 tablet by mouth daily. [provider] Taking Active   ?omeprazole (PRILOSEC) 40 MG capsule [id number] No TAKE 1 CAPSULE EVERY DAY Karamalegos, Devonne Doughty, DO Taking Active   ?polyethylene glycol (MIRALAX / GLYCOLAX) 17 g packet [id number] No Take 17 g by mouth in the morning and at bedtime. [provider] Taking Active   ?XARELTO 15 MG TABS tablet [id number] No Take 1 tablet (15 mgtotal) by mouth daily with supper. Olin Hauser, DO Taking Active   ? ?  ?  ? ?  ?  ?Care Plan : RN Care Manager POC  ?Updates made by Hayden Pedro, RN since 12/24/2021 12:00 AM  ?  ? ?Problem: Chronic Disease Mgmt of Chronic Condition-Falls   ?Priority: High  ?  ? ?Long-Range Goal: Development of POC for Mgmt of Chronic Condition-   ?Start Date: 11/05/2021  ?Expected End Date: 11/05/2022  ?This Visit's Progress: On track  ?Priority: High  ?Note:   ?Current Barriers:  ?Chronic Disease Management support and education needs related to falls  ? ?RNCM Clinical Goal(s):  ?Patient will verbalize understanding of plan for management of falls as evidenced by safety maintained in the home ?take all medications exactly as prescribed and will call provider for medication related questions as evidenced by med adherence/compliance ?attend all scheduled medical appointments: including specialists as  evidenced by completion of MD appts ?continue to work with RN Care Manager to address care management and care coordination needs related to  falls as evidenced by adherence to CM Team Scheduled appointments through collaboration with RN Care manager, provider, and care team.  ? ?Interventions: ?POC sent to PCP upon initial assessment, quarterly and with any changes in patient's  conditions ?Inter-disciplinary care team collaboration (see longitudinal plan of care) ?Evaluation of current treatment plan related to  self management and patient's adherence to plan as established by provider ? ? ?Falls Interventions:  (Status:  Goal on track:  Yes.) Long Term Goal ?Provided written and verbal education re: potential causes of falls and Fall prevention strategies ?Advised patient of importance of notifying provider of falls ?Assessed for falls since last encounter ?Assessed patients knowledge of fall risk prevention secondary to previously provided education ?Assessed working status of life alert bracelet and patient adherence ? ? ?12/24/2021-Patient reports no further falls. He has been referred to second neuro MD for eval of dizziness.  ? ?Patient Goals/Self-Care Activities: ?Take all medications as prescribed ?Attend all scheduled provider appointments ?Perform all self care activities independently  ?Call provider office for new concerns or questions  ?Patient will report no falls ? ?Follow Up Plan:  Telephone follow up appointment with care management team member scheduled for:  within the month of April ?The patient has been provided with contact information for the care management team and has been advised to call with any health related questions or concerns.  ? ?  ?  ? ?Plan: ?RN CM discussed with patient next outreach within the month of April. Patient agrees to care plan and follow up. ? ?Enzo Montgomery, RN,BSN,CCM ?[facility] Care Management ?Telephonic Care Management Coordinator ?Direct Phone: [phone] ?Toll Free: [phone] ?Fax: [phone] ? ?

## 2021-12-24 NOTE — Telephone Encounter (Signed)
? ?  Chief Complaint: sinus symptoms ?Symptoms: sinus pressure/pain, nasal drainage, sore throat, dizziness ?Frequency: since the weekend ?Pertinent Negatives: Patient denies SOB ?Disposition: [] ED /[] Urgent Care (no appt availability in office) / [] Appointment(In office/virtual)/ []  [facility]/ [] Home Care/ [x] Refused Recommended Disposition /[] [place] [facility]/ []  Follow-up with PCP ?Additional Notes: attempted to schedule an appt but pt states normally Dr. Raliegh Ip will send in medication to pharmacy. Advised him with his symptoms he may want to do a tele visit at first but I would send it over and if him or his nurse had questions they can f/up. Pt agreed.  ? ?Summary: Dizzy + Sinus issues  ? Pt feels dizzy and that his sinuses are bothering him. Does not feel good altogether. Says balance is off as well.  ? ?Best contact: [phone]   ?  ? ?Reason for Disposition ? [1] Sinus pain (not just congestion) AND [2] fever ? ?Answer Assessment - Initial Assessment Questions ?1. LOCATION: "Where does it hurt?"  ?    Forehead and face ?2. ONSET: "When did the sinus pain start?"  (e.g., hours, days)  ?    Over the weekend ?3. SEVERITY: "How bad is the pain?"   (Scale 1-10; mild, moderate or severe) ?  - MILD (1-3): doesn't interfere with normal activities  ?  - MODERATE (4-7): interferes with normal activities (e.g., work or school) or awakens from sleep ?  - SEVERE (8-10): excruciating pain and patient unable to do any normal activities    ?    mild ?5. NASAL CONGESTION: "Is the nose blocked?" If Yes, ask: "Can you open it or must you breathe through your mouth?" ?    yes ?6. NASAL DISCHARGE: "Do you have discharge from your nose?" If so ask, "What color?" ?    Yes clear and thick ?7. FEVER: "Do you have a fever?" If Yes, ask: "What is it, how was it measured, and when did it start?"  ?    No ?8. OTHER SYMPTOMS: "Do you have any other symptoms?" (e.g., sore throat, cough, earache, difficulty breathing) ?     Sore throat and dizziness, feels overall bad ? ?Protocols used: Sinus Pain or Congestion-A-AH ? ?

## 2021-12-26 ENCOUNTER — Ambulatory Visit: Payer: Self-pay

## 2022-01-12 DIAGNOSIS — R404 Transient alteration of awareness: Secondary | ICD-10-CM | POA: Diagnosis not present

## 2022-01-12 DIAGNOSIS — R4020 Unspecified coma: Secondary | ICD-10-CM | POA: Diagnosis not present

## 2022-01-12 DIAGNOSIS — I499 Cardiac arrhythmia, unspecified: Secondary | ICD-10-CM | POA: Diagnosis not present

## 2022-01-12 DIAGNOSIS — I469 Cardiac arrest, cause unspecified: Secondary | ICD-10-CM | POA: Diagnosis not present

## 2022-01-14 ENCOUNTER — Telehealth: Payer: Self-pay | Admitting: Diagnostic Neuroimaging

## 2022-01-14 NOTE — Telephone Encounter (Signed)
Pt's niece called to in form [place] that the pt passed away.  ?

## 2022-01-23 ENCOUNTER — Ambulatory Visit: Payer: Medicare PPO | Admitting: Diagnostic Neuroimaging

## 2022-01-24 DIAGNOSIS — 419620001 Death: Secondary | SNOMED CT | POA: Diagnosis not present

## 2022-01-24 DEATH — deceased

## 2022-02-17 ENCOUNTER — Ambulatory Visit: Payer: Self-pay

## 2022-02-18 ENCOUNTER — Other Ambulatory Visit: Payer: Self-pay

## 2022-02-18 NOTE — Patient Outreach (Signed)
[facility]) Care Management ? ?02/18/2022 ? ?Tristram A Fontenette ?December 03, 1940 ?[id number] ? ? ?Case Closure ? ? ?RN CM received notification that patient deceased.  ? ? ? ?Plan: ?RN CM will close case. ? ?Enzo Montgomery, RN,BSN,CCM ?[facility] Care Management ?Telephonic Care Management Coordinator ?Direct Phone: [phone] ?Toll Free: [phone] ?Fax: [phone] ? ?

## 2022-05-19 ENCOUNTER — Ambulatory Visit: Payer: Medicare PPO
